# Patient Record
Sex: Female | Born: 1945 | Race: White | Hispanic: No | Marital: Married | Smoking: Never smoker
Health system: Southern US, Community
[De-identification: ages and names within clinical notes are randomized; demographics above are authoritative.]

## PROBLEM LIST (undated history)

## (undated) DIAGNOSIS — E039 Hypothyroidism, unspecified: Secondary | ICD-10-CM

## (undated) DIAGNOSIS — M3500 Sicca syndrome, unspecified: Secondary | ICD-10-CM

## (undated) DIAGNOSIS — H269 Unspecified cataract: Secondary | ICD-10-CM

## (undated) DIAGNOSIS — Z8619 Personal history of other infectious and parasitic diseases: Secondary | ICD-10-CM

## (undated) DIAGNOSIS — E785 Hyperlipidemia, unspecified: Secondary | ICD-10-CM

## (undated) DIAGNOSIS — M810 Age-related osteoporosis without current pathological fracture: Secondary | ICD-10-CM

## (undated) DIAGNOSIS — E559 Vitamin D deficiency, unspecified: Secondary | ICD-10-CM

## (undated) DIAGNOSIS — M1990 Unspecified osteoarthritis, unspecified site: Secondary | ICD-10-CM

## (undated) DIAGNOSIS — K5790 Diverticulosis of intestine, part unspecified, without perforation or abscess without bleeding: Secondary | ICD-10-CM

## (undated) DIAGNOSIS — K635 Polyp of colon: Secondary | ICD-10-CM

## (undated) DIAGNOSIS — M797 Fibromyalgia: Secondary | ICD-10-CM

## (undated) HISTORY — DX: Hyperlipidemia, unspecified: E78.5

## (undated) HISTORY — DX: Fibromyalgia: M79.7

## (undated) HISTORY — PX: TONSILLECTOMY AND ADENOIDECTOMY: SHX28

## (undated) HISTORY — DX: Vitamin D deficiency, unspecified: E55.9

## (undated) HISTORY — DX: Age-related osteoporosis without current pathological fracture: M81.0

## (undated) HISTORY — PX: CARPAL TUNNEL RELEASE: SHX101

## (undated) HISTORY — DX: Unspecified cataract: H26.9

## (undated) HISTORY — DX: Unspecified osteoarthritis, unspecified site: M19.90

## (undated) HISTORY — PX: TRIGGER FINGER RELEASE: SHX641

## (undated) HISTORY — DX: Personal history of other infectious and parasitic diseases: Z86.19

## (undated) HISTORY — DX: Sjogren syndrome, unspecified: M35.00

## (undated) HISTORY — DX: Polyp of colon: K63.5

## (undated) HISTORY — PX: BREAST BIOPSY: SHX20

## (undated) HISTORY — DX: Diverticulosis of intestine, part unspecified, without perforation or abscess without bleeding: K57.90

## (undated) HISTORY — DX: Hypothyroidism, unspecified: E03.9

---

## 2015-08-06 DIAGNOSIS — E559 Vitamin D deficiency, unspecified: Secondary | ICD-10-CM | POA: Insufficient documentation

## 2015-08-06 DIAGNOSIS — M810 Age-related osteoporosis without current pathological fracture: Secondary | ICD-10-CM | POA: Insufficient documentation

## 2015-11-06 LAB — HM COLONOSCOPY

## 2016-11-03 LAB — HM DEXA SCAN

## 2017-07-16 DIAGNOSIS — S2249XA Multiple fractures of ribs, unspecified side, initial encounter for closed fracture: Secondary | ICD-10-CM | POA: Insufficient documentation

## 2017-07-17 DIAGNOSIS — S0633AA Contusion and laceration of cerebrum, unspecified, with loss of consciousness status unknown, initial encounter: Secondary | ICD-10-CM | POA: Insufficient documentation

## 2017-07-17 DIAGNOSIS — S270XXA Traumatic pneumothorax, initial encounter: Secondary | ICD-10-CM | POA: Insufficient documentation

## 2017-07-20 DIAGNOSIS — S27321A Contusion of lung, unilateral, initial encounter: Secondary | ICD-10-CM | POA: Insufficient documentation

## 2017-12-22 LAB — HM MAMMOGRAPHY

## 2018-02-03 DIAGNOSIS — G894 Chronic pain syndrome: Secondary | ICD-10-CM | POA: Insufficient documentation

## 2019-02-14 LAB — HM MAMMOGRAPHY

## 2019-02-18 LAB — HM MAMMOGRAPHY

## 2019-06-13 LAB — HEPATIC FUNCTION PANEL
ALT: 8 (ref 7–35)
AST: 12 — AB (ref 13–35)
Alkaline Phosphatase: 68 (ref 25–125)
Bilirubin, Total: 0.5

## 2019-06-13 LAB — TSH: TSH: 2.86 (ref 0.41–5.90)

## 2019-06-13 LAB — BASIC METABOLIC PANEL
BUN: 11 (ref 4–21)
CO2: 30 — AB (ref 13–22)
Chloride: 104 (ref 99–108)
Creatinine: 0.8 (ref 0.5–1.1)
Glucose: 85
Potassium: 4.4 (ref 3.4–5.3)
Sodium: 141 (ref 137–147)

## 2019-06-13 LAB — COMPREHENSIVE METABOLIC PANEL
Calcium: 9.2 (ref 8.7–10.7)
GFR calc non Af Amer: 70

## 2019-06-13 LAB — LIPID PANEL
Cholesterol: 275 — AB (ref 0–200)
HDL: 56 (ref 35–70)
LDL Cholesterol: 171
Triglycerides: 286 — AB (ref 40–160)

## 2019-06-13 LAB — VITAMIN D 25 HYDROXY (VIT D DEFICIENCY, FRACTURES): Vit D, 25-Hydroxy: 56

## 2019-07-12 ENCOUNTER — Encounter: Payer: Self-pay | Admitting: Physician Assistant

## 2019-07-13 ENCOUNTER — Ambulatory Visit (INDEPENDENT_AMBULATORY_CARE_PROVIDER_SITE_OTHER): Payer: Medicare Other | Admitting: Physician Assistant

## 2019-07-13 ENCOUNTER — Other Ambulatory Visit: Payer: Self-pay

## 2019-07-13 ENCOUNTER — Encounter: Payer: Self-pay | Admitting: Physician Assistant

## 2019-07-13 ENCOUNTER — Telehealth: Payer: Self-pay | Admitting: Physician Assistant

## 2019-07-13 VITALS — BP 122/70 | HR 88 | Temp 98.1°F | Ht 61.5 in | Wt 127.2 lb

## 2019-07-13 DIAGNOSIS — N644 Mastodynia: Secondary | ICD-10-CM | POA: Diagnosis not present

## 2019-07-13 DIAGNOSIS — M1990 Unspecified osteoarthritis, unspecified site: Secondary | ICD-10-CM | POA: Insufficient documentation

## 2019-07-13 DIAGNOSIS — E785 Hyperlipidemia, unspecified: Secondary | ICD-10-CM | POA: Insufficient documentation

## 2019-07-13 DIAGNOSIS — E039 Hypothyroidism, unspecified: Secondary | ICD-10-CM | POA: Insufficient documentation

## 2019-07-13 DIAGNOSIS — K5790 Diverticulosis of intestine, part unspecified, without perforation or abscess without bleeding: Secondary | ICD-10-CM | POA: Insufficient documentation

## 2019-07-13 DIAGNOSIS — M810 Age-related osteoporosis without current pathological fracture: Secondary | ICD-10-CM | POA: Insufficient documentation

## 2019-07-13 NOTE — Patient Instructions (Signed)
It was great to see you!  You will be contacted about your referral to the breast center.   I hope to get your labs soon from your primary care doctor and will review them.  Let's follow-up after your breast imaging and figure out our next steps.  Take care,  Inda Coke PA-C

## 2019-07-13 NOTE — Telephone Encounter (Signed)
[place] HIM Dept faxed request for medical records to [facility] at [facility] 07/13/19  [facility]

## 2019-07-13 NOTE — Progress Notes (Signed)
Heather Hodges is a 74 y.o. female here to establish care.  I acted as a Education administrator for [employer], PA-C [employer], [place]  History of Present Illness:   Chief Complaint  Patient presents with  . New Patient (Initial Visit)   L breast pain In 2019, fell off a ladder. Broke ribs, had tension pneumothorax and "slight brain bleed." Has been getting regular mammograms, most recently in Nov 2020. States that since that time she had intermittent issues with breast pain but has increased over the past month. Worsening tenderness to L breast upper outer area with area of increased density (per patient report.) She states that her mother had dx of breast cancer. She also feels like her L nipple has increased in redness. Denies: nipple discharge, palpable lump.  Weight -- Weight: 127 lb 3.2 oz (57.7 kg)    Depression screen [facility] 2/9 07/13/2019  Decreased Interest 0  Down, Depressed, Hopeless 0  PHQ - 2 Score 0    No flowsheet data found.   Other providers/specialists: Patient Care Team: Inda Coke, [place] as PCP - General (Physician Assistant) Celene Squibb., MD as Referring Physician (Neurology)   Past Medical History:  Diagnosis Date  . Arthritis   . Colon polyps   . Diverticulosis   . History of chicken pox   . Hyperlipidemia   . Hypothyroidism   . Osteoporosis   . Vitamin D deficiency      Social History   Socioeconomic History  . Marital status: Married    Spouse name: Not on file  . Number of children: Not on file  . Years of education: Not on file  . Highest education level: Not on file  Occupational History  . Not on file  Tobacco Use  . Smoking status: Never Smoker  . Smokeless tobacco: Never Used  Substance and Sexual Activity  . Alcohol use: Never  . Drug use: Never  . Sexual activity: Not on file  Other Topics Concern  . Not on file  Social History Narrative   Married   Retired -- Aeronautical engineer and nanny   1 son -- lives in [place]   Social  Determinants of Health   Financial Resource Strain:   . Difficulty of Paying Living Expenses:   Food Insecurity:   . Worried About Charity fundraiser in the Last Year:   . Arboriculturist in the Last Year:   Transportation Needs:   . Film/video editor (Medical):   Marland Kitchen Lack of Transportation (Non-Medical):   Physical Activity:   . Days of Exercise per Week:   . Minutes of Exercise per Session:   Stress:   . Feeling of Stress :   Social Connections:   . Frequency of Communication with Friends and Family:   . Frequency of Social Gatherings with Friends and Family:   . Attends Religious Services:   . Active Member of Clubs or Organizations:   . Attends Archivist Meetings:   Marland Kitchen Marital Status:   Intimate Partner Violence:   . Fear of Current or Ex-Partner:   . Emotionally Abused:   Marland Kitchen Physically Abused:   . Sexually Abused:     Past Surgical History:  Procedure Laterality Date  . TONSILLECTOMY AND ADENOIDECTOMY      Family History  Problem Relation Age of Onset  . Arthritis Mother   . Breast cancer Mother   . High Cholesterol Mother   . Heart attack Father   .  High Cholesterol Father   . Arthritis Brother   . Hearing loss Brother   . Hypercholesterolemia Brother     Allergies  Allergen Reactions  . Risedronate Sodium Other (See Comments)    Patient does not remember reaction     Current Medications:   Current Outpatient Medications:  .  Cholecalciferol 50 MCG (2000 UT) TABS, Take by mouth., Disp: , Rfl:  .  gabapentin (NEURONTIN) 100 MG capsule, Take by mouth., Disp: , Rfl:  .  Garlic 123XX123 MG CAPS, Take by mouth., Disp: , Rfl:  .  levothyroxine (SYNTHROID) 75 MCG tablet, Take by mouth., Disp: , Rfl:  .  levothyroxine (SYNTHROID) 88 MCG tablet, Take by mouth., Disp: , Rfl:  .  lidocaine (XYLOCAINE) 4 % external solution, Apply topically., Disp: , Rfl:  .  Lidocaine 4 % PTCH, Place onto the skin., Disp: , Rfl:  .  pravastatin (PRAVACHOL) 40 MG  tablet, Take by mouth., Disp: , Rfl:  .  traMADol (ULTRAM) 50 MG tablet, Take by mouth., Disp: , Rfl:  .  acetaminophen (TYLENOL) 650 MG CR tablet, Take by mouth., Disp: , Rfl:  .  HYDROcodone-acetaminophen (NORCO/VICODIN) 5-325 MG tablet, TAKE 1 TABLET BY MOUTH EVERY 4 HOURS AS NEEDED FOR UP TO 5 DAYS, Disp: , Rfl:    Review of Systems:   ROS  Negative unless otherwise specified per HPI.  Vitals:   Vitals:   07/13/19 1027  BP: 122/70  Pulse: 88  Temp: 98.1 F (36.7 C)  TempSrc: Temporal  SpO2: 94%  Weight: 127 lb 3.2 oz (57.7 kg)  Height: 5' 1.5" (1.562 m)      Body mass index is 23.65 kg/m.  Physical Exam:   Physical Exam Vitals and nursing note reviewed.  Constitutional:      General: She is not in acute distress.    Appearance: She is well-developed. She is not ill-appearing or toxic-appearing.  Cardiovascular:     Rate and Rhythm: Normal rate and regular rhythm.     Pulses: Normal pulses.     Heart sounds: Normal heart sounds, S1 normal and S2 normal.  Pulmonary:     Effort: Pulmonary effort is normal.     Breath sounds: Normal breath sounds.  Chest:     Comments: TTP with area of density to 2 o'clock on L breast, no obvious lump or visible erythema noted to L nipple compared to R nipple Skin:    General: Skin is warm and dry.  Neurological:     Mental Status: She is alert.     GCS: GCS eye subscore is 4. GCS verbal subscore is 5. GCS motor subscore is 6.  Psychiatric:        Speech: Speech normal.        Behavior: Behavior normal. Behavior is cooperative.      Assessment and Plan:   [place] was seen today for new patient (initial visit).  Diagnoses and all orders for this visit:  Breast pain, left -     MM Digital Diagnostic Unilat L; Future   Due to worsening symptoms since mammogram and concern for family history of breast cancer will update her mammogram with a diagnostic mammogram today.  I have put this order in and we will proceed with  imaging.  Patient verbalized understanding and is in agreement to plan.  . Reviewed expectations re: course of current medical issues. . Discussed self-management of symptoms. . Outlined signs and symptoms indicating need for more acute intervention. . Patient  verbalized understanding and all questions were answered. . See orders for this visit as documented in the electronic medical record. . Patient received an After-Visit Summary.  CMA or LPN served as scribe during this visit. History, Physical, and Plan performed by medical provider. The above documentation has been reviewed and is accurate and complete.   Inda Coke, PA-C

## 2019-07-14 ENCOUNTER — Other Ambulatory Visit: Payer: Self-pay | Admitting: Physician Assistant

## 2019-07-14 DIAGNOSIS — N644 Mastodynia: Secondary | ICD-10-CM

## 2019-07-28 NOTE — Telephone Encounter (Signed)
[facility] HIM Dept received medical records from [facility]. Forwarding 257 pages to [place] location 07/28/19  [facility]

## 2019-08-03 ENCOUNTER — Ambulatory Visit
Admission: RE | Admit: 2019-08-03 | Discharge: 2019-08-03 | Disposition: A | Discharge status: Home / Self Care | Payer: Medicare Other | Source: Ambulatory Visit | Attending: Physician Assistant | Admitting: Physician Assistant

## 2019-08-03 ENCOUNTER — Other Ambulatory Visit: Payer: Self-pay

## 2019-08-03 ENCOUNTER — Encounter: Payer: Self-pay | Admitting: Physician Assistant

## 2019-08-03 DIAGNOSIS — N644 Mastodynia: Secondary | ICD-10-CM

## 2019-08-03 LAB — T4, FREE: T4,Free (Direct): 1

## 2019-08-03 IMAGING — US US BREAST*L* LIMITED INC AXILLA
1 series · 7 of 7 positions shown · non-contrast
Comparison: Previous exam(s).

CLINICAL DATA: Patient presents for palpable abnormality within the
upper-outer left breast.

EXAM:
DIGITAL DIAGNOSTIC LEFT MAMMOGRAM WITH CAD AND TOMO
ULTRASOUND LEFT BREAST

[Series 1: us breast*left* limited inc axilla · 0.06mm/px · 7 of 7 slices shown]
[im 1/7]
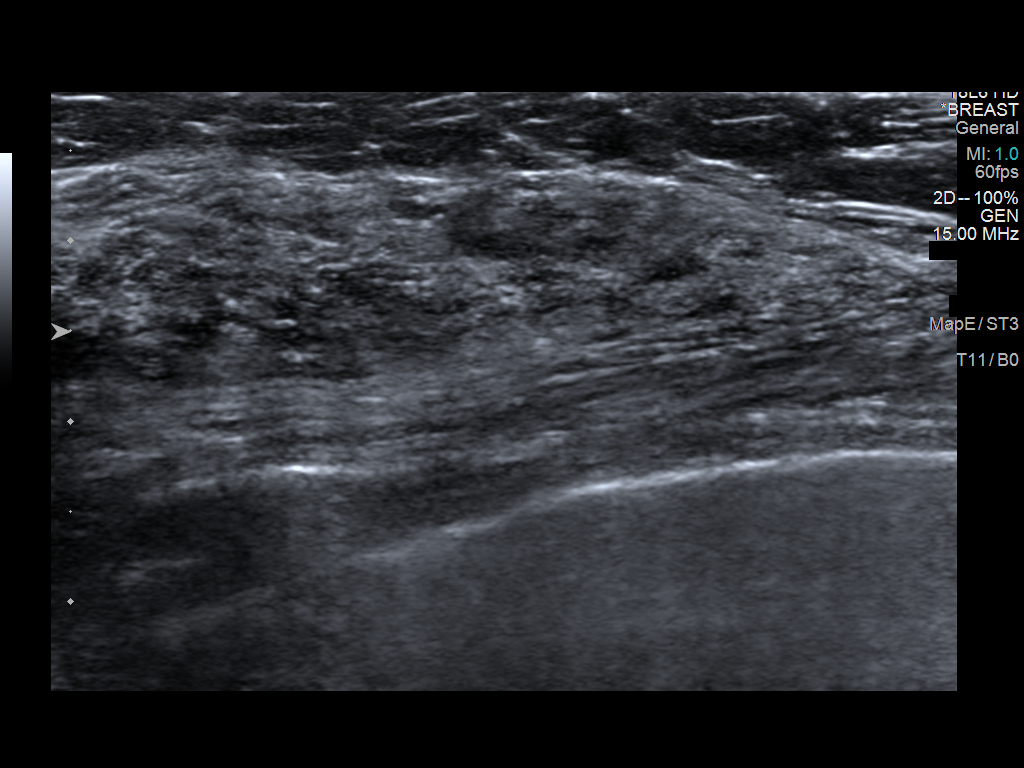
[im 2/7]
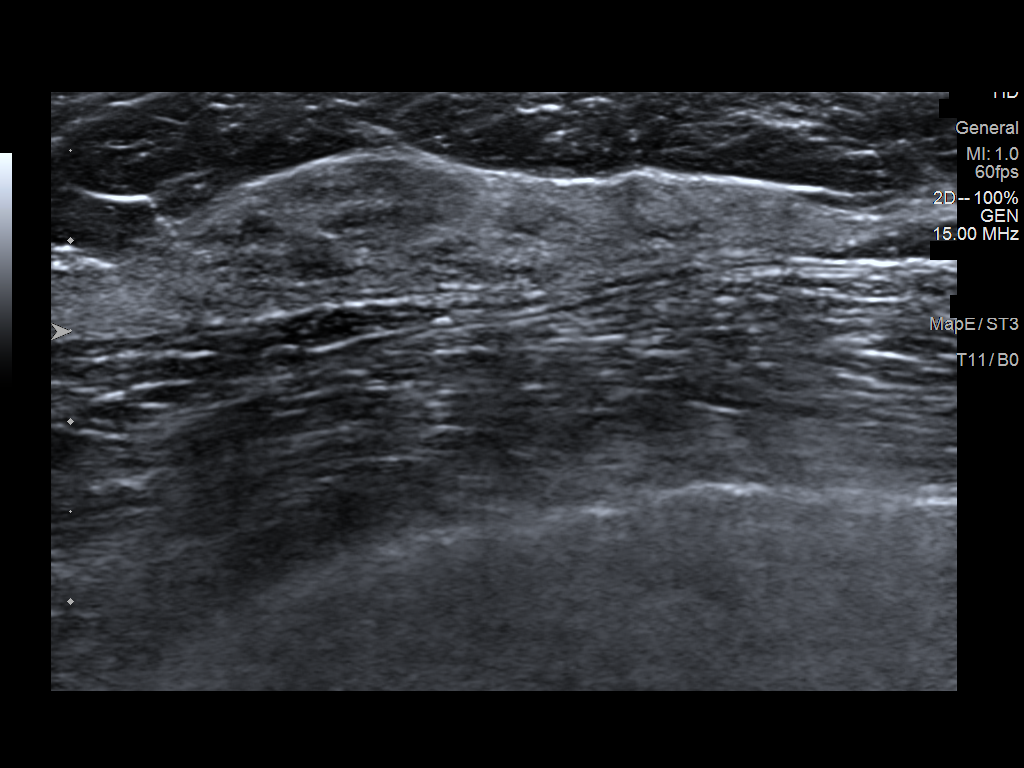
[im 3/7]
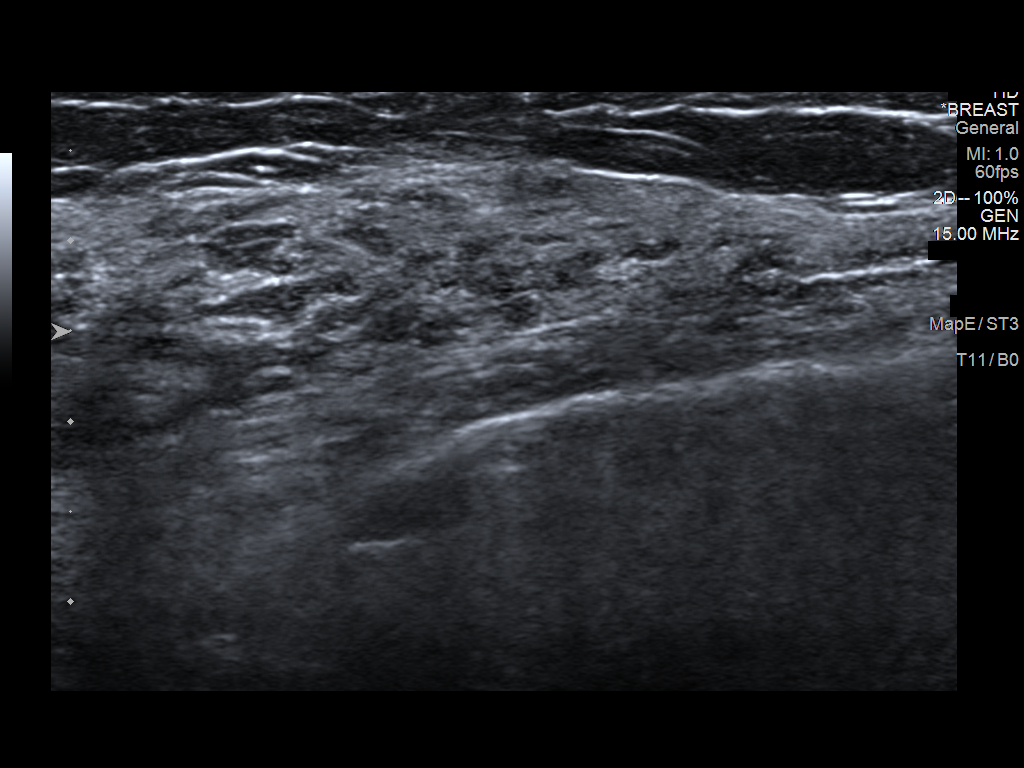
[im 4/7]
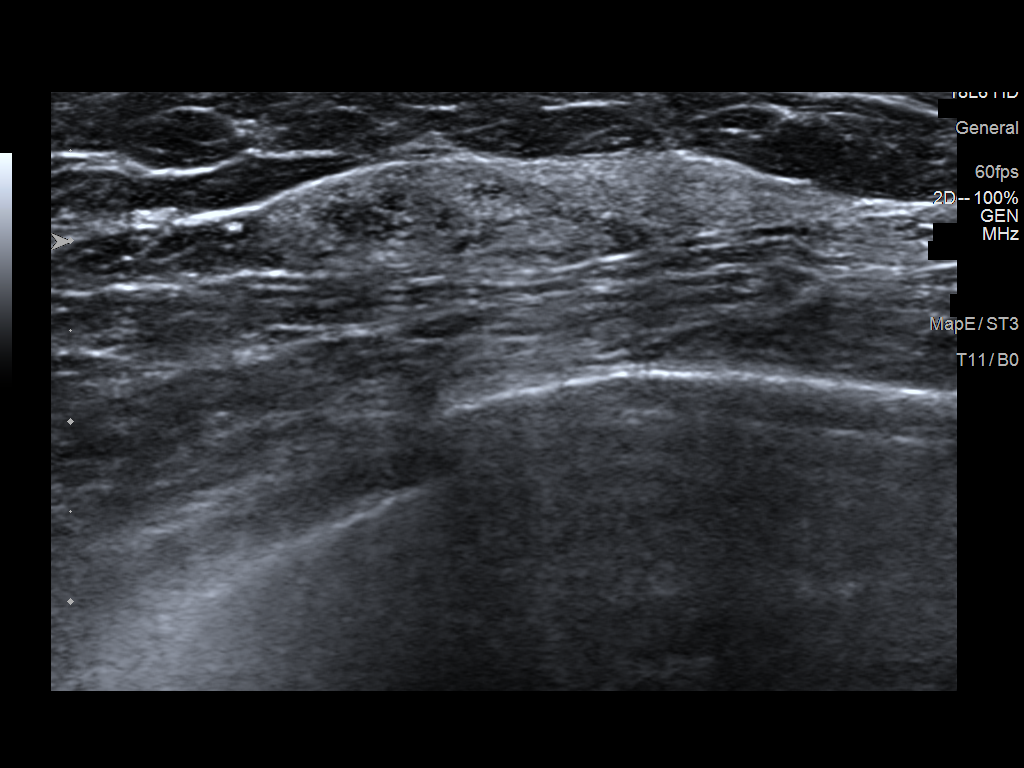
[im 5/7]
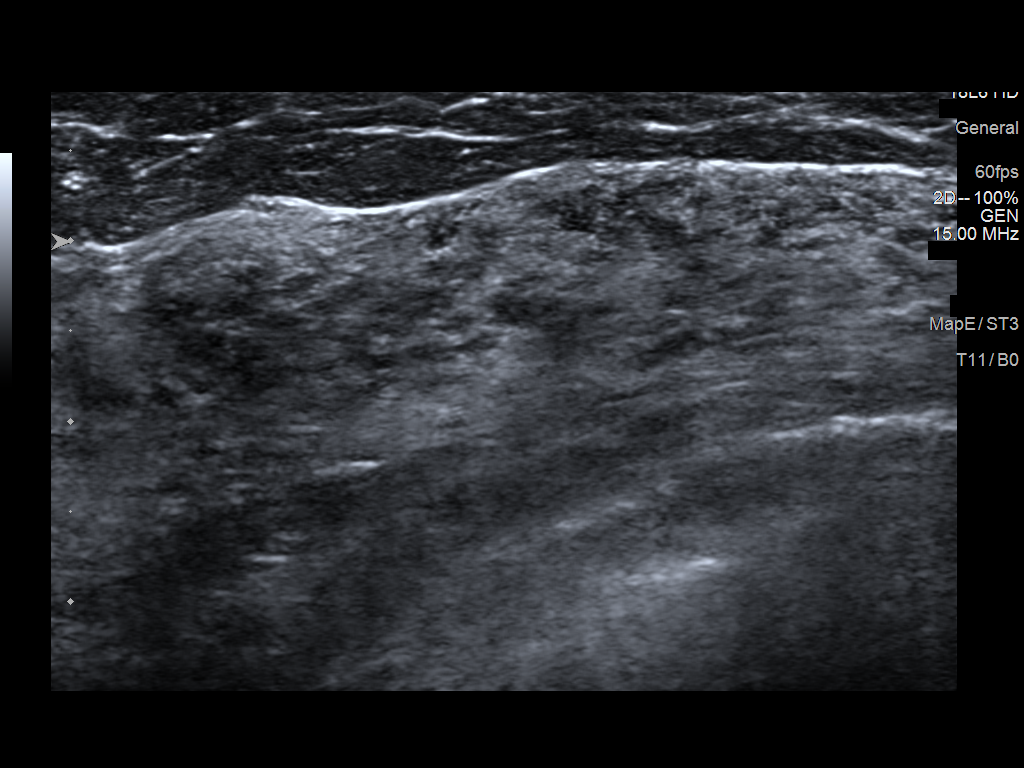
[im 6/7]
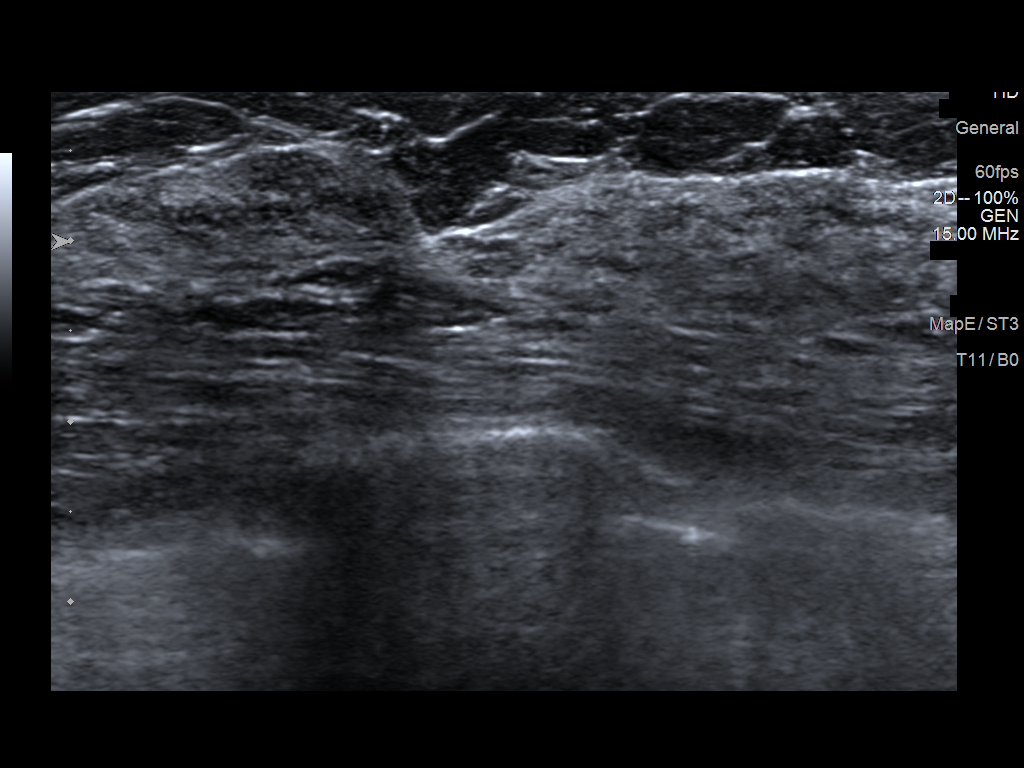
[im 7/7]
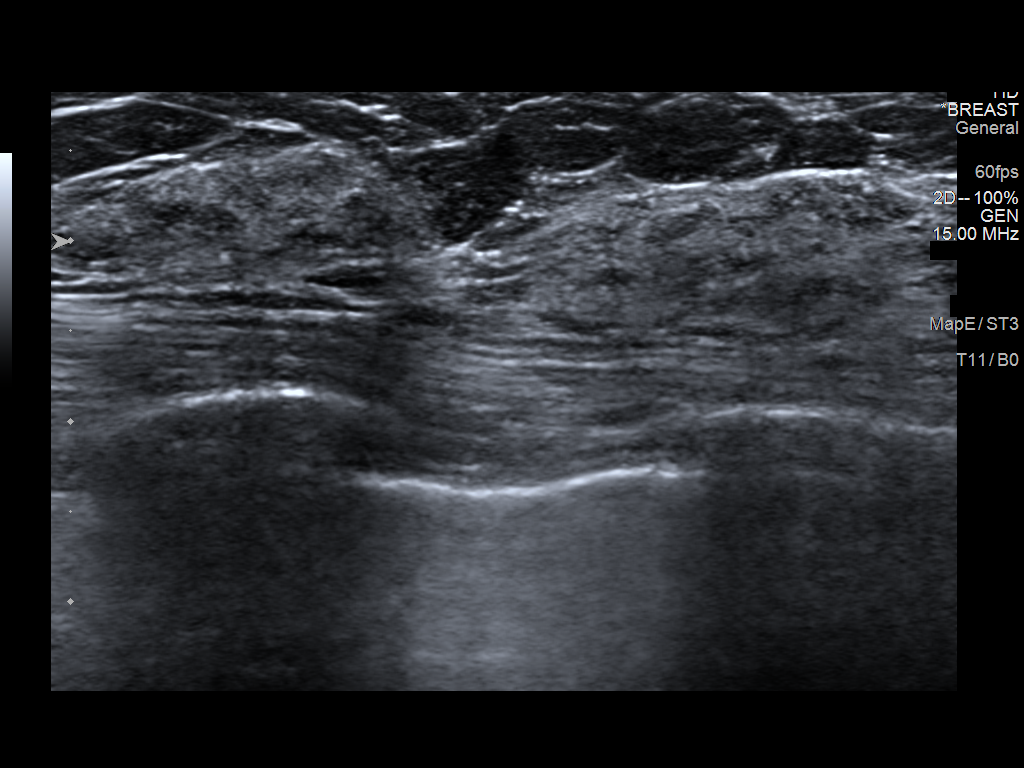

[7 of 7 positions shown; findings below may reference images not displayed]

ACR Breast Density Category c: The breast tissue is heterogeneously
dense, which may obscure small masses.
FINDINGS: No concerning masses, calcifications or nonsurgical distortion
identified within the left breast. No suspicious abnormality
underlying the palpable marker within the upper-outer left breast.

Mammographic images were processed with CAD.

On physical exam, dense tissue is palpated within upper-outer left
breast.

Targeted ultrasound is performed, showing normal dense tissue
without suspicious mass within the left breast 2 o'clock position 7
cm from nipple at the patient reported site of palpable concern.
IMPRESSION: No mammographic evidence for malignancy.

No suspicious abnormality at the site of palpable concern left
breast.

RECOMMENDATION:
Continued clinical evaluation for palpable abnormality left breast.

Return to annual screening mammography [DATE]

I have discussed the findings and recommendations with the patient.
If applicable, a reminder letter will be sent to the patient
regarding the next appointment.

BI-RADS CATEGORY  2: Benign.

## 2019-08-03 IMAGING — MG MM DIGITAL DIAGNOSTIC UNILAT*L* W/ TOMO W/ CAD
6 series · 6 of 18 positions shown · non-contrast
Comparison: Previous exam(s).

CLINICAL DATA: Patient presents for palpable abnormality within the
upper-outer left breast.

EXAM:
DIGITAL DIAGNOSTIC LEFT MAMMOGRAM WITH CAD AND TOMO
ULTRASOUND LEFT BREAST

[L CC synth-2D]
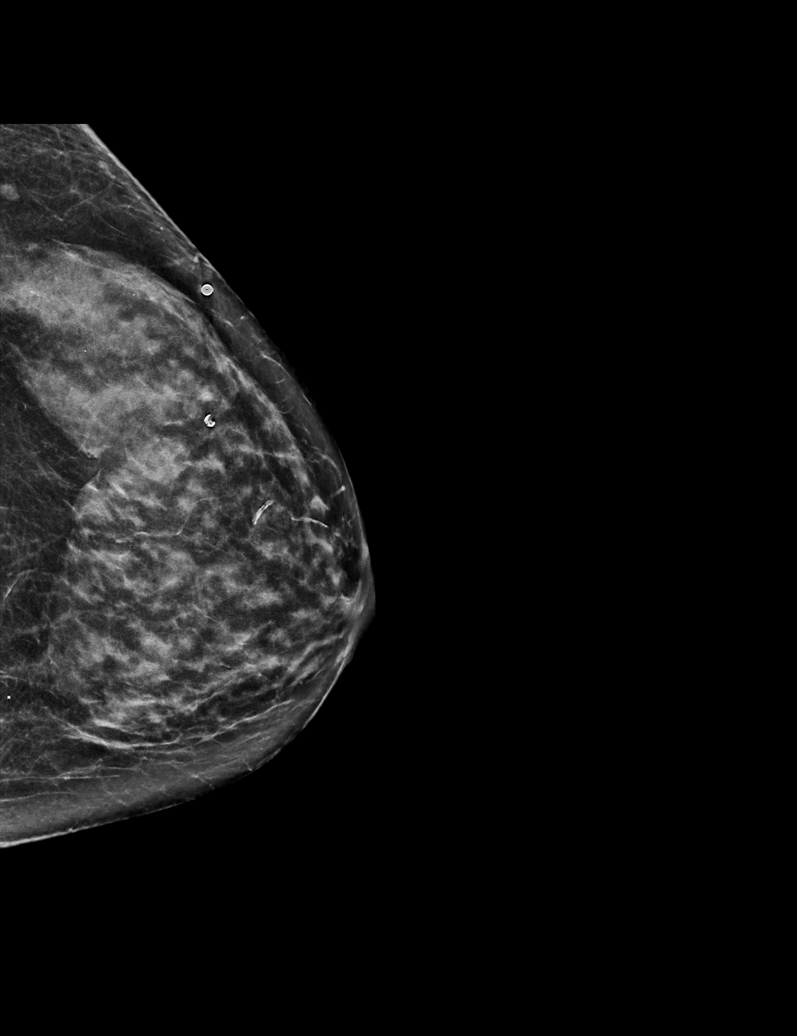

[L MLO synth-2D (1 of 2)]
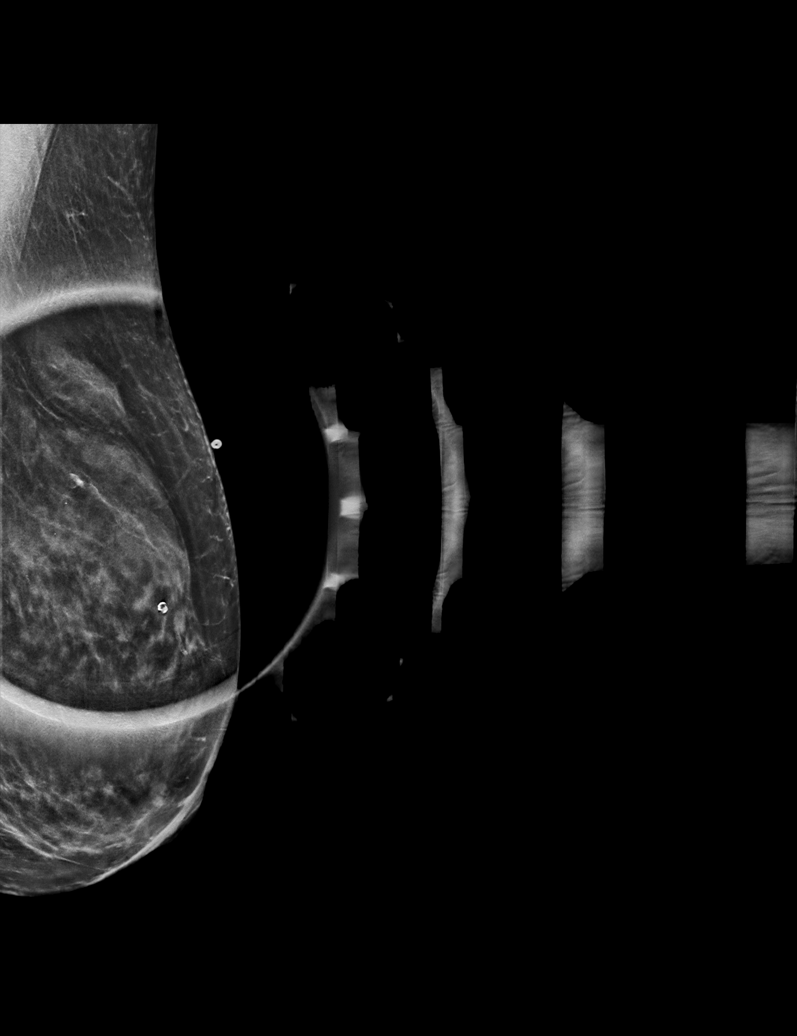

[L MLO synth-2D (2 of 2)]
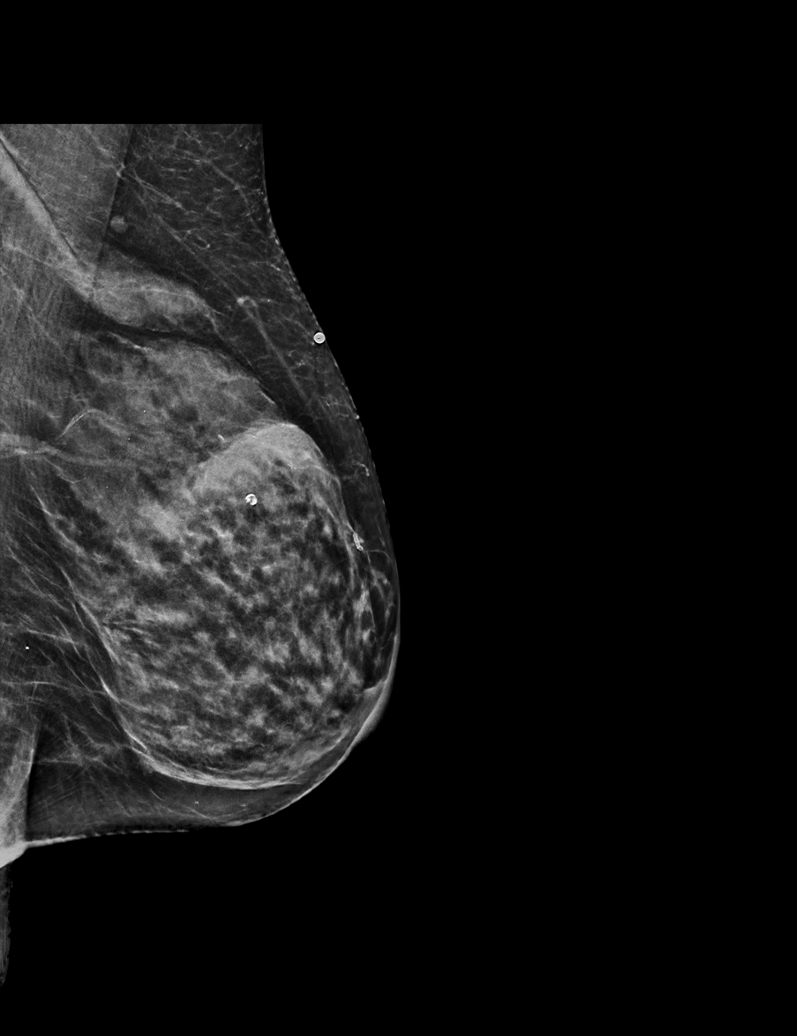

[L CC tomo · tomo slice 27/53.0]
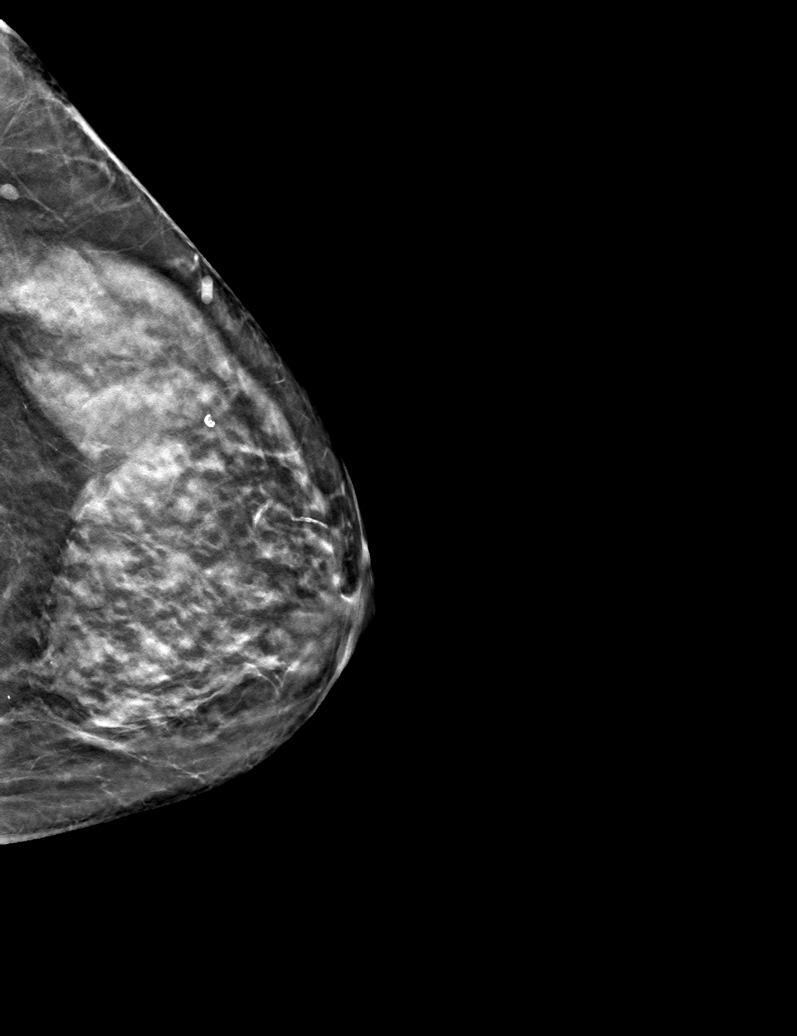

[L MLO tomo (1 of 2) · tomo slice 27/54.0]
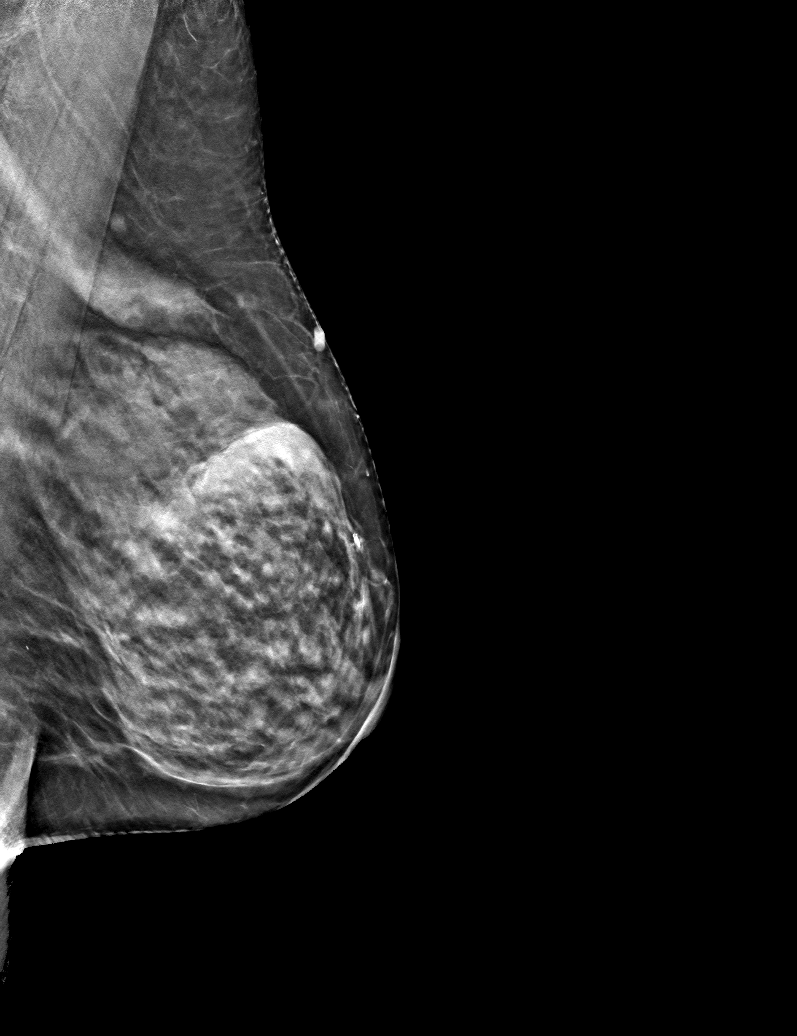

[L MLO tomo (2 of 2) · tomo slice 31/60.0]
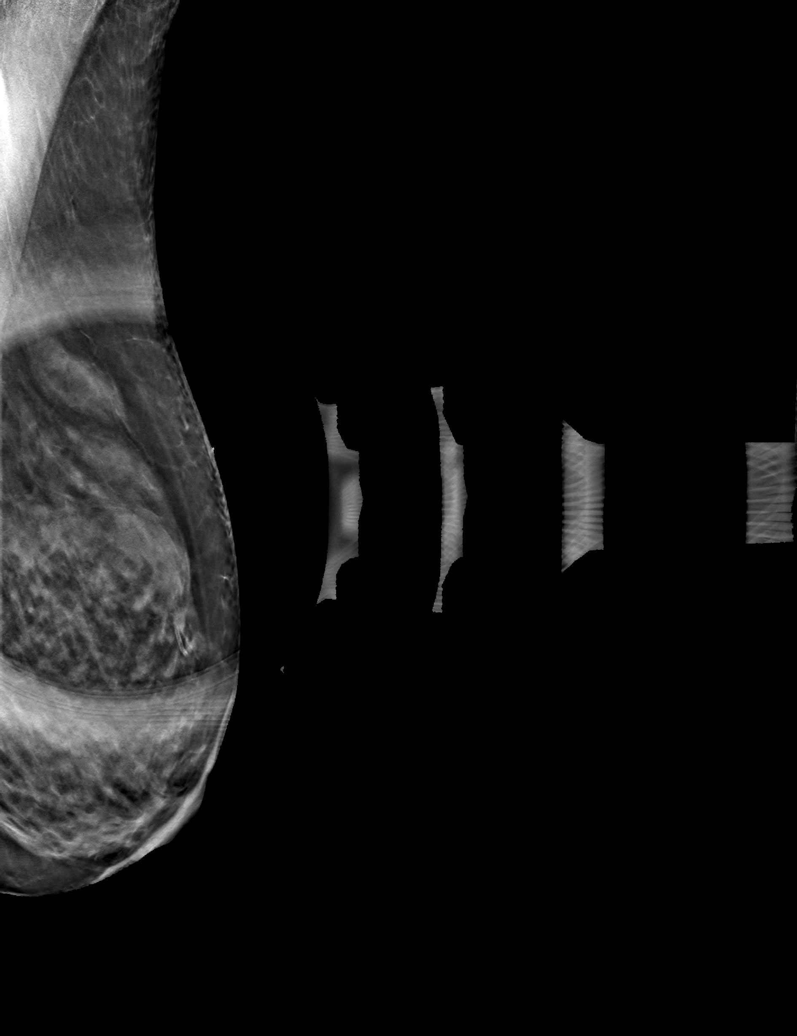

[6 of 18 positions shown; findings below may reference images not displayed]

ACR Breast Density Category c: The breast tissue is heterogeneously
dense, which may obscure small masses.
FINDINGS: No concerning masses, calcifications or nonsurgical distortion
identified within the left breast. No suspicious abnormality
underlying the palpable marker within the upper-outer left breast.

Mammographic images were processed with CAD.

On physical exam, dense tissue is palpated within upper-outer left
breast.

Targeted ultrasound is performed, showing normal dense tissue
without suspicious mass within the left breast 2 o'clock position 7
cm from nipple at the patient reported site of palpable concern.
IMPRESSION: No mammographic evidence for malignancy.

No suspicious abnormality at the site of palpable concern left
breast.

RECOMMENDATION:
Continued clinical evaluation for palpable abnormality left breast.

Return to annual screening mammography [DATE]

I have discussed the findings and recommendations with the patient.
If applicable, a reminder letter will be sent to the patient
regarding the next appointment.

BI-RADS CATEGORY  2: Benign.

## 2019-09-13 ENCOUNTER — Other Ambulatory Visit: Payer: Self-pay

## 2019-09-14 ENCOUNTER — Encounter: Payer: Self-pay | Admitting: Physician Assistant

## 2019-09-14 ENCOUNTER — Ambulatory Visit (INDEPENDENT_AMBULATORY_CARE_PROVIDER_SITE_OTHER): Payer: Medicare Other | Admitting: Physician Assistant

## 2019-09-14 VITALS — BP 110/72 | HR 72 | Temp 97.8°F | Ht 61.5 in | Wt 128.5 lb

## 2019-09-14 DIAGNOSIS — E039 Hypothyroidism, unspecified: Secondary | ICD-10-CM

## 2019-09-14 DIAGNOSIS — R0981 Nasal congestion: Secondary | ICD-10-CM | POA: Diagnosis not present

## 2019-09-14 DIAGNOSIS — E785 Hyperlipidemia, unspecified: Secondary | ICD-10-CM

## 2019-09-14 DIAGNOSIS — M2550 Pain in unspecified joint: Secondary | ICD-10-CM

## 2019-09-14 LAB — T4, FREE: Free T4: 1.04 ng/dL (ref 0.60–1.60)

## 2019-09-14 LAB — TSH: TSH: 2.24 u[IU]/mL (ref 0.35–4.50)

## 2019-09-14 LAB — COMPREHENSIVE METABOLIC PANEL
ALT: 12 U/L (ref 0–35)
AST: 16 U/L (ref 0–37)
Albumin: 4.3 g/dL (ref 3.5–5.2)
Alkaline Phosphatase: 68 U/L (ref 39–117)
BUN: 10 mg/dL (ref 6–23)
CO2: 31 mEq/L (ref 19–32)
Calcium: 9.1 mg/dL (ref 8.4–10.5)
Chloride: 103 mEq/L (ref 96–112)
Creatinine, Ser: 0.7 mg/dL (ref 0.40–1.20)
GFR: 81.52 mL/min (ref >60.00)
Glucose, Bld: 86 mg/dL (ref 70–99)
Potassium: 4.3 mEq/L (ref 3.5–5.1)
Sodium: 139 mEq/L (ref 135–145)
Total Bilirubin: 0.6 mg/dL (ref 0.2–1.2)
Total Protein: 7 g/dL (ref 6.0–8.3)

## 2019-09-14 LAB — LIPID PANEL
Cholesterol: 310 mg/dL — ABNORMAL HIGH (ref 0–200)
HDL: 59.2 mg/dL (ref >39.00)
NonHDL: 250.62
Total CHOL/HDL Ratio: 5
Triglycerides: 243 mg/dL — ABNORMAL HIGH (ref 0.0–149.0)
VLDL: 48.6 mg/dL — ABNORMAL HIGH (ref 0.0–40.0)

## 2019-09-14 LAB — LDL CHOLESTEROL, DIRECT: Direct LDL: 197 mg/dL

## 2019-09-14 NOTE — Patient Instructions (Signed)
It was great to see you!  1. We will call you with your results. I will also mail you the results so you have a record of them. 2. I will refill your thyroid and cholesterol medications after your lab results. 3. Start flonase nasal spray twice a day for your sinuses.  4. Call me when you are ready to see the sports medicine doctor about your back.  Let's follow-up in 3-6 months, sooner if you have concerns.  Take care,  Inda Coke PA-C

## 2019-09-14 NOTE — Progress Notes (Signed)
Heather Hodges is a 74 y.o. female is here to discuss:  I acted as a Education administrator for [employer], PA-C Anselmo Pickler, LPN   History of Present Illness:   Chief Complaint  Patient presents with  . Joint Pain    Pt is fasting for labs  . Medication Refill    HPI   Joint pain Pt c/o numerous areas of joint pain since had Mammogram 01/2019. L breast and chest wall surrounding L breast has been tender. Also in L upper arm and shoulder have also been sore. Most of of the pain however is in her upper back -- constant tenderness, worsens with certain movements. Denies: chest pain, SOB, palpitations, swelling in LE.  Has tried lidocaine patch, roll-on pain relief cream, capsaicin.  Sinus drainage Has had constant sinus drainage over the past few months. Does not take any medication for this. Gets PND. Denies: sore throat, ear fullness, fever, chills.  Hypothyroidism She is following up on this today. Currently taking synthroid 75 mcg and rotates 88 mcg. Current symptoms: none . Patient denies change in energy level, heat / cold intolerance, palpitations and weight changes. Symptoms have stabilized.  Wt Readings from Last 5 Encounters:  09/14/19 128 lb 8 oz (58.3 kg)  07/13/19 127 lb 3.2 oz (57.7 kg)   HLD Currently takes pravachol 40 mg daily. Needs updated labs. Tolerates this well without issues, denies myalgias.   There are no preventive care reminders to display for this patient.  Past Medical History:  Diagnosis Date  . Arthritis   . Colon polyps   . Diverticulosis   . History of chicken pox   . Hyperlipidemia   . Hypothyroidism   . Osteoporosis   . Vitamin D deficiency      Social History   Tobacco Use  . Smoking status: Never Smoker  . Smokeless tobacco: Never Used  Substance Use Topics  . Alcohol use: Never  . Drug use: Never    Past Surgical History:  Procedure Laterality Date  . TONSILLECTOMY AND ADENOIDECTOMY      Family History  Problem Relation  Age of Onset  . Arthritis Mother   . Breast cancer Mother   . High Cholesterol Mother   . Heart attack Father   . High Cholesterol Father   . Arthritis Brother   . Hearing loss Brother   . Hypercholesterolemia Brother     PMHx, SurgHx, SocialHx, FamHx, Medications, and Allergies were reviewed in the Visit Navigator and updated as appropriate.   Patient Active Problem List   Diagnosis Date Noted  . Osteoporosis   . Hypothyroidism   . Hyperlipidemia   . Diverticulosis   . Arthritis   . Chronic pain syndrome 02/03/2018  . Contusion of left lung 07/20/2017  . Cerebral contusion ([place]) 07/17/2017  . Traumatic pneumothorax 07/17/2017  . Rib fractures 07/16/2017  . Vitamin D deficiency 08/06/2015  . Osteoporosis without current pathological fracture 08/06/2015    Social History   Tobacco Use  . Smoking status: Never Smoker  . Smokeless tobacco: Never Used  Substance Use Topics  . Alcohol use: Never  . Drug use: Never    Current Medications and Allergies:    Current Outpatient Medications:  .  acetaminophen (TYLENOL) 650 MG CR tablet, Take by mouth., Disp: , Rfl:  .  Cholecalciferol 50 MCG (2000 UT) TABS, Take by mouth., Disp: , Rfl:  .  gabapentin (NEURONTIN) 100 MG capsule, Take 100 mg by mouth 3 (three) times daily. ,  Disp: , Rfl:  .  Garlic 8338 MG CAPS, Take by mouth., Disp: , Rfl:  .  levothyroxine (SYNTHROID) 75 MCG tablet, Take 75 mcg by mouth every other day. , Disp: , Rfl:  .  levothyroxine (SYNTHROID) 88 MCG tablet, Take 88 mcg by mouth every other day. , Disp: , Rfl:  .  lidocaine (XYLOCAINE) 4 % external solution, Apply topically., Disp: , Rfl:  .  Lidocaine 4 % PTCH, Place onto the skin., Disp: , Rfl:  .  pravastatin (PRAVACHOL) 40 MG tablet, Take by mouth., Disp: , Rfl:  .  traMADol (ULTRAM) 50 MG tablet, Take by mouth., Disp: , Rfl:    Allergies  Allergen Reactions  . Risedronate Sodium Other (See Comments)    Patient does not remember reaction     Review of Systems   ROS Negative unless otherwise specified per HPI.  Vitals:   Vitals:   09/14/19 0857  BP: 110/72  Pulse: 72  Temp: 97.8 F (36.6 C)  TempSrc: Temporal  SpO2: 97%  Weight: 128 lb 8 oz (58.3 kg)  Height: 5' 1.5" (1.562 m)     Body mass index is 23.89 kg/m.   Physical Exam:    Physical Exam Vitals and nursing note reviewed.  Constitutional:      General: She is not in acute distress.    Appearance: She is well-developed. She is not ill-appearing or toxic-appearing.  HENT:     Head: Normocephalic and atraumatic.     Right Ear: Tympanic membrane, ear canal and external ear normal. Tympanic membrane is not erythematous, retracted or bulging.     Left Ear: Tympanic membrane, ear canal and external ear normal. Tympanic membrane is not erythematous, retracted or bulging.     Nose: Nose normal.     Right Sinus: No maxillary sinus tenderness or frontal sinus tenderness.     Left Sinus: No maxillary sinus tenderness or frontal sinus tenderness.     Mouth/Throat:     Pharynx: Uvula midline. No posterior oropharyngeal erythema.  Eyes:     General: Lids are normal.     Conjunctiva/sclera: Conjunctivae normal.  Neck:     Trachea: Trachea normal.  Cardiovascular:     Rate and Rhythm: Normal rate and regular rhythm.     Pulses: Normal pulses.     Heart sounds: Normal heart sounds, S1 normal and S2 normal.     Comments: No LE edema Pulmonary:     Effort: Pulmonary effort is normal.     Breath sounds: Normal breath sounds. No decreased breath sounds, wheezing, rhonchi or rales.  Musculoskeletal:     Comments: Slight TTP to L thoracic paraspinal muscles Normal ROM  Lymphadenopathy:     Cervical: No cervical adenopathy.  Skin:    General: Skin is warm and dry.  Neurological:     Mental Status: She is alert.     GCS: GCS eye subscore is 4. GCS verbal subscore is 5. GCS motor subscore is 6.  Psychiatric:        Speech: Speech normal.        Behavior:  Behavior normal. Behavior is cooperative.      Assessment and Plan:    Heather Hodges was seen today for joint pain and medication refill.  Diagnoses and all orders for this visit:  Arthralgia, unspecified joint Patient already sees pain management, but she is interested in possible sports medicine referral for further evaluation of what is causing her symptoms -- but not for pain medication. She  is traveling around this month and would like to wait on appointment until she returns. No red flags.  Acquired hypothyroidism Suspect euthyroid but will update labs and refill as appropriate. -     TSH -     T4, free  Hyperlipidemia, unspecified hyperlipidemia type Update lipid panel and adjust statin prn. -     Lipid panel -     Comprehensive metabolic panel  Sinus congestion No red flags on discussion. Start flonase to help with congestion and suspected ETD. Follow-up if no improvement of symptoms.   . Reviewed expectations re: course of current medical issues. . Discussed self-management of symptoms. . Outlined signs and symptoms indicating need for more acute intervention. . Patient verbalized understanding and all questions were answered. . See orders for this visit as documented in the electronic medical record. . Patient received an After Visit Summary.  CMA or LPN served as scribe during this visit. History, Physical, and Plan performed by medical provider. The above documentation has been reviewed and is accurate and complete.  Inda Coke, PA-C [place], [place] 09/14/2019  Follow-up: No follow-ups on file.

## 2019-09-15 ENCOUNTER — Other Ambulatory Visit: Payer: Self-pay | Admitting: Physician Assistant

## 2019-09-15 MED ORDER — ATORVASTATIN CALCIUM 40 MG PO TABS
40.0000 mg | ORAL_TABLET | Freq: Every day | ORAL | 1 refills | Status: DC
Start: 2019-09-15 — End: 2020-04-30

## 2019-09-15 MED ORDER — LEVOTHYROXINE SODIUM 75 MCG PO TABS: 75.0000 ug | ORAL_TABLET | ORAL | 1 refills | Status: DC

## 2019-09-15 MED ORDER — LEVOTHYROXINE SODIUM 88 MCG PO TABS: 88.0000 ug | ORAL_TABLET | ORAL | 1 refills | Status: DC

## 2019-09-16 ENCOUNTER — Telehealth: Payer: Self-pay | Admitting: Physician Assistant

## 2019-09-16 MED ORDER — FLUTICASONE PROPIONATE 50 MCG/ACT NA SUSP
2.0000 | Freq: Every day | NASAL | 1 refills | Status: DC
Start: 2019-09-16 — End: 2020-06-05

## 2019-09-16 NOTE — Telephone Encounter (Signed)
Spoke to pt told her Heather Hodges forgot to send Rx for Flonase so I sent it in now. Pt verbalized understanding.

## 2019-09-16 NOTE — Telephone Encounter (Signed)
Patient called in and said she could not find Flonase at the drug store she was at but did find [place] and is wondering if Heather Hodges is okay with this.

## 2019-10-12 ENCOUNTER — Telehealth: Payer: Self-pay | Admitting: Physician Assistant

## 2019-10-12 NOTE — Telephone Encounter (Signed)
Left message for patient to call back and schedule Medicare Annual Wellness Visit (AWV) either virtually/audio only OR in office. Whatever the patients preference is.  [id number] PER [place]; please schedule at anytime with [facility]-Nurse Health Advisor at [facility].  This should be a 45 minute visit.

## 2019-12-23 ENCOUNTER — Encounter: Payer: Self-pay | Admitting: Physician Assistant

## 2019-12-23 ENCOUNTER — Ambulatory Visit (INDEPENDENT_AMBULATORY_CARE_PROVIDER_SITE_OTHER): Payer: Medicare Other | Admitting: Physician Assistant

## 2019-12-23 ENCOUNTER — Other Ambulatory Visit: Payer: Self-pay

## 2019-12-23 VITALS — BP 122/76 | HR 78 | Temp 98.0°F | Ht 61.5 in | Wt 129.5 lb

## 2019-12-23 DIAGNOSIS — Z23 Encounter for immunization: Secondary | ICD-10-CM

## 2019-12-23 DIAGNOSIS — N649 Disorder of breast, unspecified: Secondary | ICD-10-CM

## 2019-12-23 DIAGNOSIS — M810 Age-related osteoporosis without current pathological fracture: Secondary | ICD-10-CM | POA: Diagnosis not present

## 2019-12-23 DIAGNOSIS — E559 Vitamin D deficiency, unspecified: Secondary | ICD-10-CM | POA: Diagnosis not present

## 2019-12-23 DIAGNOSIS — M1990 Unspecified osteoarthritis, unspecified site: Secondary | ICD-10-CM

## 2019-12-23 NOTE — Progress Notes (Signed)
Heather Hodges is a 74 y.o. female is here to discuss: Bone scan  I acted as a Education administrator for [employer], PA-C Anselmo Pickler, LPN   History of Present Illness:   Chief Complaint  Patient presents with  . Discuss test result    HPI   Osteoporosis Pt is here to discuss Bone scan done 9/13.  Per radiologist Moser "Bone scan shows some arthritis of the spine but as a result there is some activity in the left ribs where you have your discomfort the radiologist thought it was more of a chronic rib fracture. You do have a history of osteoporosis this may need to be more aggressively treated to see if that will promote is healing primary care doctor is her endocrinologist usually deal with this problem." She does have allergy history to risedronate sodium, states that it causes jaw pain.  She currently takes Calcium 500 mg and Vitamin D 2000 IS supplements.  L nipple pain She noticed a nodule on her L nipple yesterday. Area is very painful to touch. The pain causes a shock to go through to her back when she touches the area. Denies discharge from nipple, crusting, or redness. Normal mammogram of L breast in May 2021.  Arthritis Was told that she has arthritis throughout her body, found on her bone scan. She is wondering if this is why she hurts and is achy all the time. Does take tylenol with little relief. She states that she cannot take ibuprofen due to one of the medications she is on but cannot tell me which one.    There are no preventive care reminders to display for this patient.  Past Medical History:  Diagnosis Date  . Arthritis   . Colon polyps   . Diverticulosis   . History of chicken pox   . Hyperlipidemia   . Hypothyroidism   . Osteoporosis   . Vitamin D deficiency      Social History   Tobacco Use  . Smoking status: Never Smoker  . Smokeless tobacco: Never Used  Substance Use Topics  . Alcohol use: Never  . Drug use: Never    Past Surgical History:    Procedure Laterality Date  . TONSILLECTOMY AND ADENOIDECTOMY      Family History  Problem Relation Age of Onset  . Arthritis Mother   . Breast cancer Mother   . High Cholesterol Mother   . Heart attack Father   . High Cholesterol Father   . Arthritis Brother   . Hearing loss Brother   . Hypercholesterolemia Brother     PMHx, SurgHx, SocialHx, FamHx, Medications, and Allergies were reviewed in the Visit Navigator and updated as appropriate.   Patient Active Problem List   Diagnosis Date Noted  . Osteoporosis   . Hypothyroidism   . Hyperlipidemia   . Diverticulosis   . Arthritis   . Chronic pain syndrome 02/03/2018  . Contusion of left lung 07/20/2017  . Cerebral contusion ([place]) 07/17/2017  . Traumatic pneumothorax 07/17/2017  . Rib fractures 07/16/2017  . Vitamin D deficiency 08/06/2015  . Osteoporosis without current pathological fracture 08/06/2015    Social History   Tobacco Use  . Smoking status: Never Smoker  . Smokeless tobacco: Never Used  Substance Use Topics  . Alcohol use: Never  . Drug use: Never    Current Medications and Allergies:    Current Outpatient Medications:  .  acetaminophen (TYLENOL) 650 MG CR tablet, Take by mouth., Disp: , Rfl:  .  atorvastatin (LIPITOR) 40 MG tablet, Take 1 tablet (40 mg total) by mouth daily., Disp: 90 tablet, Rfl: 1 .  b complex vitamins capsule, Take 1 capsule by mouth every other day., Disp: , Rfl:  .  Calcium Carbonate-Vitamin D (CALCIUM 500+D PO), Take 2 each by mouth every other day., Disp: , Rfl:  .  Cholecalciferol 50 MCG (2000 UT) TABS, Take by mouth., Disp: , Rfl:  .  gabapentin (NEURONTIN) 100 MG capsule, Take 100 mg by mouth 3 (three) times daily. , Disp: , Rfl:  .  Garlic 6945 MG CAPS, Take by mouth., Disp: , Rfl:  .  levothyroxine (SYNTHROID) 75 MCG tablet, Take 1 tablet (75 mcg total) by mouth every other day., Disp: 90 tablet, Rfl: 1 .  levothyroxine (SYNTHROID) 88 MCG tablet, Take 1 tablet (88 mcg  total) by mouth every other day., Disp: 90 tablet, Rfl: 1 .  lidocaine (XYLOCAINE) 4 % external solution, Apply topically., Disp: , Rfl:  .  Lidocaine 4 % PTCH, Place onto the skin., Disp: , Rfl:  .  traMADol (ULTRAM) 50 MG tablet, Take by mouth., Disp: , Rfl:  .  fluticasone (FLONASE) 50 MCG/ACT nasal spray, Place 2 sprays into both nostrils daily. (Patient not taking: Reported on 12/23/2019), Disp: 16 g, Rfl: 1   Allergies  Allergen Reactions  . Risedronate Sodium Other (See Comments)    TMJ dysfunction    Review of Systems   ROS  Negative unless otherwise specified per HPI.   Vitals:   Vitals:   12/23/19 1443  BP: 122/76  Pulse: 78  Temp: 98 F (36.7 C)  TempSrc: Temporal  SpO2: 95%  Weight: 129 lb 8 oz (58.7 kg)  Height: 5' 1.5" (1.562 m)     Body mass index is 24.07 kg/m.   Physical Exam:    Physical Exam Vitals and nursing note reviewed.  Constitutional:      General: She is not in acute distress.    Appearance: She is well-developed. She is not ill-appearing or toxic-appearing.  Cardiovascular:     Rate and Rhythm: Normal rate and regular rhythm.     Pulses: Normal pulses.     Heart sounds: Normal heart sounds, S1 normal and S2 normal.     Comments: No LE edema Pulmonary:     Effort: Pulmonary effort is normal.     Breath sounds: Normal breath sounds.  Chest:     Comments: Left nipple with firm raised lesion with severe TTP, approximately 2 mm in diameter, no significant erythema or discharge Skin:    General: Skin is warm and dry.  Neurological:     Mental Status: She is alert.     GCS: GCS eye subscore is 4. GCS verbal subscore is 5. GCS motor subscore is 6.  Psychiatric:        Speech: Speech normal.        Behavior: Behavior normal. Behavior is cooperative.      Assessment and Plan:    Heather Hodges was seen today for discuss test result.  Diagnoses and all orders for this visit:  Osteoporosis, unspecified osteoporosis type, unspecified  pathological fracture presence Update DEXA scan and CMP. Continue vitamin D and calcium. Will trial prolia if agreeable. -     DG Bone Density; Future -     Comprehensive metabolic panel; Future -     Comprehensive metabolic panel  Vitamin D deficiency Update Vit D level and provide recommendations accordingly. -     VITAMIN D 25  Hydroxy (Vit-D Deficiency, Fractures); Future -     VITAMIN D 25 Hydroxy (Vit-D Deficiency, Fractures)  Need for immunization against influenza -     Flu Vaccine QUAD High Dose(Fluad)  Arthritis Symptoms overall uncontrolled. Recommended trialing low dose of ibuprofen to see if this will provide better relief than tylenol. Continue to try to be active. Follow-up if symptoms worsen/change.  Nipple lesion Does not appear to be infected. Will order u/s for further evaluation. -     US BREAST LTD UNI LEFT INC AXILLA; Future  CMA or LPN served as scribe during this visit. History, Physical, and Plan performed by medical provider. The above documentation has been reviewed and is accurate and complete.   Inda Coke, PA-C [place] 12/23/2019  Follow-up: No follow-ups on file.

## 2019-12-23 NOTE — Patient Instructions (Addendum)
It was great to see you!  Trial ibuprofen -- 400 to 800 mg every 8 hours -- for your pain.  Please schedule your DEXA bone density scan on your way out. We are also updating your labs today. We are going to consider Prolia for your osteoporosis. These are injections and we can   Lets get a non-urgent ultrasound of the lesion on your left nipple. You will be contacted about scheduling this.  Results from Bone Scan: Patient has history ofLEFT side pain for 2 years, prior fall from a ladder, recent x-rays, thoracic spine fractures of T3 and T6 by radiographs, multiple LEFT rib fractures by radiographs  Arthritis in shoulders, wrists, hips, RIGHT knee, and RIGHT foot, typically degenerative.  Arthritis in spine.  Area showing chronically healed left rib fracture    Take care,  Inda Coke PA-C

## 2019-12-24 LAB — COMPREHENSIVE METABOLIC PANEL
AG Ratio: 1.5 (calc) (ref 1.0–2.5)
ALT: 14 U/L (ref 6–29)
AST: 18 U/L (ref 10–35)
Albumin: 4.2 g/dL (ref 3.6–5.1)
Alkaline phosphatase (APISO): 75 U/L (ref 37–153)
BUN: 9 mg/dL (ref 7–25)
CO2: 29 mmol/L (ref 20–32)
Calcium: 9.3 mg/dL (ref 8.6–10.4)
Chloride: 102 mmol/L (ref 98–110)
Creat: 0.74 mg/dL (ref 0.60–0.93)
Globulin: 2.8 g/dL (calc) (ref 1.9–3.7)
Glucose, Bld: 88 mg/dL (ref 65–99)
Potassium: 4.2 mmol/L (ref 3.5–5.3)
Sodium: 138 mmol/L (ref 135–146)
Total Bilirubin: 0.5 mg/dL (ref 0.2–1.2)
Total Protein: 7 g/dL (ref 6.1–8.1)

## 2019-12-24 LAB — VITAMIN D 25 HYDROXY (VIT D DEFICIENCY, FRACTURES): Vit D, 25-Hydroxy: 49 ng/mL (ref 30–100)

## 2019-12-28 ENCOUNTER — Other Ambulatory Visit: Payer: Self-pay | Admitting: Physician Assistant

## 2019-12-28 DIAGNOSIS — N649 Disorder of breast, unspecified: Secondary | ICD-10-CM

## 2019-12-29 ENCOUNTER — Telehealth: Payer: Self-pay

## 2019-12-29 ENCOUNTER — Ambulatory Visit (INDEPENDENT_AMBULATORY_CARE_PROVIDER_SITE_OTHER)
Admission: RE | Admit: 2019-12-29 | Discharge: 2019-12-29 | Disposition: A | Discharge status: Home / Self Care | Payer: Medicare Other | Source: Ambulatory Visit | Attending: Physician Assistant | Admitting: Physician Assistant

## 2019-12-29 ENCOUNTER — Other Ambulatory Visit: Payer: Self-pay

## 2019-12-29 DIAGNOSIS — M810 Age-related osteoporosis without current pathological fracture: Secondary | ICD-10-CM | POA: Diagnosis not present

## 2019-12-29 NOTE — Telephone Encounter (Signed)
Pt is requesting that her most recent labs be mailed to her home, as well as her bone density scan when it comes

## 2019-12-29 NOTE — Telephone Encounter (Signed)
See result notes. 

## 2020-01-24 ENCOUNTER — Ambulatory Visit
Admission: RE | Admit: 2020-01-24 | Discharge: 2020-01-24 | Disposition: A | Discharge status: Home / Self Care | Payer: Medicare Other | Source: Ambulatory Visit | Attending: Physician Assistant | Admitting: Physician Assistant

## 2020-01-24 ENCOUNTER — Other Ambulatory Visit: Payer: Self-pay

## 2020-01-24 DIAGNOSIS — N649 Disorder of breast, unspecified: Secondary | ICD-10-CM

## 2020-01-24 IMAGING — MG MM DIGITAL DIAGNOSTIC UNILAT*L* W/ TOMO W/ CAD
4 series · 4 of 12 positions shown · non-contrast
Comparison: Previous exam(s).
COMPARISON: Previous exam(s).

Addendum:
CLINICAL DATA: Patient presents with a mildly painful bump on the
left nipple.

EXAM:
DIGITAL DIAGNOSTIC UNILATERAL LEFT MAMMOGRAM WITH TOMO AND CAD

[L MLO synth-2D]
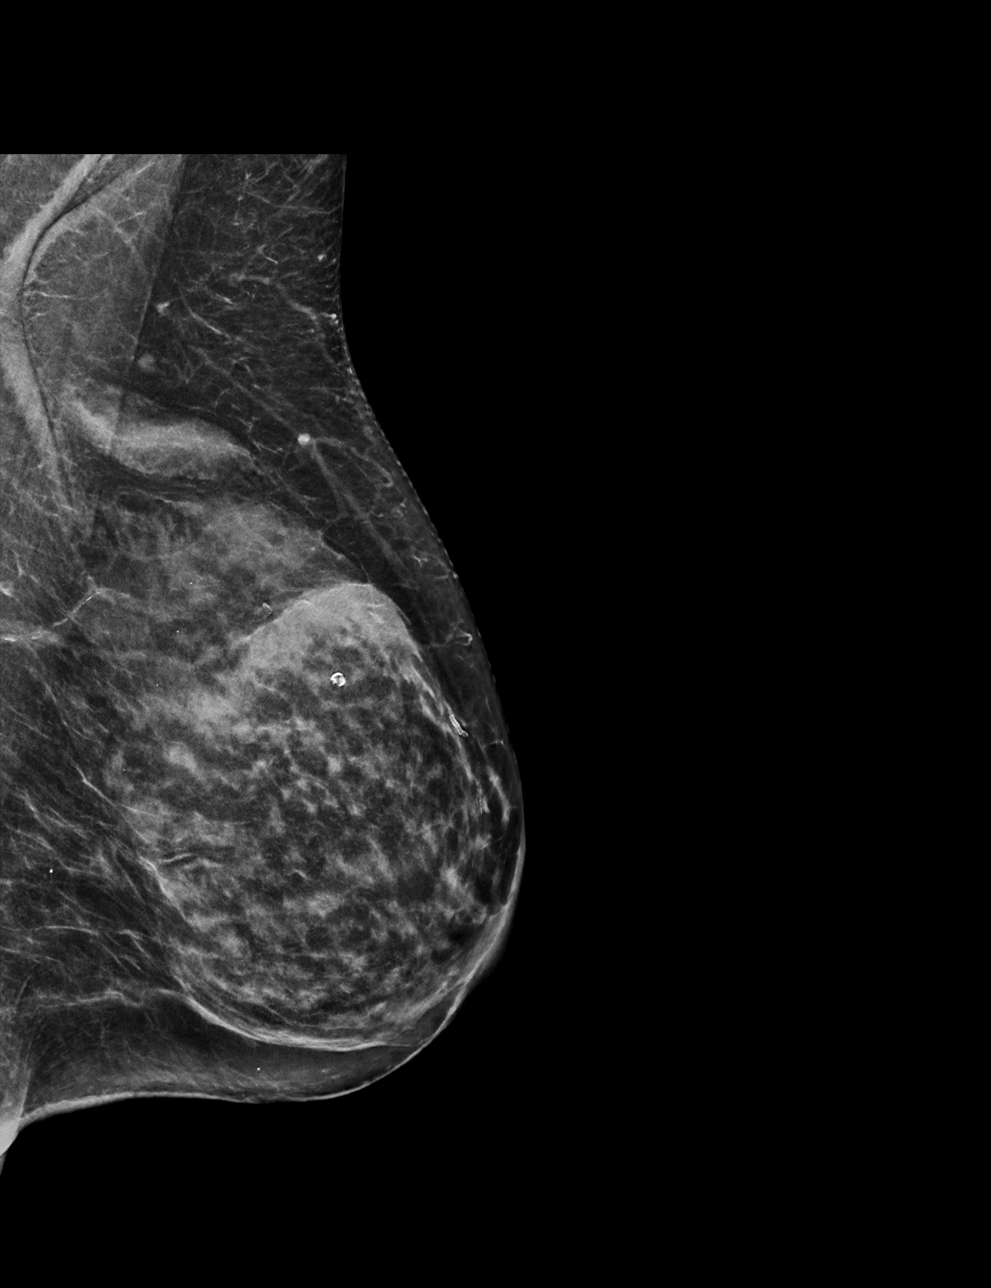

[L CC synth-2D]
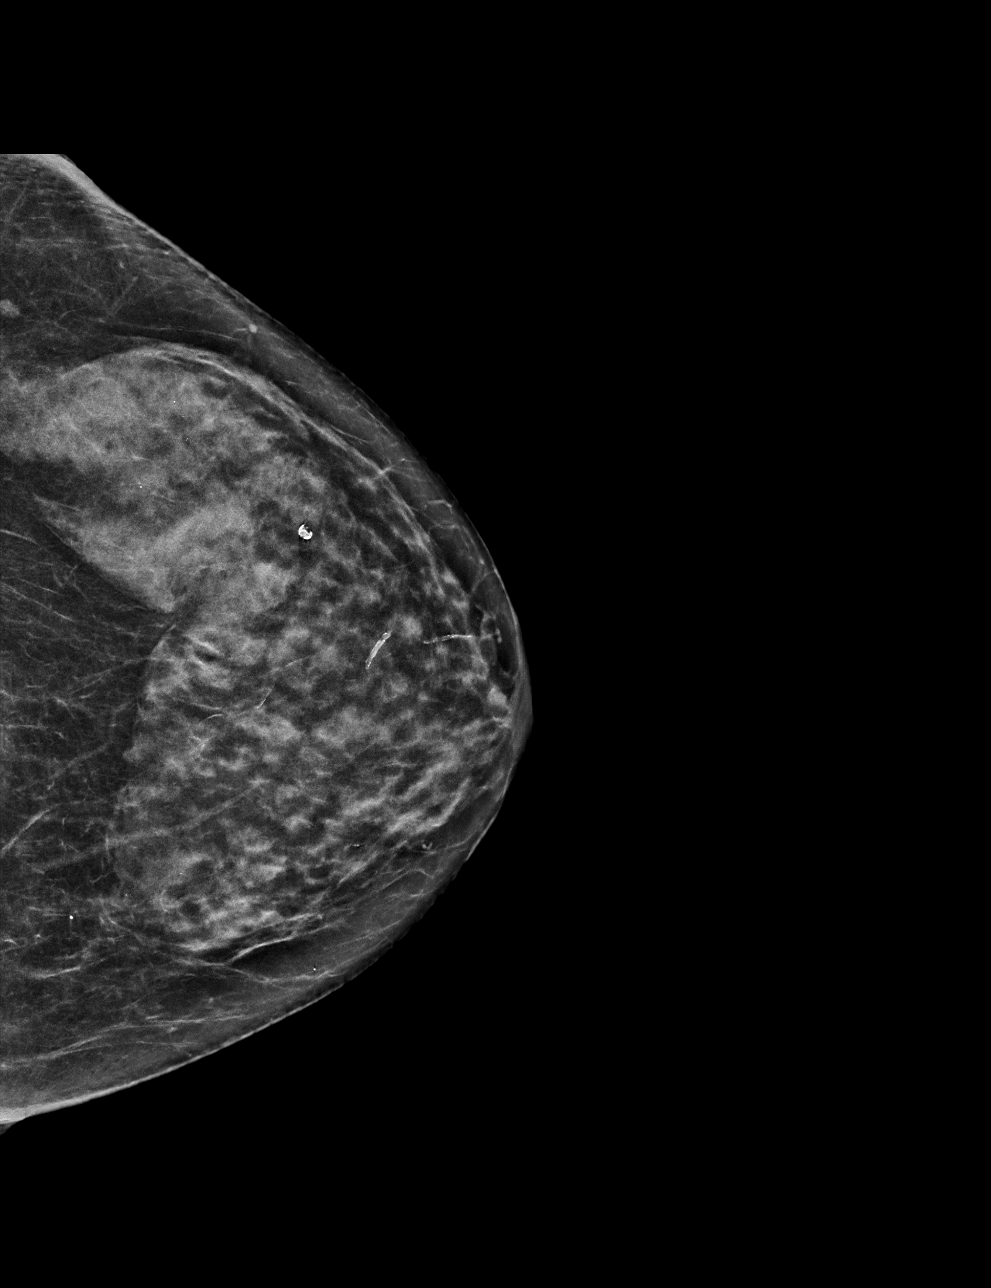

[L MLO tomo · tomo slice 28/55.0]
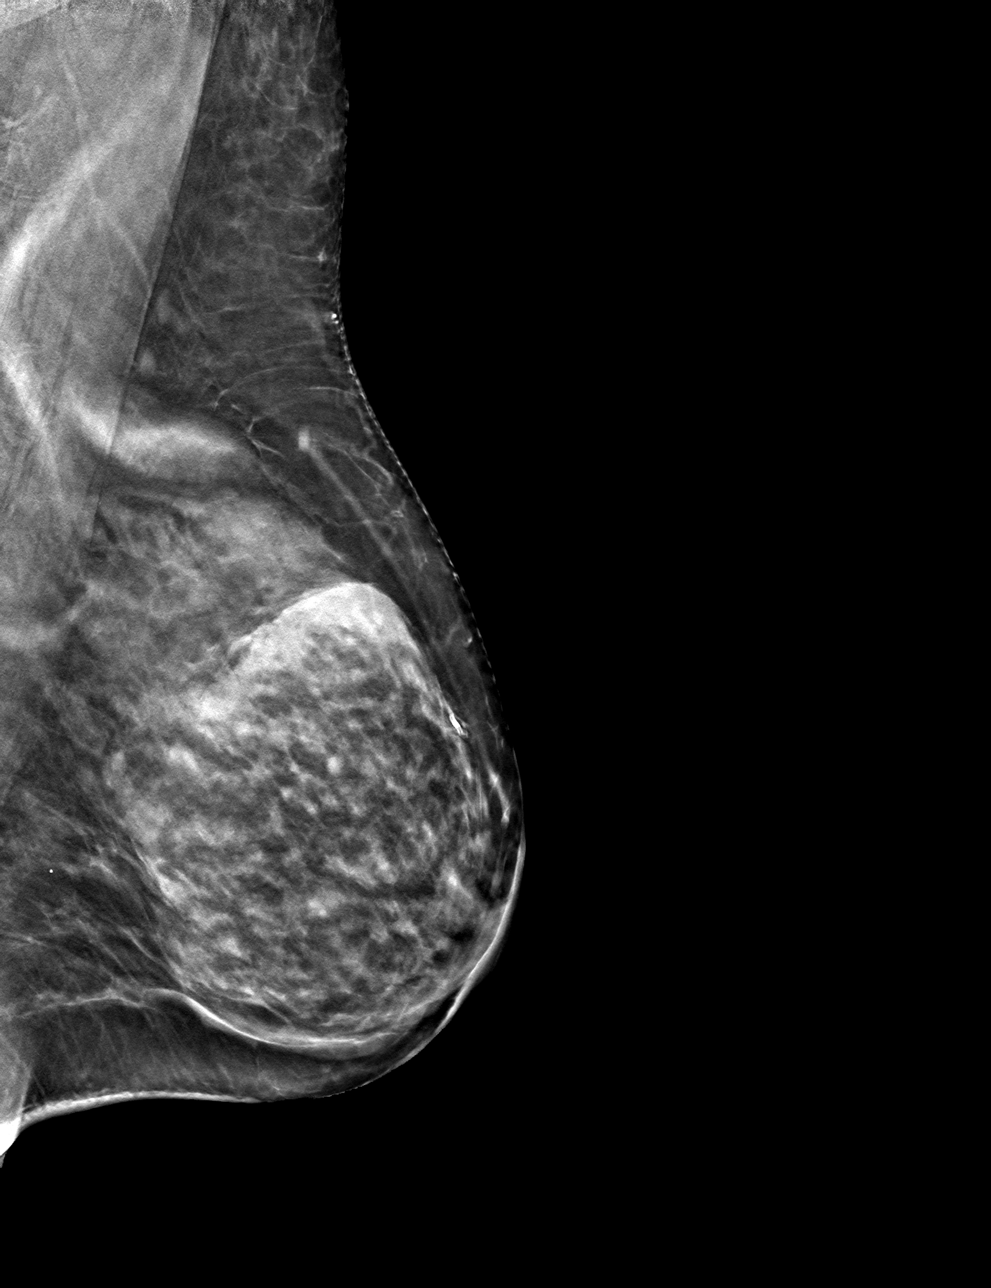

[L CC tomo · tomo slice 27/52.0]
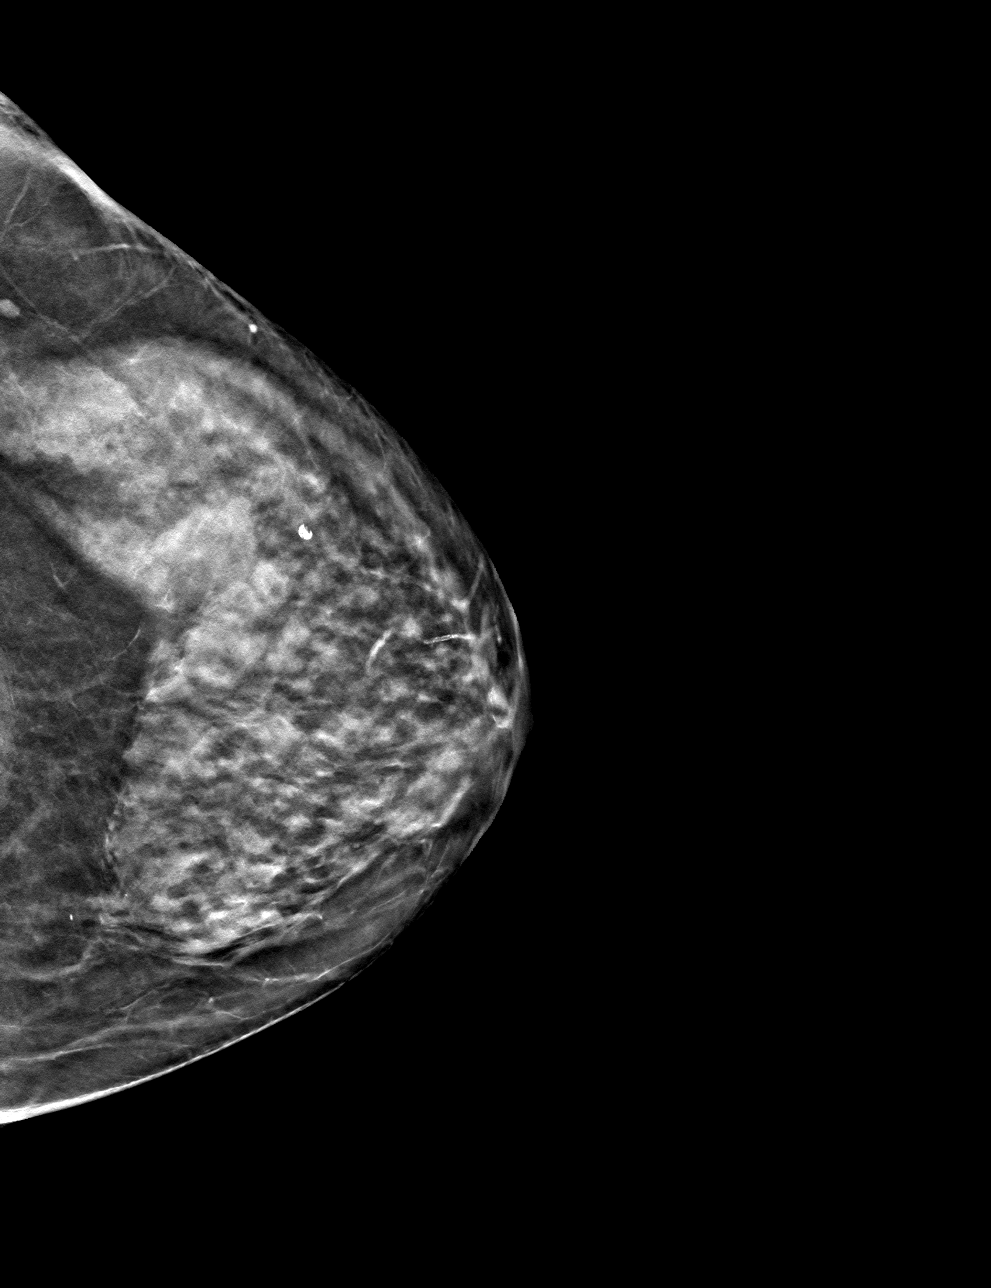

[4 of 12 positions shown; findings below may reference images not displayed]

ACR Breast Density Category c: The breast tissue is heterogeneously
dense, which may obscure small masses.
FINDINGS: No suspicious masses, calcifications, or distortion are identified.

On physical exam, there is a small white bump on the nipple without
associated erythema or warmth. This does not have the appearance of
Paget's disease.

Mammographic images were processed with CAD.
IMPRESSION: No mammographic evidence of malignancy. The bump on the left nipple
is thought to be dermatologic in origin.

RECOMMENDATION:
If the bump does not resolve on its own, recommend consultation with
a dermatologist.

I have discussed the findings and recommendations with the patient.
If applicable, a reminder letter will be sent to the patient
regarding the next appointment.

BI-RADS CATEGORY  2: Benign.

ADDENDUM:
Recommend annual screening mammography in [DATE].

*** End of Addendum ***
ACR Breast Density Category c: The breast tissue is heterogeneously
dense, which may obscure small masses.
FINDINGS: No suspicious masses, calcifications, or distortion are identified.

On physical exam, there is a small white bump on the nipple without
associated erythema or warmth. This does not have the appearance of
Paget's disease.

Mammographic images were processed with CAD.
IMPRESSION: No mammographic evidence of malignancy. The bump on the left nipple
is thought to be dermatologic in origin.

RECOMMENDATION:
If the bump does not resolve on its own, recommend consultation with
a dermatologist.

I have discussed the findings and recommendations with the patient.
If applicable, a reminder letter will be sent to the patient
regarding the next appointment.

BI-RADS CATEGORY  2: Benign.

## 2020-04-30 ENCOUNTER — Telehealth: Payer: Self-pay

## 2020-04-30 MED ORDER — ATORVASTATIN CALCIUM 40 MG PO TABS: 40.0000 mg | ORAL_TABLET | Freq: Every day | ORAL | 0 refills | Status: DC

## 2020-04-30 NOTE — Telephone Encounter (Signed)
Spoke to pt told her Rx was sent to [facility] for 90 day supply. Told pt you are overdue for a physical and labs. Pt verbalized understanding and will call back and schedule appt.

## 2020-04-30 NOTE — Telephone Encounter (Signed)
MEDICATION: atorvastatin 40 MG  PHARMACY: [facility]  Comments:   **Let patient know to contact pharmacy at the end of the day to make sure medication is ready. **  ** Please notify patient to allow 48-72 hours to process**  **Encourage patient to contact the pharmacy for refills or they can request refills through [facility]**

## 2020-06-05 ENCOUNTER — Other Ambulatory Visit: Payer: Self-pay

## 2020-06-05 ENCOUNTER — Other Ambulatory Visit: Payer: Self-pay | Admitting: Physician Assistant

## 2020-06-05 ENCOUNTER — Encounter: Payer: Self-pay | Admitting: Physician Assistant

## 2020-06-05 ENCOUNTER — Ambulatory Visit (INDEPENDENT_AMBULATORY_CARE_PROVIDER_SITE_OTHER): Payer: Medicare Other | Admitting: Physician Assistant

## 2020-06-05 VITALS — BP 128/76 | HR 78 | Temp 97.6°F | Ht 60.0 in | Wt 129.0 lb

## 2020-06-05 DIAGNOSIS — H6981 Other specified disorders of Eustachian tube, right ear: Secondary | ICD-10-CM

## 2020-06-05 DIAGNOSIS — G8929 Other chronic pain: Secondary | ICD-10-CM

## 2020-06-05 DIAGNOSIS — E039 Hypothyroidism, unspecified: Secondary | ICD-10-CM

## 2020-06-05 DIAGNOSIS — Z8719 Personal history of other diseases of the digestive system: Secondary | ICD-10-CM

## 2020-06-05 DIAGNOSIS — M79674 Pain in right toe(s): Secondary | ICD-10-CM

## 2020-06-05 DIAGNOSIS — M1990 Unspecified osteoarthritis, unspecified site: Secondary | ICD-10-CM

## 2020-06-05 DIAGNOSIS — E785 Hyperlipidemia, unspecified: Secondary | ICD-10-CM | POA: Diagnosis not present

## 2020-06-05 DIAGNOSIS — E559 Vitamin D deficiency, unspecified: Secondary | ICD-10-CM | POA: Diagnosis not present

## 2020-06-05 DIAGNOSIS — K5900 Constipation, unspecified: Secondary | ICD-10-CM

## 2020-06-05 DIAGNOSIS — R591 Generalized enlarged lymph nodes: Secondary | ICD-10-CM

## 2020-06-05 DIAGNOSIS — Z0001 Encounter for general adult medical examination with abnormal findings: Secondary | ICD-10-CM

## 2020-06-05 LAB — TSH: TSH: 1.88 u[IU]/mL (ref 0.35–4.50)

## 2020-06-05 LAB — LIPID PANEL
Cholesterol: 217 mg/dL — ABNORMAL HIGH (ref 0–200)
HDL: 61.3 mg/dL (ref >39.00)
LDL Cholesterol: 122 mg/dL — ABNORMAL HIGH (ref 0–99)
NonHDL: 155.64
Total CHOL/HDL Ratio: 4
Triglycerides: 166 mg/dL — ABNORMAL HIGH (ref 0.0–149.0)
VLDL: 33.2 mg/dL (ref 0.0–40.0)

## 2020-06-05 LAB — COMPREHENSIVE METABOLIC PANEL
ALT: 10 U/L (ref 0–35)
AST: 13 U/L (ref 0–37)
Albumin: 4.1 g/dL (ref 3.5–5.2)
Alkaline Phosphatase: 72 U/L (ref 39–117)
BUN: 10 mg/dL (ref 6–23)
CO2: 30 mEq/L (ref 19–32)
Calcium: 9.2 mg/dL (ref 8.4–10.5)
Chloride: 103 mEq/L (ref 96–112)
Creatinine, Ser: 0.78 mg/dL (ref 0.40–1.20)
GFR: 74.01 mL/min (ref >60.00)
Glucose, Bld: 86 mg/dL (ref 70–99)
Potassium: 4.2 mEq/L (ref 3.5–5.1)
Sodium: 140 mEq/L (ref 135–145)
Total Bilirubin: 0.8 mg/dL (ref 0.2–1.2)
Total Protein: 6.7 g/dL (ref 6.0–8.3)

## 2020-06-05 LAB — CBC WITH DIFFERENTIAL/PLATELET
Basophils Absolute: 0.1 10*3/uL (ref 0.0–0.1)
Basophils Relative: 0.8 % (ref 0.0–3.0)
Eosinophils Absolute: 0.1 10*3/uL (ref 0.0–0.7)
Eosinophils Relative: 1 % (ref 0.0–5.0)
HCT: 38.8 % (ref 36.0–46.0)
Hemoglobin: 13.3 g/dL (ref 12.0–15.0)
Lymphocytes Relative: 34.7 % (ref 12.0–46.0)
Lymphs Abs: 2.3 10*3/uL (ref 0.7–4.0)
MCHC: 34.2 g/dL (ref 30.0–36.0)
MCV: 91.8 fl (ref 78.0–100.0)
Monocytes Absolute: 0.4 10*3/uL (ref 0.1–1.0)
Monocytes Relative: 6.2 % (ref 3.0–12.0)
Neutro Abs: 3.8 10*3/uL (ref 1.4–7.7)
Neutrophils Relative %: 57.3 % (ref 43.0–77.0)
Platelets: 254 10*3/uL (ref 150.0–400.0)
RBC: 4.23 Mil/uL (ref 3.87–5.11)
RDW: 13.4 % (ref 11.5–15.5)
WBC: 6.6 10*3/uL (ref 4.0–10.5)

## 2020-06-05 LAB — VITAMIN D 25 HYDROXY (VIT D DEFICIENCY, FRACTURES): VITD: 79.24 ng/mL (ref 30.00–100.00)

## 2020-06-05 MED ORDER — LEVOTHYROXINE SODIUM 75 MCG PO TABS: 75.0000 ug | ORAL_TABLET | ORAL | 1 refills | Status: DC

## 2020-06-05 MED ORDER — ATORVASTATIN CALCIUM 40 MG PO TABS: 40.0000 mg | ORAL_TABLET | Freq: Every day | ORAL | 3 refills | Status: DC

## 2020-06-05 MED ORDER — LEVOTHYROXINE SODIUM 88 MCG PO TABS: 88.0000 ug | ORAL_TABLET | ORAL | 1 refills | Status: DC

## 2020-06-05 MED ORDER — MELOXICAM 7.5 MG PO TABS: 7.5000 mg | ORAL_TABLET | Freq: Every day | ORAL | 3 refills | Status: DC

## 2020-06-05 NOTE — Patient Instructions (Addendum)
It was great to see you!  Start 7.5 mg daily meloxicam (THIS WILL REPLACE IBUPROFEN)  Start miralax as needed for constipation. Use 1 capful daily in beverage of choice to help with your constipation. You do not have to take this daily but please take until you are having regular, formed bowel movements.  No need for garlic supplement.  Call us when you are back in town if you want to pursue ultrasound of your neck.   Please go to the lab for blood work.   Our office will call you with your results unless you have chosen to receive results via MyChart.  If your blood work is normal we will follow-up each year for physicals and as scheduled for chronic medical problems.  If anything is abnormal we will treat accordingly and get you in for a follow-up.  Take care,  [facility] Maintenance, Female Adopting a healthy lifestyle and getting preventive care are important in promoting health and wellness. Ask your health care provider about:  The right schedule for you to have regular tests and exams.  Things you can do on your own to prevent diseases and keep yourself healthy. What should I know about diet, weight, and exercise? Eat a healthy diet  Eat a diet that includes plenty of vegetables, fruits, low-fat dairy products, and lean protein.  Do not eat a lot of foods that are high in solid fats, added sugars, or sodium.   Maintain a healthy weight Body mass index (BMI) is used to identify weight problems. It estimates body fat based on height and weight. Your health care provider can help determine your BMI and help you achieve or maintain a healthy weight. Get regular exercise Get regular exercise. This is one of the most important things you can do for your health. Most adults should:  Exercise for at least 150 minutes each week. The exercise should increase your heart rate and make you sweat (moderate-intensity exercise).  Do strengthening exercises at least twice a  week. This is in addition to the moderate-intensity exercise.  Spend less time sitting. Even light physical activity can be beneficial. Watch cholesterol and blood lipids Have your blood tested for lipids and cholesterol at 76 years of age, then have this test every 5 years. Have your cholesterol levels checked more often if:  Your lipid or cholesterol levels are high.  You are older than 76 years of age.  You are at high risk for heart disease. What should I know about cancer screening? Depending on your health history and family history, you may need to have cancer screening at various ages. This may include screening for:  Breast cancer.  Cervical cancer.  Colorectal cancer.  Skin cancer.  Lung cancer. What should I know about heart disease, diabetes, and high blood pressure? Blood pressure and heart disease  High blood pressure causes heart disease and increases the risk of stroke. This is more likely to develop in people who have high blood pressure readings, are of African descent, or are overweight.  Have your blood pressure checked: ? Every 3-5 years if you are 34-18 years of age. ? Every year if you are 48 years old or older. Diabetes Have regular diabetes screenings. This checks your fasting blood sugar level. Have the screening done:  Once every three years after age 47 if you are at a normal weight and have a low risk for diabetes.  More often and at a younger age if you are  overweight or have a high risk for diabetes. What should I know about preventing infection? Hepatitis B If you have a higher risk for hepatitis B, you should be screened for this virus. Talk with your health care provider to find out if you are at risk for hepatitis B infection. Hepatitis C Testing is recommended for:  Everyone born from [birth year] through 1966.  Anyone with known risk factors for hepatitis C. Sexually transmitted infections (STIs)  Get screened for STIs, including gonorrhea  and chlamydia, if: ? You are sexually active and are younger than 76 years of age. ? You are older than 76 years of age and your health care provider tells you that you are at risk for this type of infection. ? Your sexual activity has changed since you were last screened, and you are at increased risk for chlamydia or gonorrhea. Ask your health care provider if you are at risk.  Ask your health care provider about whether you are at high risk for HIV. Your health care provider may recommend a prescription medicine to help prevent HIV infection. If you choose to take medicine to prevent HIV, you should first get tested for HIV. You should then be tested every 3 months for as long as you are taking the medicine. Pregnancy  If you are about to stop having your period (premenopausal) and you may become pregnant, seek counseling before you get pregnant.  Take 400 to 800 micrograms (mcg) of folic acid every day if you become pregnant.  Ask for birth control (contraception) if you want to prevent pregnancy. Osteoporosis and menopause Osteoporosis is a disease in which the bones lose minerals and strength with aging. This can result in bone fractures. If you are 19 years old or older, or if you are at risk for osteoporosis and fractures, ask your health care provider if you should:  Be screened for bone loss.  Take a calcium or vitamin D supplement to lower your risk of fractures.  Be given hormone replacement therapy (HRT) to treat symptoms of menopause. Follow these instructions at home: Lifestyle  Do not use any products that contain nicotine or tobacco, such as cigarettes, e-cigarettes, and chewing tobacco. If you need help quitting, ask your health care provider.  Do not use street drugs.  Do not share needles.  Ask your health care provider for help if you need support or information about quitting drugs. Alcohol use  Do not drink alcohol if: ? Your health care provider tells you not  to drink. ? You are pregnant, may be pregnant, or are planning to become pregnant.  If you drink alcohol: ? Limit how much you use to 0-1 drink a day. ? Limit intake if you are breastfeeding.  Be aware of how much alcohol is in your drink. In the [place] one drink equals one 12 oz bottle of beer (355 mL), one 5 oz glass of wine (148 mL), or one 1 oz glass of hard liquor (44 mL). General instructions  Schedule regular health, dental, and eye exams.  Stay current with your vaccines.  Tell your health care provider if: ? You often feel depressed. ? You have ever been abused or do not feel safe at home. Summary  Adopting a healthy lifestyle and getting preventive care are important in promoting health and wellness.  Follow your health care provider's instructions about healthy diet, exercising, and getting tested or screened for diseases.  Follow your health care provider's instructions on monitoring your cholesterol and  blood pressure. This information is not intended to replace advice given to you by your health care provider. Make sure you discuss any questions you have with your health care provider. Document Revised: 03/10/2018 Document Reviewed: 03/10/2018 Elsevier Patient Education  2021 [employer].

## 2020-06-05 NOTE — Progress Notes (Signed)
I acted as a Education administrator for [employer], PA-C Anselmo Pickler, LPN    Subjective:    Heather Hodges is a 75 y.o. female and is here for a comprehensive physical exam.   HPI  Health Maintenance Due  Topic Date Due  . MAMMOGRAM  02/18/2020    Acute Concerns: R gland on neck -- has noticed fullness and swelling to the R side of her neck. It has been swollen x 1 month. She has not taken anything for it. At times she has difficulty swallowing due to this.  R ear feels fullness -- feels like she has pressure. Denies hearing changes. Has not trialed nasal sprays consistently. Blisters on back the throat -- peppers, salt and other seasonings cause blisters in the roof of her mouth. Has blisters now. Feels like the roof of her mouth is on fire and has had sandpaper feel to it. Prevents her from eating at times. R toe pain -- while she was helping her children move and she had an umbrella fall on her foot on 02/18/20. She went to the ER because she couldn't bear weight. Was told that she did not have a fracture but has arthritis in her foot. Continues to have ongoing pain. Wears supportive shoes. Constipation -- intermittent. Has has some hard to pass stool balls. Does not regularly take anything for this. Has not had rectal bleeding.  Chronic Issues: Arthritis -- would like to take meloxicam for her pain. She feels like she should start regular exercise program. Has had PT in the past but has stopped doing her exercises. Denies new or significantly worsening pain. Hypothyroidism -- alternates 75 mcg and 88 mcg daily. Denies any concerns for lack of control, temperature intolerance. Compliant with medication. Vitamin D deficiency -- would like this updated today. Takes vit D regularly. HLD -- currently on lipitor 40 mg daily. Tolerates well without side effects.  Health Maintenance: Immunizations -- UTD Colonoscopy -- UTD, due 10/2025 Mammogram -- overdue PAP -- N/A Bone Density -- UTD.  Done 12/29/2019 Diet -- well balanced Exercise -- limited due to arthritis Weight -- Weight: 129 lb (58.5 kg)  Mood -- no concerns Weight history: Wt Readings from Last 10 Encounters:  06/05/20 129 lb (58.5 kg)  12/23/19 129 lb 8 oz (58.7 kg)  09/14/19 128 lb 8 oz (58.3 kg)  07/13/19 127 lb 3.2 oz (57.7 kg)   Body mass index is 25.19 kg/m. No LMP recorded. Patient is postmenopausal. Period characteristics: no vaginal bleeding Alcohol use: none Tobacco use: none  Depression screen PHQ 2/9 06/05/2020  Decreased Interest 0  Down, Depressed, Hopeless 0  PHQ - 2 Score 0     Other providers/specialists: Patient Care Team: Inda Coke, [place] as PCP - General (Physician Assistant) Celene Squibb., MD as Referring Physician (Neurology)    PMHx, SurgHx, SocialHx, Medications, and Allergies were reviewed in the Visit Navigator and updated as appropriate.   Past Medical History:  Diagnosis Date  . Arthritis   . Colon polyps   . Diverticulosis   . History of chicken pox   . Hyperlipidemia   . Hypothyroidism   . Osteoporosis   . Vitamin D deficiency      Past Surgical History:  Procedure Laterality Date  . TONSILLECTOMY AND ADENOIDECTOMY       Family History  Problem Relation Age of Onset  . Arthritis Mother   . Breast cancer Mother   . High Cholesterol Mother   . Heart attack Father   .  High Cholesterol Father   . Arthritis Brother   . Hearing loss Brother   . Hypercholesterolemia Brother     Social History   Tobacco Use  . Smoking status: Never Smoker  . Smokeless tobacco: Never Used  Substance Use Topics  . Alcohol use: Never  . Drug use: Never    Review of Systems:   Review of Systems  Constitutional: Negative for chills, fever, malaise/fatigue and weight loss.  HENT: Negative for hearing loss, sinus pain and sore throat.   Eyes: Negative for blurred vision.  Respiratory: Negative for cough and shortness of breath.   Cardiovascular: Negative for  chest pain, palpitations and leg swelling.  Gastrointestinal: Negative for abdominal pain, constipation, diarrhea, heartburn, nausea and vomiting.  Genitourinary: Negative for dysuria, frequency and urgency.  Musculoskeletal: Negative for back pain, myalgias and neck pain.  Skin: Negative for itching and rash.  Neurological: Negative for dizziness, tingling, seizures, loss of consciousness and headaches.  Endo/Heme/Allergies: Negative for polydipsia.  Psychiatric/Behavioral: Negative for depression. The patient is not nervous/anxious.     Objective:   BP 128/76 (BP Location: Left Arm, Patient Position: Sitting, Cuff Size: Normal)   Pulse 78   Temp 97.6 F (36.4 C) (Temporal)   Ht 5' (1.524 m)   Wt 129 lb (58.5 kg)   SpO2 94%   BMI 25.19 kg/m  Body mass index is 25.19 kg/m.   General Appearance:    Alert, cooperative, no distress, appears stated age  Head:    Normocephalic, without obvious abnormality, atraumatic  Eyes:    PERRL, conjunctiva/corneas clear, EOM's intact, fundi    benign, both eyes  Ears:    Normal TM's and external ear canals, both ears  Nose:   Nares normal, septum midline, mucosa normal, no drainage    or sinus tenderness  Throat:   Lips, mucosa, and tongue normal; teeth and gums normal  Neck:   Supple,  trachea midline,   thyroid:  no tenderness/nodules; no carotid   bruit or JVD Visible mild fullness/swelling to R anterior neck  Back:     Symmetric, no curvature, ROM normal, no CVA tenderness  Lungs:     Clear to auscultation bilaterally, respirations unlabored  Chest Wall:    No tenderness or deformity   Heart:    Regular rate and rhythm, S1 and S2 normal, no murmur, rub or gallop  Breast Exam:    Deferred  Abdomen:     Soft, non-tender, bowel sounds active all four quadrants,    no masses, no organomegaly  Genitalia:    Deferred  Extremities:   Extremities normal, atraumatic, no cyanosis or edema TTP to R MCP joint and with ROM Able to bear weight   Pulses:   2+ and symmetric all extremities  Skin:   Skin color, texture, turgor normal, no rashes or lesions  Lymph nodes:   R cervical lymphadenopathy supraclavicular, and axillary nodes normal  Neurologic:   CNII-XII intact, normal strength, sensation and reflexes    throughout      Assessment/Plan:   Oretha was seen today for annual exam.  Diagnoses and all orders for this visit:  Encounter for general adult medical examination with abnormal findings Today patient counseled on age appropriate routine health concerns for screening and prevention, each reviewed and up to date or declined. Immunizations reviewed and up to date or declined. Labs ordered and reviewed. Risk factors for depression reviewed and negative. Hearing function and visual acuity are intact. ADLs screened and addressed as  needed. Functional ability and level of safety reviewed and appropriate. Education, counseling and referrals performed based on assessed risks today. Patient provided with a copy of personalized plan for preventive services.  Arthritis Trial 7.5 mg mobic daily. Stop ibuprofen.  Encouraged water aerobics and PT exercises. Followup if any worsening.  Vitamin D deficiency Update blood work today and provide recommendations as indicated. -     VITAMIN D 25 Hydroxy (Vit-D Deficiency, Fractures)  Acquired hypothyroidism Update TSH today. Will adjust 75 mcg and 88 mcg alternate dosing as indicated based on lab results. -     TSH  Hyperlipidemia, unspecified hyperlipidemia type Update lipid panel and adjust 40 mg daily lipitor prn. -     Lipid panel  Lymphadenopathy Recommend u/s given fullness appearance however she is traveling and declines this. Recommended low threshold to follow-up with [place] if worsening swallowing issues or unintentional weight changes or other concerns. She states that she will call us when returning from her trip to order u/s if she would like this. -     CBC with  Differential/Platelet -     Comprehensive metabolic panel  Constipation, unspecified constipation type No red flags on exam. Recommend trialing miralax one capful prn. Continue regularly if needed. Follow-up if lack of improvement.  Dysfunction of right eustachian tube No red flags on exam. Recommend trialing flonase bilateral twice daily to see if this helps. If no improvement, refer to ENT.  History of oral lesions No visible blisters on my exam. Recommend follow-up with dentist and if needed, we can refer to ENT.  Chronic toe pain, right foot No red flags on exam. Trial mobic 7.5 mg daily. Continue supportive shoes. Referral to sports medicine if any further concerns.  Other orders -     meloxicam (MOBIC) 7.5 MG tablet; Take 1 tablet (7.5 mg total) by mouth daily.   Well Adult Exam: Labs ordered: Yes. Patient counseling was done. See below for items discussed. Discussed the patient's BMI.  The BMI is in the acceptable range Follow up in 3 months. Breast cancer screening: overdue, counseled. Cervical cancer screening: UTD   Patient Counseling: [x]    Nutrition: Stressed importance of moderation in sodium/caffeine intake, saturated fat and cholesterol, caloric balance, sufficient intake of fresh fruits, vegetables, fiber, calcium, iron, and 1 mg of folate supplement per day (for females capable of pregnancy).  [x]    Stressed the importance of regular exercise.   [x]    Substance Abuse: Discussed cessation/primary prevention of tobacco, alcohol, or other drug use; driving or other dangerous activities under the influence; availability of treatment for abuse.   [x]    Injury prevention: Discussed safety belts, safety helmets, smoke detector, smoking near bedding or upholstery.   [x]    Sexuality: Discussed sexually transmitted diseases, partner selection, use of condoms, avoidance of unintended pregnancy  and contraceptive alternatives.  [x]    Dental health: Discussed importance of  regular tooth brushing, flossing, and dental visits.  [x]    Health maintenance and immunizations reviewed. Please refer to Health maintenance section.   CMA or LPN served as scribe during this visit. History, Physical, and Plan performed by medical provider. The above documentation has been reviewed and is accurate and complete.  Time spent with patient today was 50 minutes which consisted of chart review, discussing diagnosis, work up, treatment answering questions and documentation.  Inda Coke, PA-C [place]

## 2020-11-02 NOTE — Progress Notes (Signed)
Di Bagot is a 75 y.o. female here for a new problem.  I acted as a Education administrator for [employer], PA-C [employer], LPN   History of Present Illness:   Chief Complaint  Patient presents with   Rash   Arthritis    HPI  Rash Pt c/o rash on forehead and face not sure how long it has been there. Pt noticed maybe 3 months ago. Rash is light red blotchy, Denies itching and no painful. Denies new skin products. Is planning to see her usual dermatologist in October. Doesn't spend a lot of time outside in the direct sun. Feels like her hair is overall thinning. Denies new hair products.   Hypothyroidism Currently taking levothyroxine 88 mcg every other day and 75 mcg every other day. Tolerating well. Denies temperature intolerance or weight changes.    Arthritis Pt c/o increase of joint pain x 6 months. Uses lidocatine patches prn on her back. Pain is in ankles, knees and throughout her back. Did a lot of overhead movement and bending over the summer and feels like she is starting to feel repercussions from that. She has done PT in the past after her fall. Taking meloxicam 7.5 mg and tart cherry supplement.   Past Medical History:  Diagnosis Date   Arthritis    Colon polyps    Diverticulosis    History of chicken pox    Hyperlipidemia    Hypothyroidism    Osteoporosis    Vitamin D deficiency      Social History   Tobacco Use   Smoking status: Never   Smokeless tobacco: Never  Substance Use Topics   Alcohol use: Never   Drug use: Never    Past Surgical History:  Procedure Laterality Date   TONSILLECTOMY AND ADENOIDECTOMY      Family History  Problem Relation Age of Onset   Arthritis Mother    Breast cancer Mother    High Cholesterol Mother    Heart attack Father    High Cholesterol Father    Arthritis Brother    Hearing loss Brother    Hypercholesterolemia Brother     Allergies  Allergen Reactions   Risedronate Sodium Other (See Comments)    TMJ  dysfunction    Current Medications:   Current Outpatient Medications:    atorvastatin (LIPITOR) 40 MG tablet, Take 1 tablet (40 mg total) by mouth daily., Disp: 90 tablet, Rfl: 3   b complex vitamins capsule, Take 1 capsule by mouth every other day., Disp: , Rfl:    Calcium Carbonate-Vitamin D (CALCIUM 500+D PO), Take 2 each by mouth every other day., Disp: , Rfl:    Garlic 123XX123 MG CAPS, Take by mouth., Disp: , Rfl:    ketoconazole (NIZORAL) 2 % cream, Apply to affected area 1-2 times daily, Disp: 15 g, Rfl: 0   levothyroxine (SYNTHROID) 75 MCG tablet, Take 1 tablet (75 mcg total) by mouth every other day., Disp: 90 tablet, Rfl: 1   levothyroxine (SYNTHROID) 88 MCG tablet, Take 1 tablet (88 mcg total) by mouth every other day., Disp: 90 tablet, Rfl: 1   lidocaine (XYLOCAINE) 4 % external solution, Apply topically., Disp: , Rfl:    Lidocaine 4 % PTCH, Place onto the skin., Disp: , Rfl:    meloxicam (MOBIC) 7.5 MG tablet, Take 1 tablet (7.5 mg total) by mouth daily., Disp: 90 tablet, Rfl: 3   Tart Cherry 1200 MG CAPS, Take 1 capsule by mouth daily in the afternoon., Disp: , Rfl:  traMADol (ULTRAM) 50 MG tablet, Take by mouth., Disp: , Rfl:    gabapentin (NEURONTIN) 100 MG capsule, Take 100 mg by mouth 2 (two) times daily., Disp: , Rfl:    Review of Systems:   ROS Negative unless otherwise specified per HPI.   Vitals:   Vitals:   11/05/20 0852  BP: 110/70  Pulse: 68  Temp: 97.7 F (36.5 C)  TempSrc: Temporal  SpO2: 95%  Weight: 127 lb 8 oz (57.8 kg)  Height: 5' (1.524 m)     Body mass index is 24.9 kg/m.  Physical Exam:   Physical Exam Vitals and nursing note reviewed.  Constitutional:      General: She is not in acute distress.    Appearance: She is well-developed. She is not ill-appearing or toxic-appearing.  Cardiovascular:     Rate and Rhythm: Normal rate and regular rhythm.     Pulses: Normal pulses.     Heart sounds: Normal heart sounds, S1 normal and S2  normal.     Comments: No LE edema Pulmonary:     Effort: Pulmonary effort is normal.     Breath sounds: Normal breath sounds.  Skin:    General: Skin is warm and dry.     Comments: No rashes noted  Neurological:     Mental Status: She is alert.     GCS: GCS eye subscore is 4. GCS verbal subscore is 5. GCS motor subscore is 6.  Psychiatric:        Speech: Speech normal.        Behavior: Behavior normal. Behavior is cooperative.    Assessment and Plan:   Shatara was seen today for rash and arthritis.  Diagnoses and all orders for this visit:  Hair thinning Update TSH and iron panel today. She is already planning to see dermatology in two months -- will defer further work-up and treatment to them. No red flags. -     Iron, TIBC and Ferritin Panel  Rash No rash present on my exam. Trial topical ketoconazole due to recent increase in outdoors and hat use. May use prn. Follow-up with derm prn.  Acquired hypothyroidism Update TSH and will adjust current levothyroxine script as indicated. -     TSH  Arthritis Resume home PT exercises that she has written down at home. Continue current meloxicam script and prn lidocaine patches. Referral to sports medicine. Deferred any gabapentin and tramadol refill to current prescriber (Dr Ermalene Postin) -     Ambulatory referral to Sports Medicine  Other orders -     ketoconazole (NIZORAL) 2 % cream; Apply to affected area 1-2 times daily  CMA or LPN served as scribe during this visit. History, Physical, and Plan performed by medical provider. The above documentation has been reviewed and is accurate and complete.  Time spent with patient today was 30 minutes which consisted of chart review, discussing diagnosis, work up, treatment answering questions and documentation.   Inda Coke, PA-C

## 2020-11-05 ENCOUNTER — Encounter: Payer: Self-pay | Admitting: Physician Assistant

## 2020-11-05 ENCOUNTER — Ambulatory Visit (INDEPENDENT_AMBULATORY_CARE_PROVIDER_SITE_OTHER): Payer: Medicare Other | Admitting: Physician Assistant

## 2020-11-05 ENCOUNTER — Other Ambulatory Visit: Payer: Self-pay

## 2020-11-05 VITALS — BP 110/70 | HR 68 | Temp 97.7°F | Ht 60.0 in | Wt 127.5 lb

## 2020-11-05 DIAGNOSIS — M1990 Unspecified osteoarthritis, unspecified site: Secondary | ICD-10-CM | POA: Diagnosis not present

## 2020-11-05 DIAGNOSIS — E039 Hypothyroidism, unspecified: Secondary | ICD-10-CM | POA: Diagnosis not present

## 2020-11-05 DIAGNOSIS — R21 Rash and other nonspecific skin eruption: Secondary | ICD-10-CM | POA: Diagnosis not present

## 2020-11-05 DIAGNOSIS — L659 Nonscarring hair loss, unspecified: Secondary | ICD-10-CM

## 2020-11-05 LAB — IRON,TIBC AND FERRITIN PANEL
%SAT: 30 % (calc) (ref 16–45)
Ferritin: 49 ng/mL (ref 16–288)
Iron: 93 ug/dL (ref 45–160)
TIBC: 314 mcg/dL (calc) (ref 250–450)

## 2020-11-05 LAB — TSH: TSH: 1.16 u[IU]/mL (ref 0.35–5.50)

## 2020-11-05 MED ORDER — KETOCONAZOLE 2 % EX CREA: TOPICAL_CREAM | CUTANEOUS | 0 refills | Status: AC

## 2020-11-05 NOTE — Patient Instructions (Addendum)
It was great to see you!  Start back on your exercises from your physical therapist  Trial cream that I have sent for your forehead  Call Dr. Ermalene Postin to get refills on your pain medications.  A referral has been placed for you to see Dr. [name] with [facility]. Someone from there office will be in touch soon regarding your appointment with him. His location: [place] at [facility] [address] on the 1st floor.   Phone number [phone], Fax [phone].  This location is across the street from the entrance to [employer] and in the same complex as the [employer] [facility]  Let's follow-up in 3 months, sooner if you have concerns.  If a referral was placed today, you will be contacted for an appointment. Please note that routine referrals can sometimes take up to 3-4 weeks to process. Please call our office if you haven't heard anything after this time frame.  Take care,  Inda Coke PA-C

## 2020-11-13 NOTE — Progress Notes (Signed)
Subjective:    CC: Joint pain  I, Wendy Poet, LAT, ATC, am serving as scribe for Dr. [name].  HPI: Pt is a 75 y/o female presenting w/ increasing joint pain x 6 months particularly in her ankles, knees and low back. Pt notes a hx of a fx in her "back" (T3 & T6) and ribs after a fall from a ladder in 2019. Pt also reports a hx of arthritis in multiple joints throughout her body. Pt has been seen by neurology and has had steroid injections in the past. Pt reports that now she is having pain in her neck w/ increased pain w/ cervical rotation. Pt has been to PT in the past and has HEP that she has been working on. Pt states that she is starting to have pain in her hands/fingers as well.   Joint swelling: yes- R knee & ankle Aggravating factors: weather change, over exerting, overhead, traveling Treatments tried: PT; meloxicam; tart cherry supplement, calcium, gabapentin, tramadol, heating pad  Patient has had trials of what sounds like thoracic facet medial branch block which did not help very much at the T7 level.  Pertinent review of Systems: No fevers or chills  Relevant historical information: History of thoracic compression fracture after a fall from a ladder.  Additionally history of osteoporosis.   Objective:    Vitals:   11/14/20 0851  BP: 130/86  Pulse: 80  SpO2: 95%   General: Well Developed, well nourished, and in no acute distress.   MSK: T-spine nontender midline.  Mildly tender palpation paraspinal musculature.  Relatively preserved thoracic motion. L-spine nontender midline.  Decreased lumbar motion. Right hip knee and ankle exam somewhat deferred today.  Mild antalgic gait.  Lab and Radiology Results  X-ray images T-spine, L-spine, right hip, right knee and right ankle obtained today personally and independently interpreted.  T-spine: Old compression fracture visible at T6.  No new acute compression fractures visible  L-spine: Lumbar spondylosis  present.  No acute fractures are visible.  Facet DJD L5-S1.  Right hip: Mild hip DJD.  Enthesopathic changes greater trochanter and lesser trochanter.  No acute fractures are visible.  Right knee: Mild patellofemoral DJD.  No acute fractures are visible.  Right ankle: Mild ankle DJD.  No acute fractures.  Await formal radiology review   EXAM:  THORACIC SPINE - 3 VIEWS   COMPARISON:  Thoracic MRI dated 03/23 2020   FINDINGS:  There are old mild compression deformities of T3 and T6, unchanged  since the prior MRI. Thoracic spine otherwise appears.   IMPRESSION:  No acute abnormality. Old compression deformities of T3 and T6.    Electronically Signed    By: Lorriane Shire M.D.    On: 06/28/2019 10:13  Impression and Recommendations:    Assessment and Plan: 75 y.o. female with multifactorial pain  Thoracic back pain: Chronic longstanding after a fall from the ladder a few years ago resulting in what appears to be now compression fractures in her thoracic spine.  Unfortunately these are chronic and are too old to be treated with kyphoplasty or vertebroplasty.  She has had some trials of what sounds like medial branch block and ablation with her neurologist and I think at the T7 level which did not help.  I think it is reasonable to have a trial again of physical therapy to work on basic muscle stabilization and potentially some modalities such as dry needling to overall improve her general pain.  Obtain x-rays today  and recheck in 3 to 4 weeks.  Chronic low back pain: Similar issue.  Plan for physical therapy and repeat x-ray.  Recheck 3 to 4 weeks.  Certainly could proceed with advanced imaging in the future.  Right hip knee and ankle pain: These issues were somewhat deferred today.  X-rays obtained we will focus more on these pain generators at the next visit.  PDMP not reviewed this encounter. Orders Placed This Encounter  Procedures   DG Thoracic Spine 2 View    Standing  Status:   Future    Number of Occurrences:   1    Standing Expiration Date:   11/14/2021    Order Specific Question:   Reason for Exam (SYMPTOM  OR DIAGNOSIS REQUIRED)    Answer:   eval pain tpsin    Order Specific Question:   Preferred imaging location?    Answer:   [facility]   DG Lumbar Spine 2-3 Views    Standing Status:   Future    Number of Occurrences:   1    Standing Expiration Date:   11/14/2021    Order Specific Question:   Reason for Exam (SYMPTOM  OR DIAGNOSIS REQUIRED)    Answer:   eval lumbar spine    Order Specific Question:   Preferred imaging location?    Answer:   [facility]   DG HIP UNILAT WITH PELVIS 2-3 VIEWS RIGHT    Standing Status:   Future    Number of Occurrences:   1    Standing Expiration Date:   11/14/2021    Order Specific Question:   Reason for Exam (SYMPTOM  OR DIAGNOSIS REQUIRED)    Answer:   eval hip pain    Order Specific Question:   Preferred imaging location?    Answer:   [facility]   DG Knee AP/LAT W/Sunrise Right    Standing Status:   Future    Number of Occurrences:   1    Standing Expiration Date:   11/14/2021    Order Specific Question:   Reason for Exam (SYMPTOM  OR DIAGNOSIS REQUIRED)    Answer:   eval pain    Order Specific Question:   Preferred imaging location?    Answer:   [facility]   DG Ankle Complete Right    Standing Status:   Future    Number of Occurrences:   1    Standing Expiration Date:   11/14/2021    Order Specific Question:   Reason for Exam (SYMPTOM  OR DIAGNOSIS REQUIRED)    Answer:   eval ankle pain    Order Specific Question:   Preferred imaging location?    Answer:   [facility]   Ambulatory referral to Physical Therapy    Referral Priority:   Routine    Referral Type:   Physical Medicine    Referral Reason:   Specialty Services Required    Requested Specialty:   Physical Therapy    Number of Visits Requested:   1   No orders of the defined types were placed in  this encounter.   Discussed warning signs or symptoms. Please see discharge instructions. Patient expresses understanding.   The above documentation has been reviewed and is accurate and complete [name], M.D.

## 2020-11-14 ENCOUNTER — Other Ambulatory Visit: Payer: Self-pay

## 2020-11-14 ENCOUNTER — Ambulatory Visit (INDEPENDENT_AMBULATORY_CARE_PROVIDER_SITE_OTHER): Payer: Medicare Other

## 2020-11-14 ENCOUNTER — Ambulatory Visit (INDEPENDENT_AMBULATORY_CARE_PROVIDER_SITE_OTHER): Payer: Medicare Other | Admitting: Family Medicine

## 2020-11-14 VITALS — BP 130/86 | HR 80 | Ht 60.0 in | Wt 130.2 lb

## 2020-11-14 DIAGNOSIS — S22000A Wedge compression fracture of unspecified thoracic vertebra, initial encounter for closed fracture: Secondary | ICD-10-CM

## 2020-11-14 DIAGNOSIS — M546 Pain in thoracic spine: Secondary | ICD-10-CM | POA: Diagnosis not present

## 2020-11-14 DIAGNOSIS — G8929 Other chronic pain: Secondary | ICD-10-CM

## 2020-11-14 DIAGNOSIS — M545 Low back pain, unspecified: Secondary | ICD-10-CM

## 2020-11-14 DIAGNOSIS — M25561 Pain in right knee: Secondary | ICD-10-CM

## 2020-11-14 DIAGNOSIS — M25551 Pain in right hip: Secondary | ICD-10-CM

## 2020-11-14 DIAGNOSIS — M25571 Pain in right ankle and joints of right foot: Secondary | ICD-10-CM

## 2020-11-14 IMAGING — DX DG ANKLE COMPLETE 3+V*R*
3 series · 3 of 3 positions shown · non-contrast
Comparison: None.

CLINICAL DATA: Ankle pain

EXAM:
RIGHT ANKLE - COMPLETE 3+ VIEW

[ankle ap]
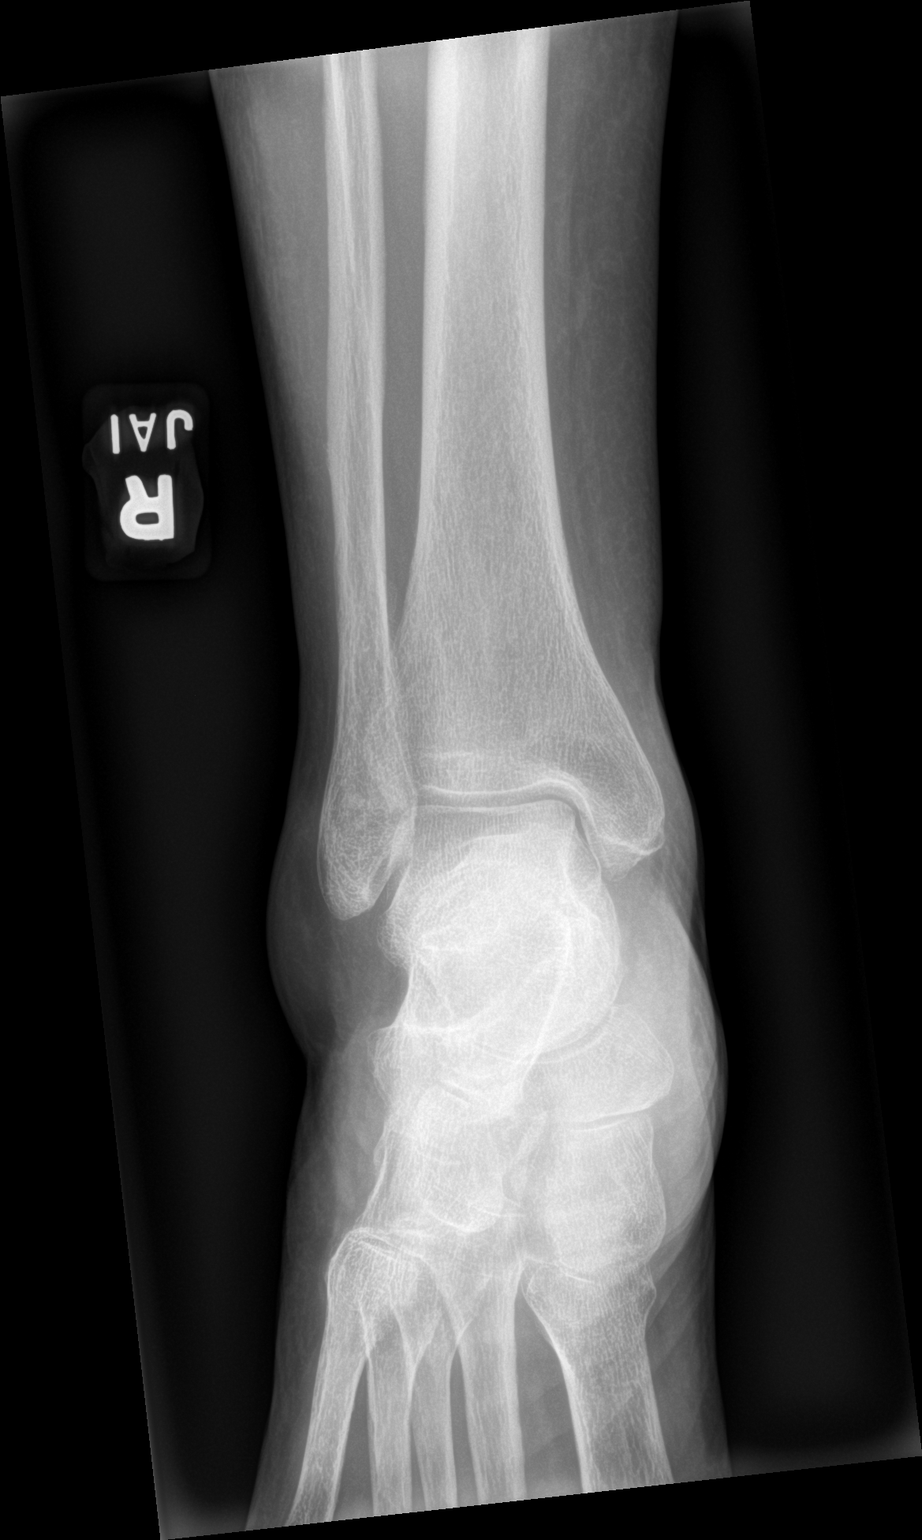

[ankle obl]
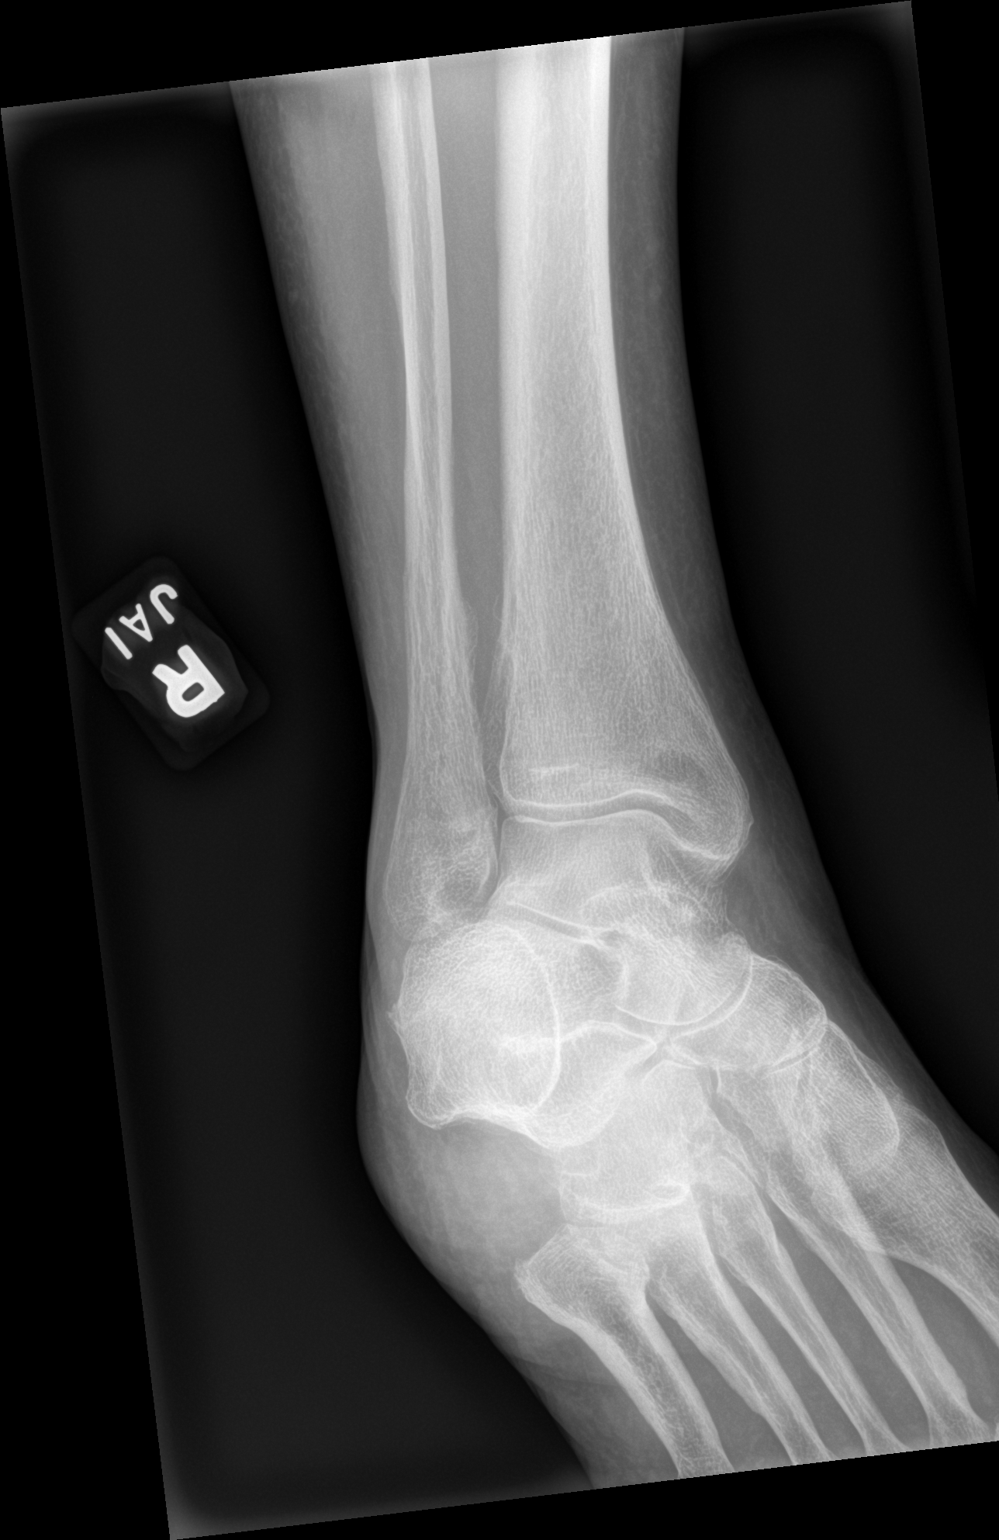

[ankle lat]
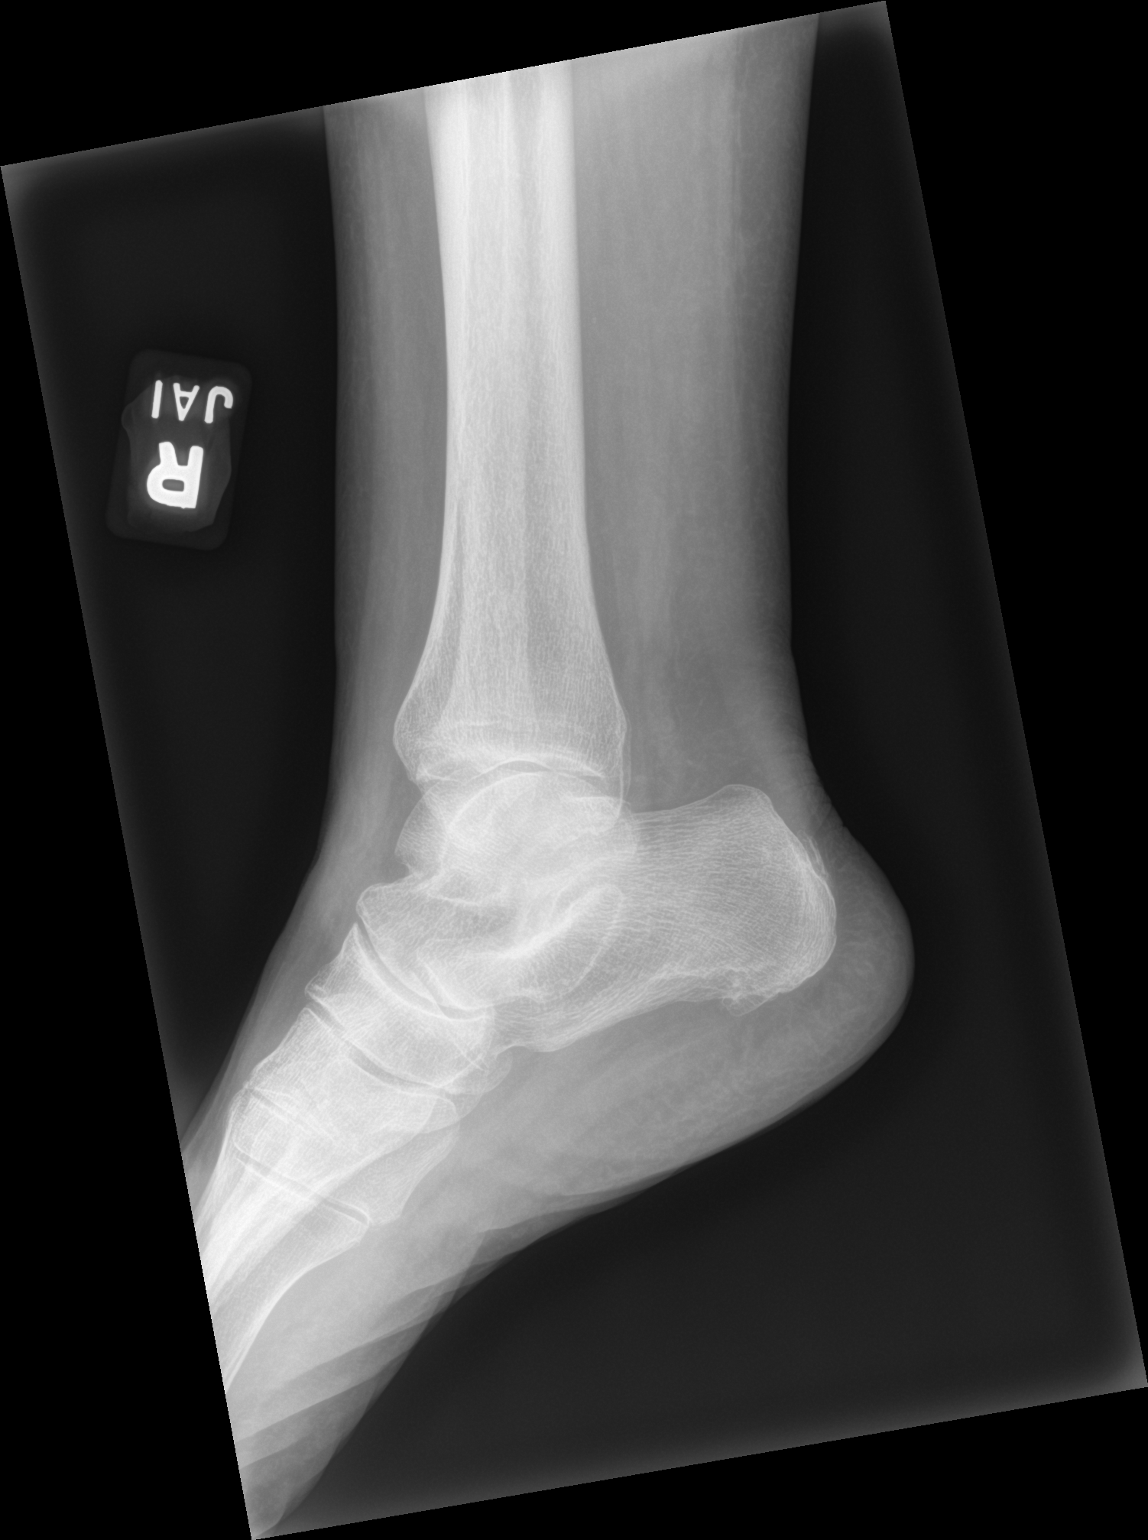

[3 of 3 positions shown; findings below may reference images not displayed]

FINDINGS: No fracture or malalignment. Soft tissue swelling. Small plantar
calcaneal spur. Ankle mortise is symmetric.
IMPRESSION: No acute osseous abnormality

## 2020-11-14 IMAGING — DX DG KNEE AP/LAT W/ SUNRISE*R*
3 series · 3 of 3 positions shown · non-contrast
Comparison: None.

CLINICAL DATA: Right knee pain, no known injury, initial encounter

EXAM:
RIGHT KNEE 3 VIEWS

[knee ap]
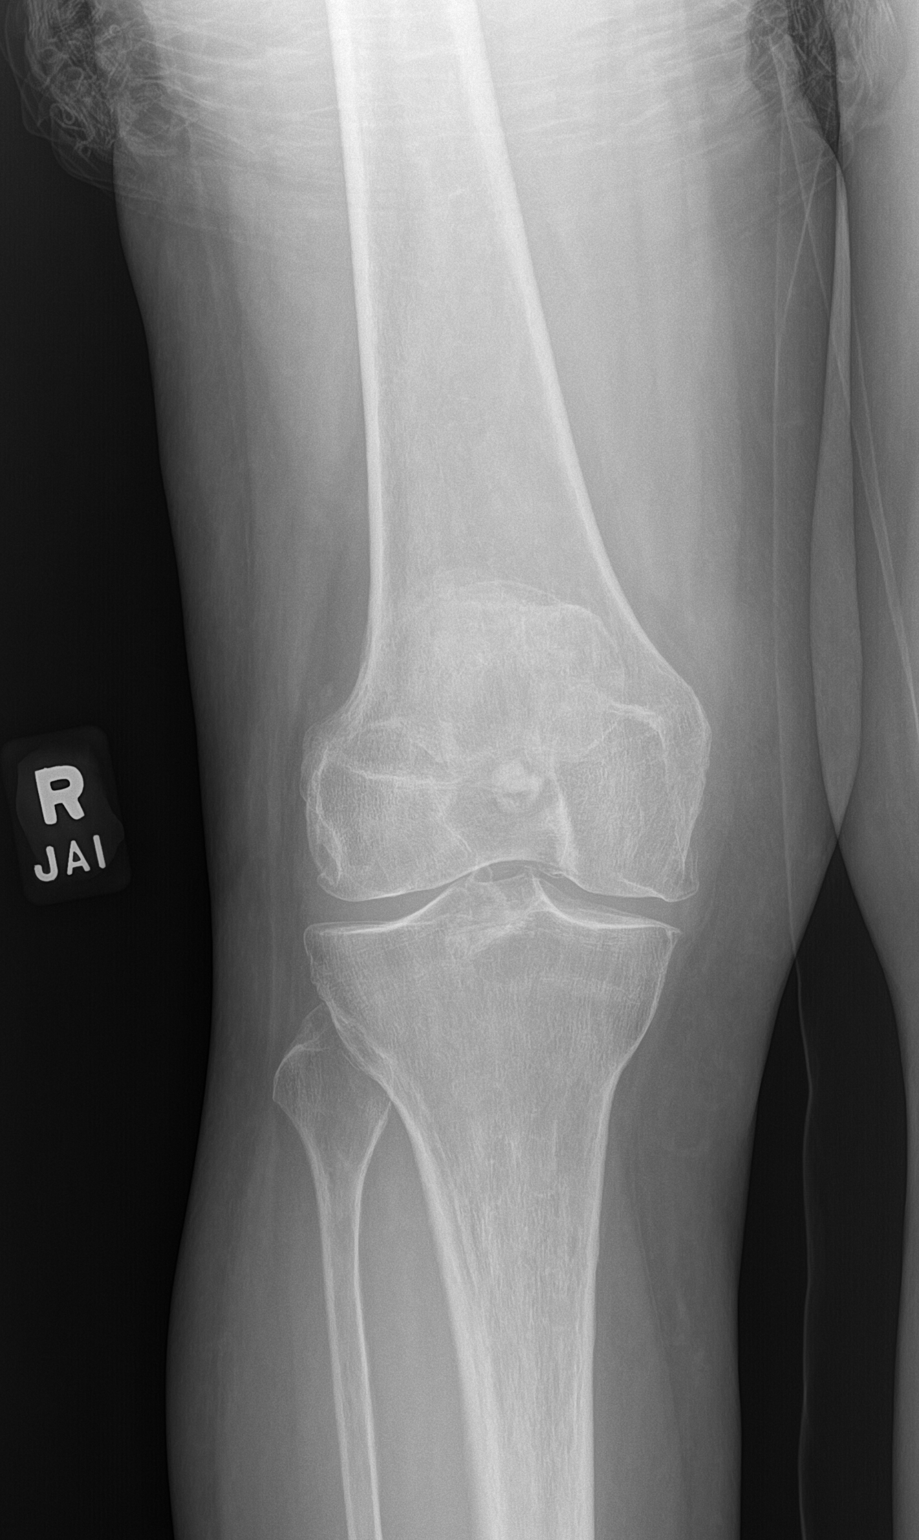

[knee lat]
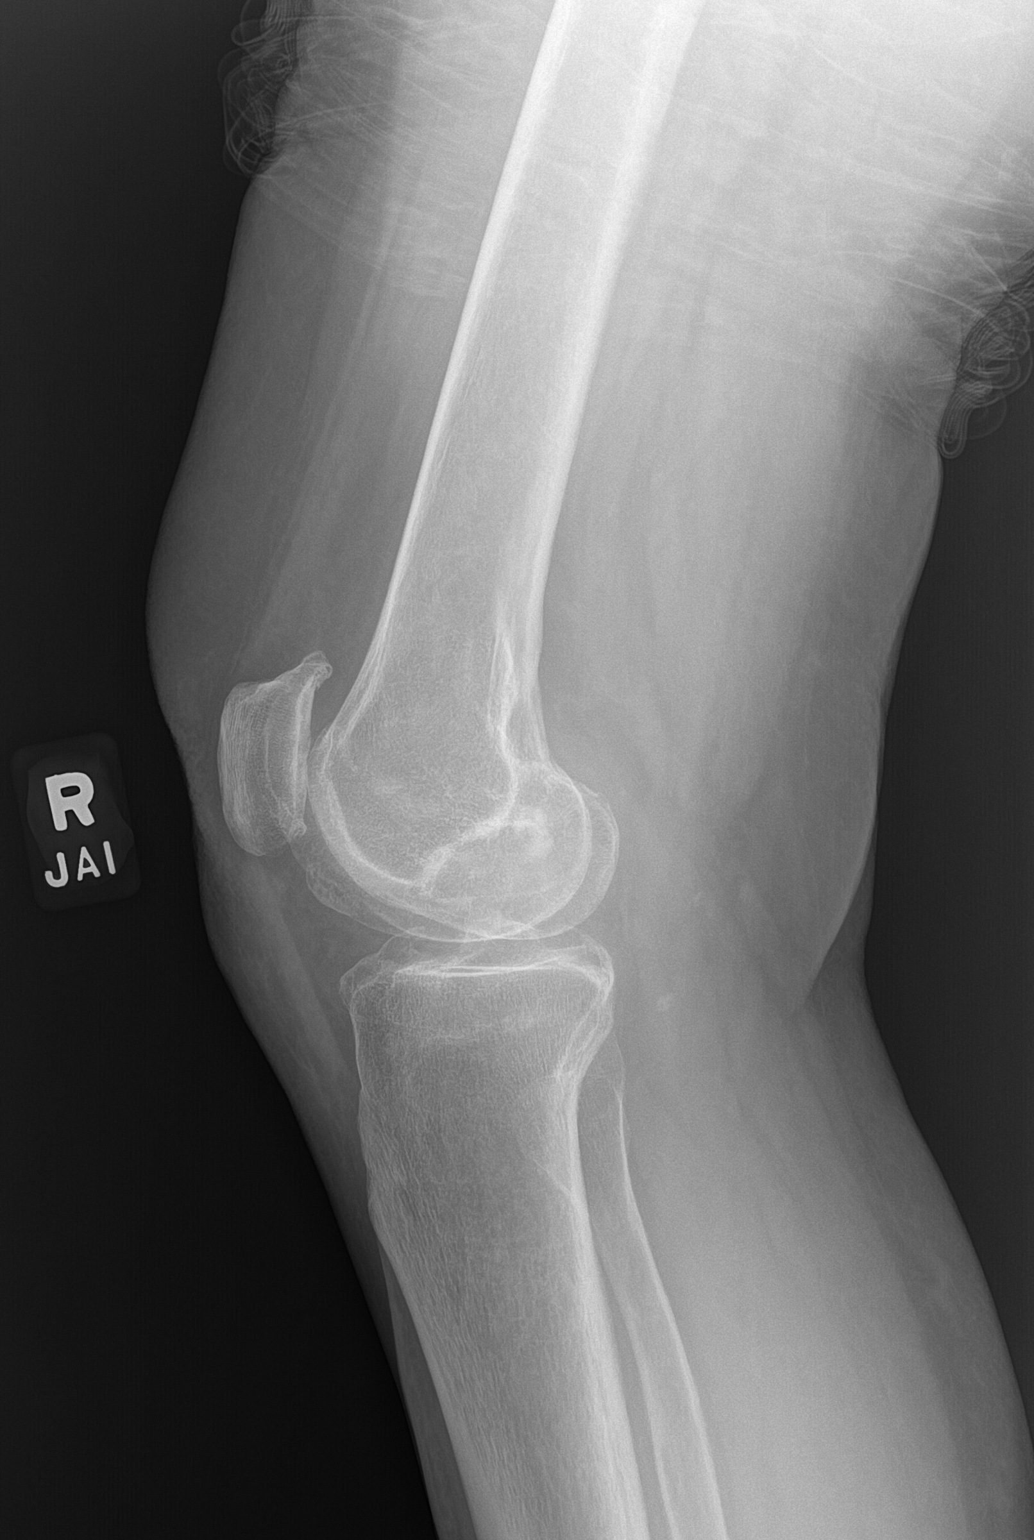

[patella]
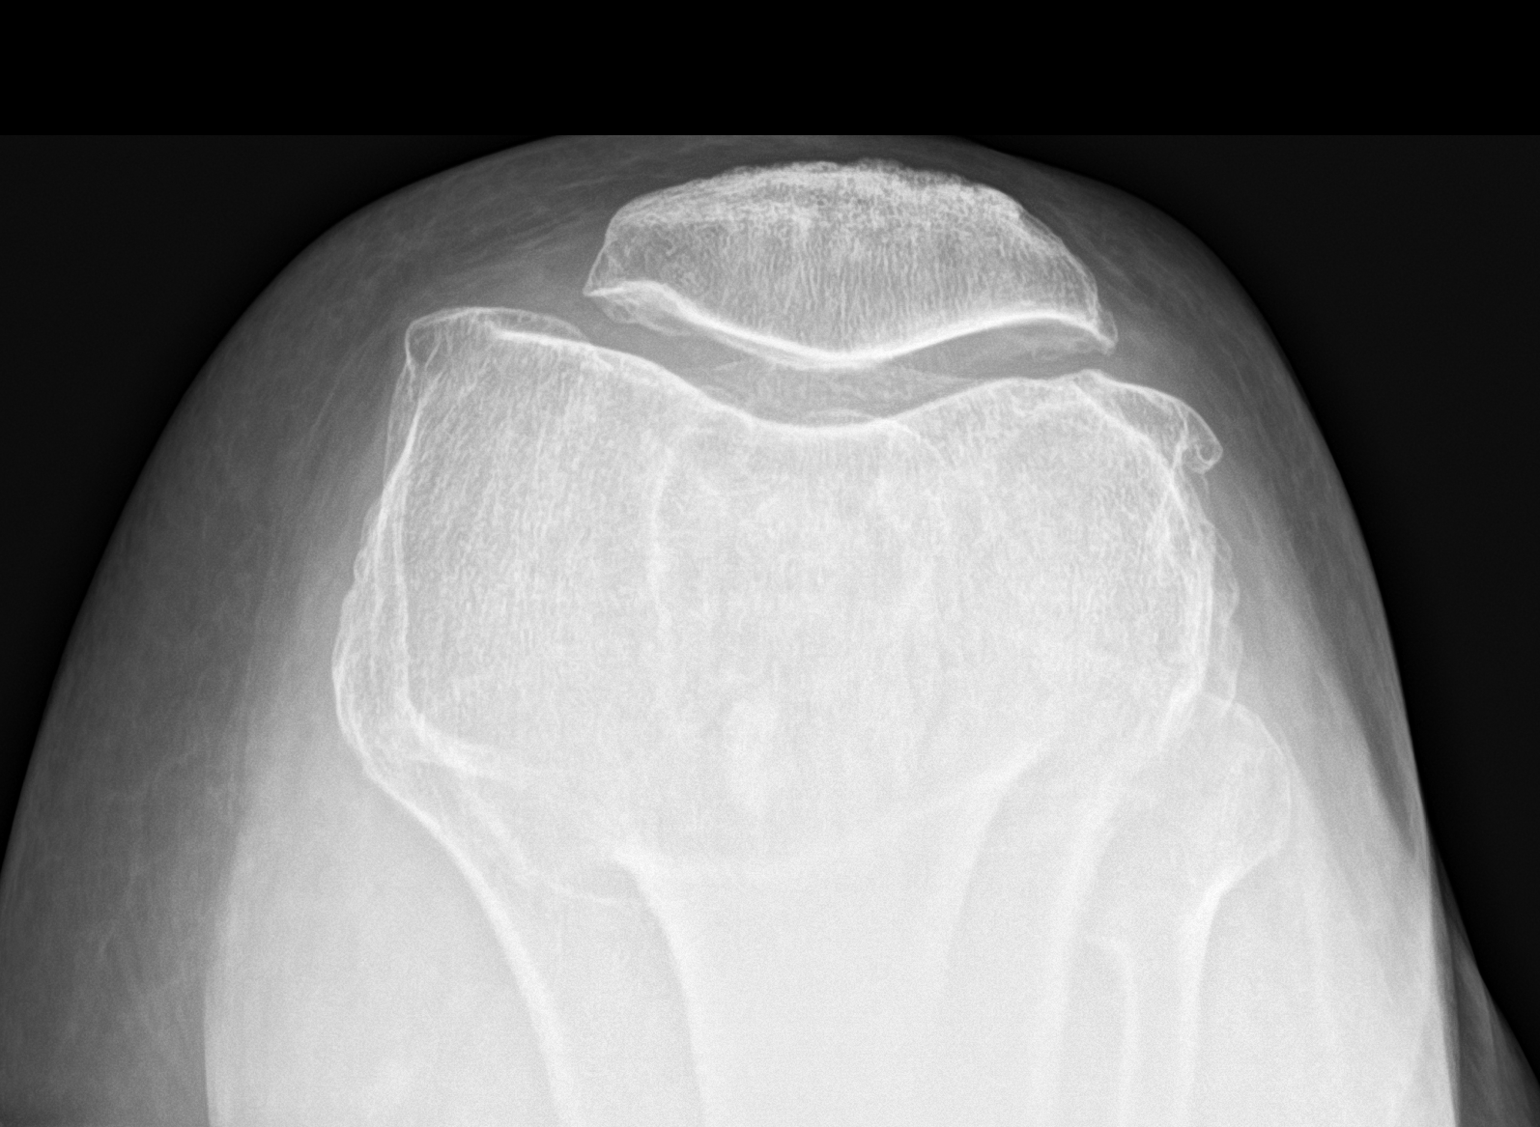

[3 of 3 positions shown; findings below may reference images not displayed]

FINDINGS: Degenerative changes are noted in the medial and patellofemoral
joint spaces. No joint effusion is seen. No acute fracture or
dislocation is noted.
IMPRESSION: Degenerative change without acute abnormality.

## 2020-11-14 IMAGING — DX DG THORACIC SPINE 2V
2 series · 2 of 2 positions shown · non-contrast
Comparison: [DATE]

CLINICAL DATA: Upper back pain over the past several months,
initial encounter

EXAM:
THORACIC SPINE 2 VIEWS

[t-spine ap]
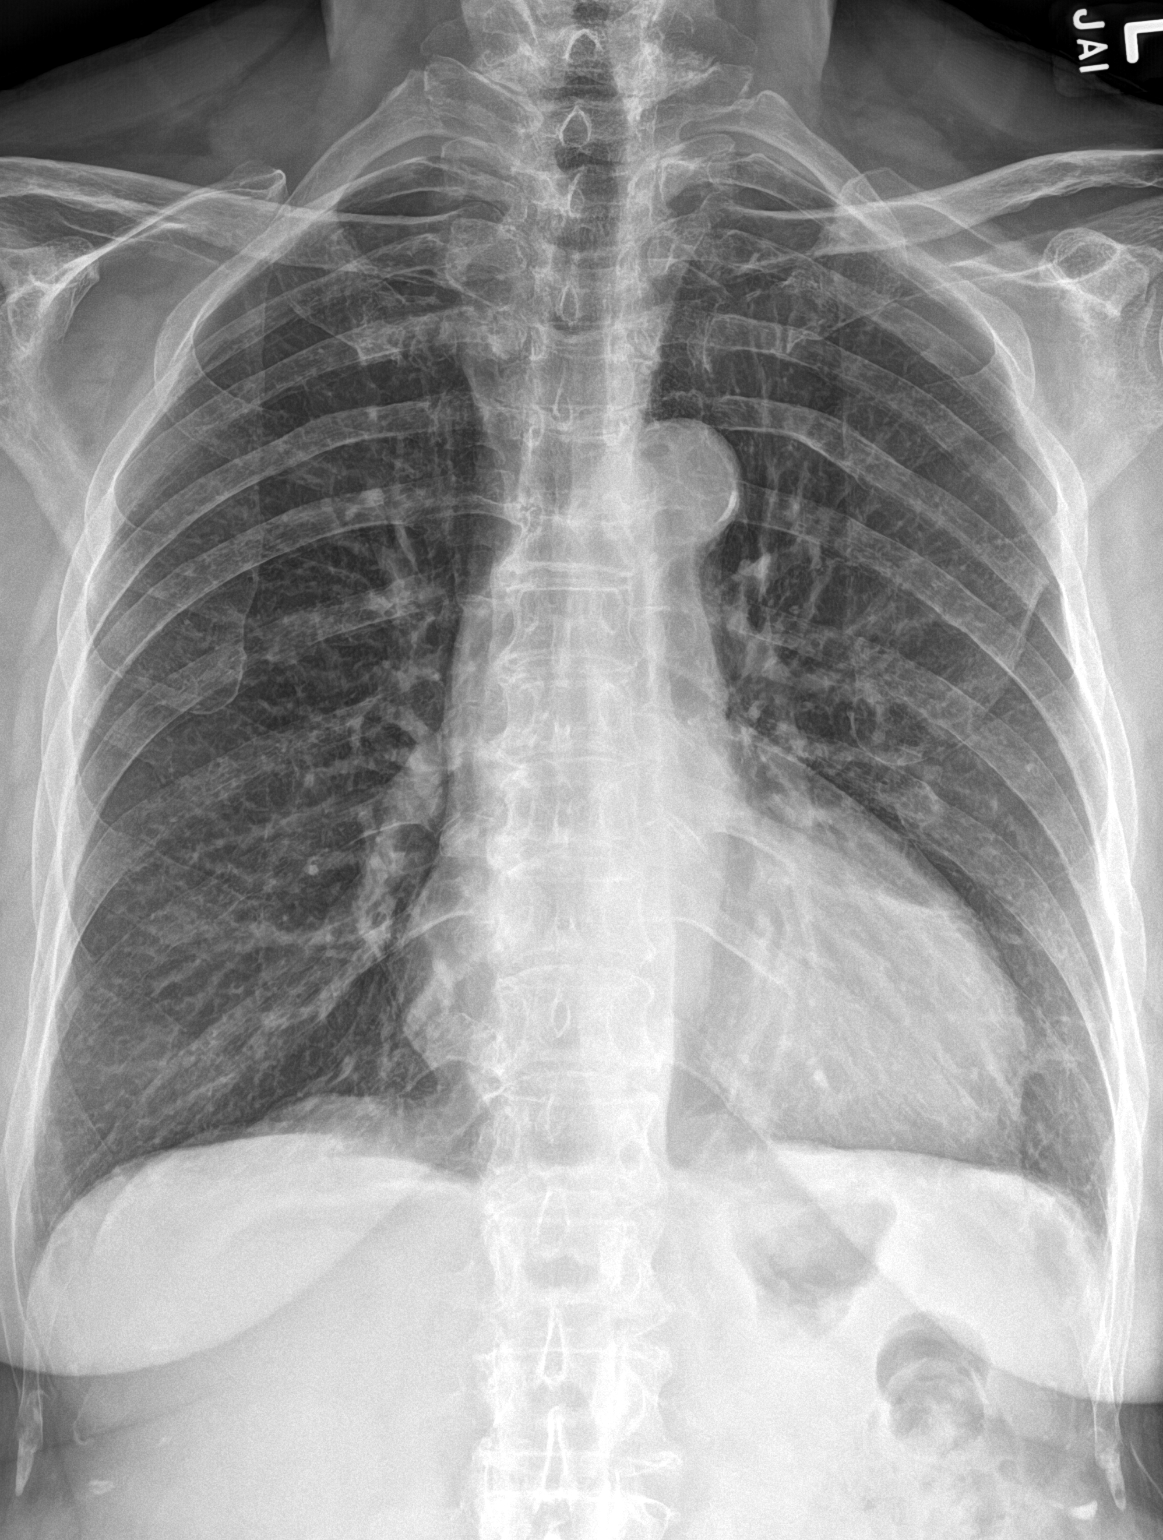

[t-spine lat]
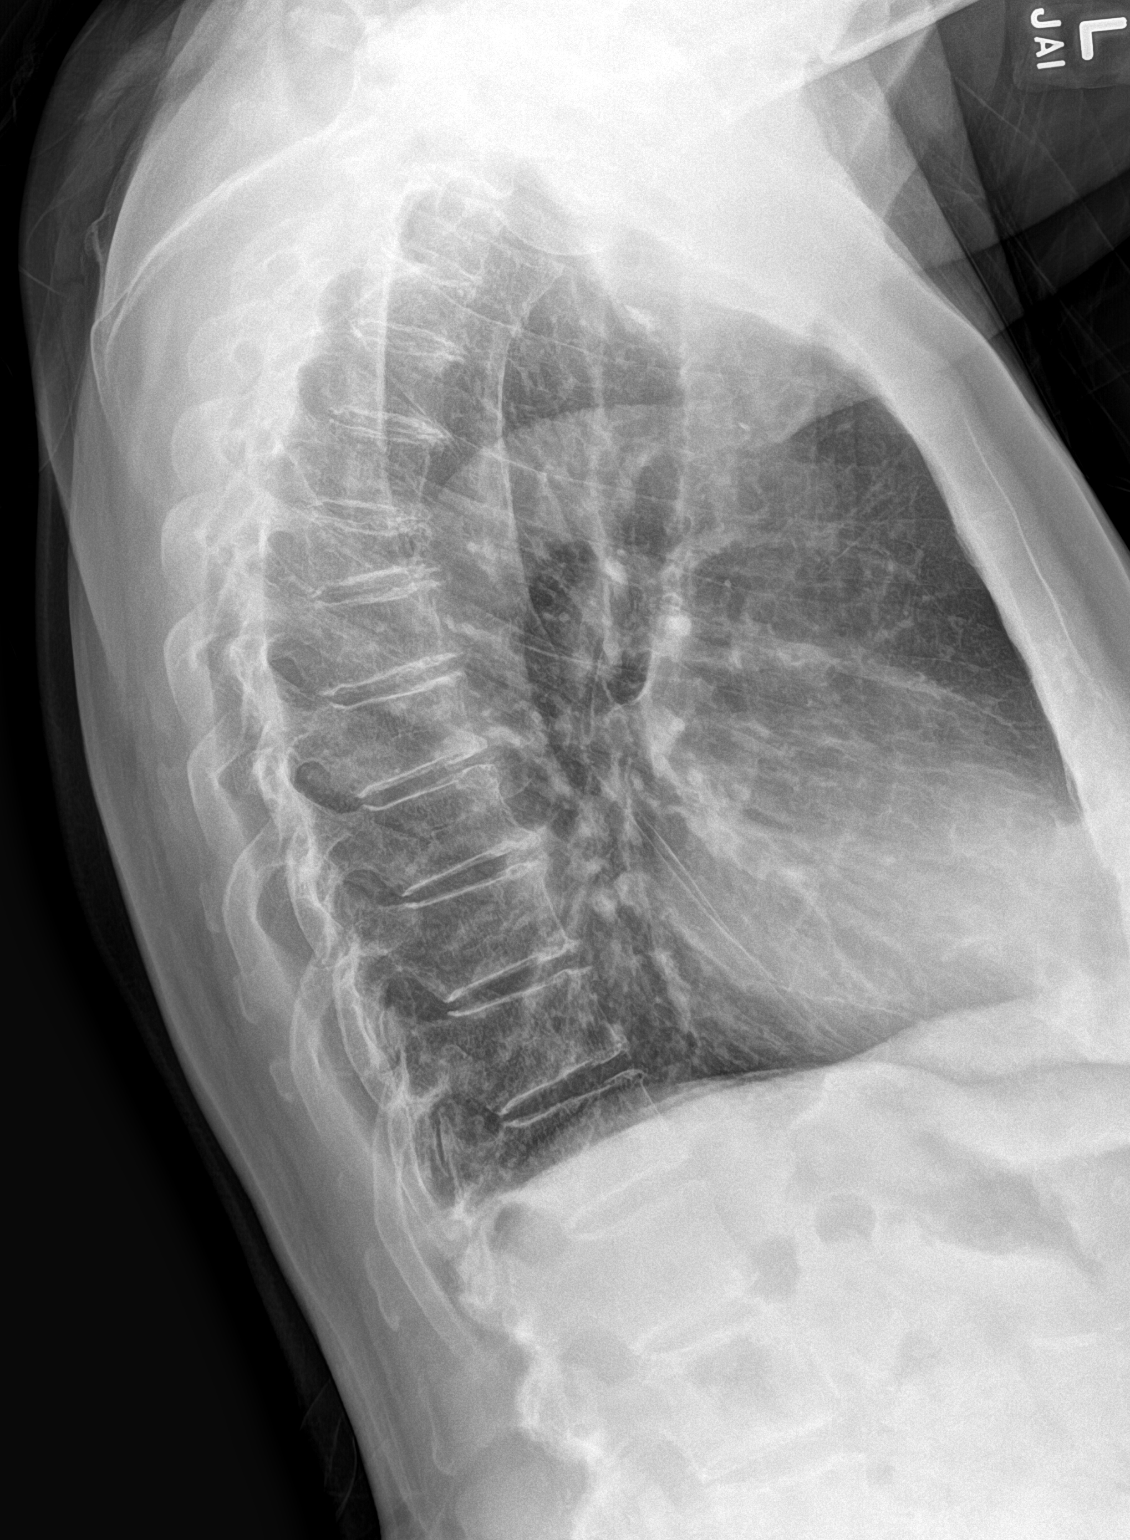

[2 of 2 positions shown; findings below may reference images not displayed]

FINDINGS: Compression deformity is again noted at T3 and T6 similar in
appearance to that noted on the prior exam. No new compression
deformity is seen. No paraspinal mass is noted. Aortic
calcifications are seen. The lungs are clear.
IMPRESSION: Chronic T3 and T6 compression deformities. No acute abnormality
noted.

## 2020-11-14 IMAGING — DX DG LUMBAR SPINE 2-3V
3 series · 3 of 3 positions shown · non-contrast
Comparison: None.

CLINICAL DATA: Chronic low back pain radiating into both hips,
initial encounter

EXAM:
LUMBAR SPINE - 3 VIEW

[l-spine ap]
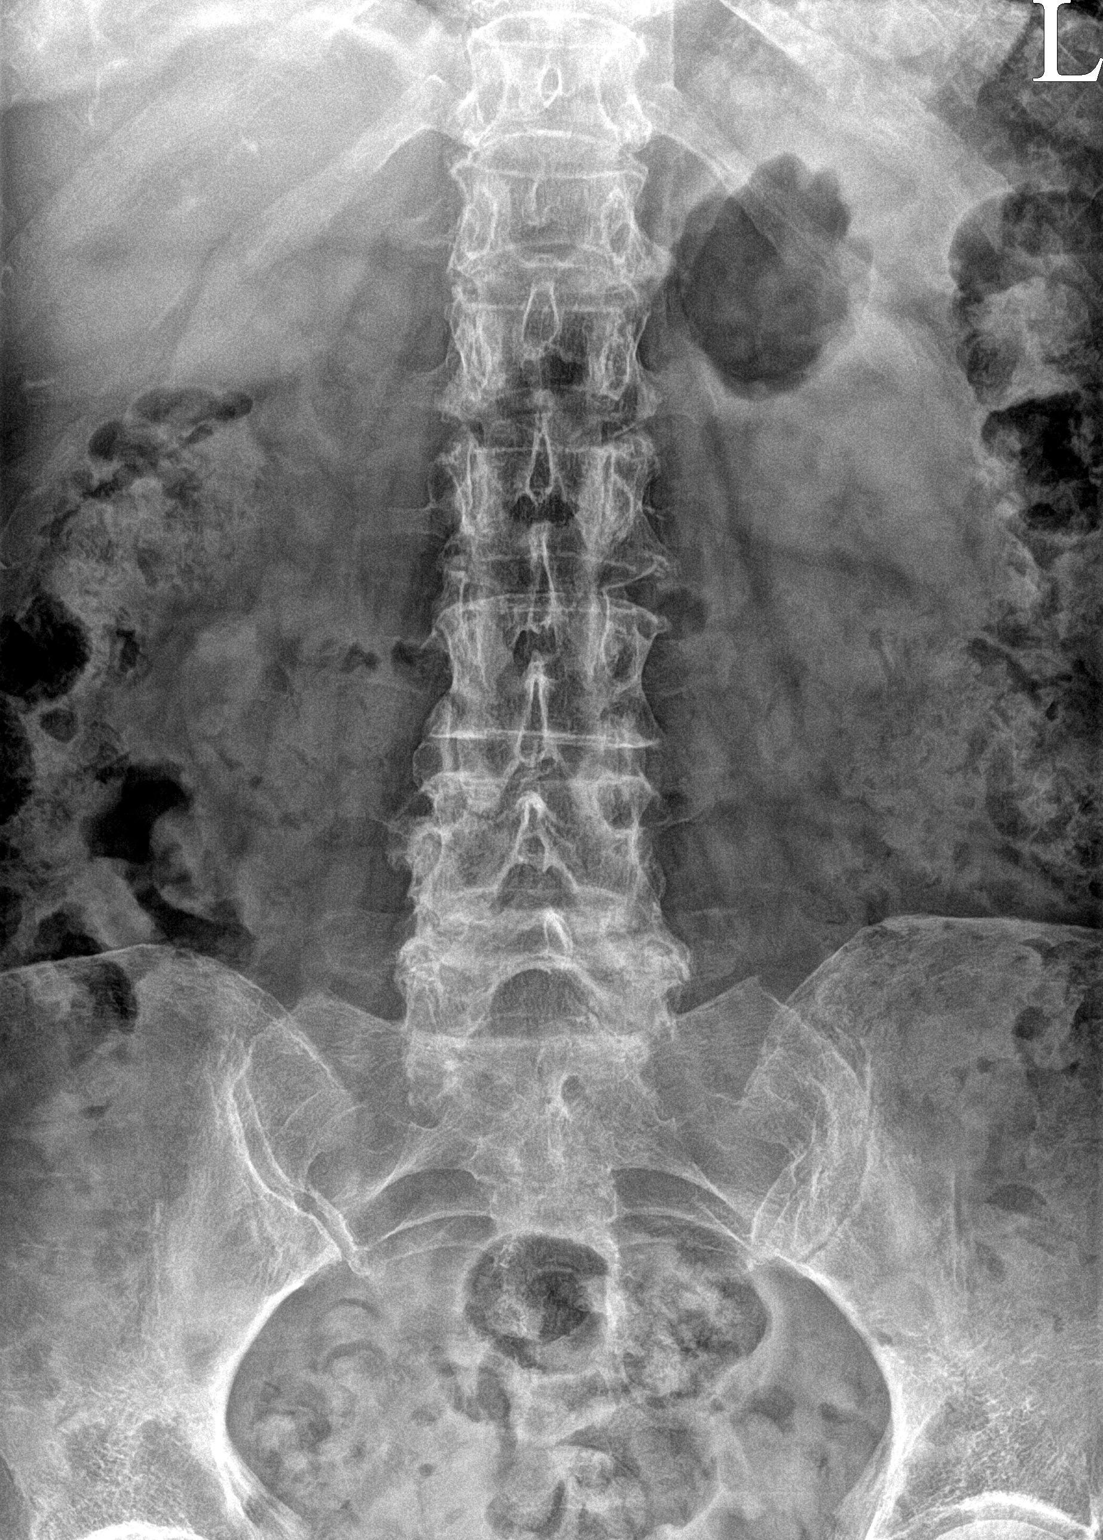

[l-spine lateral (1 of 2)]
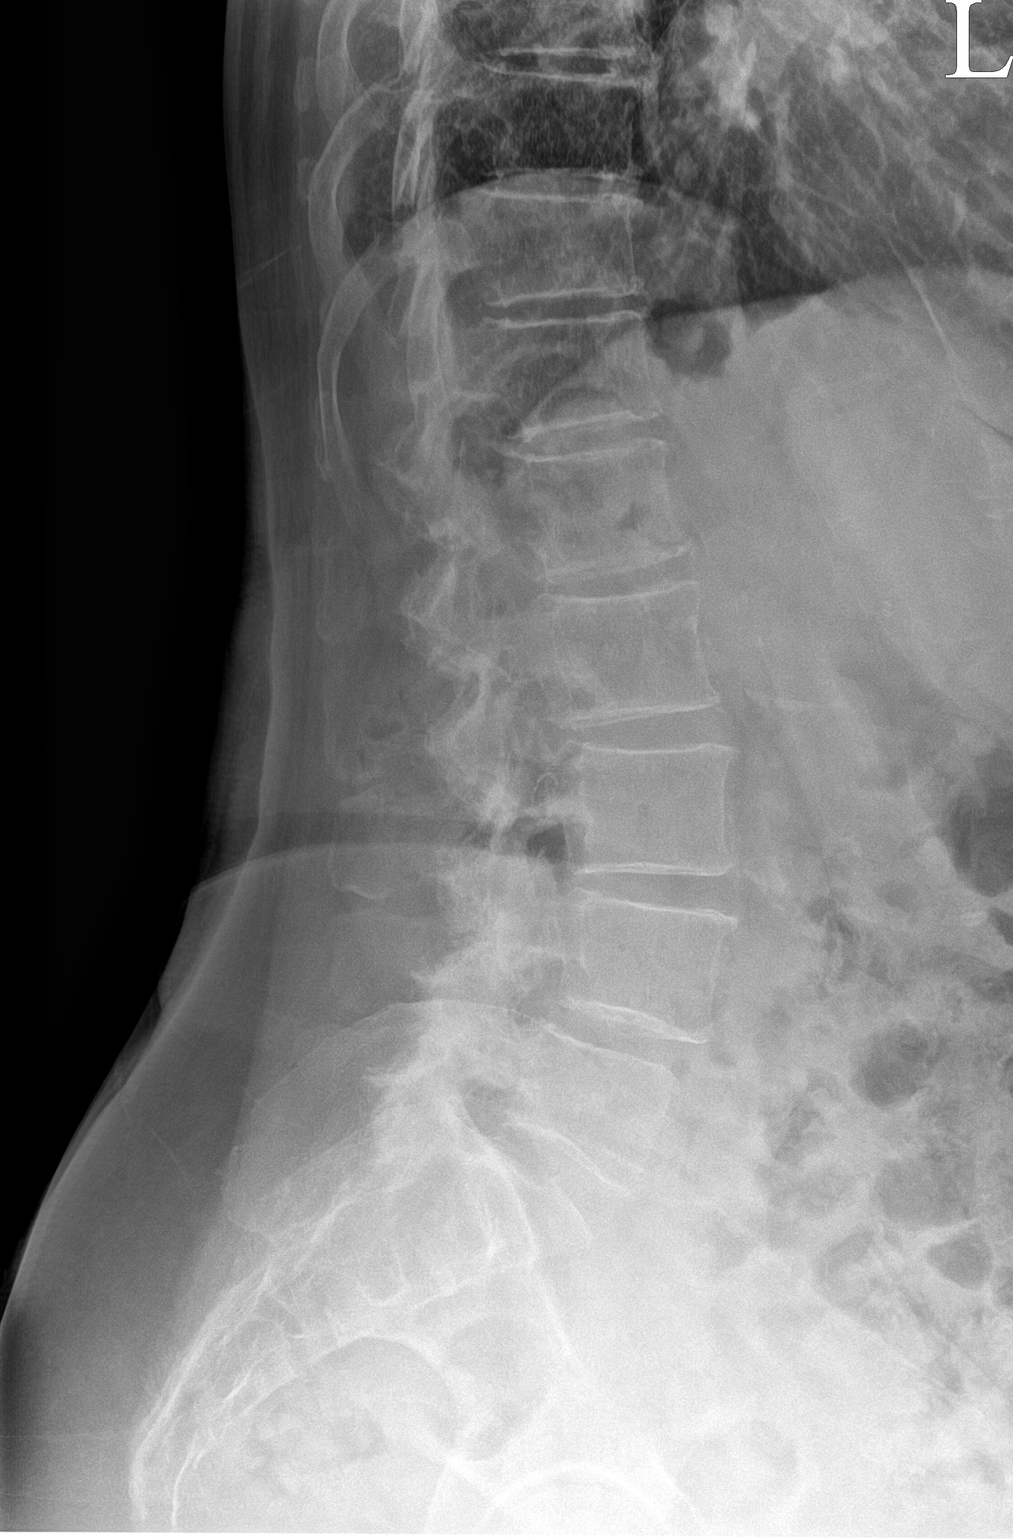

[l-spine lateral (2 of 2)]
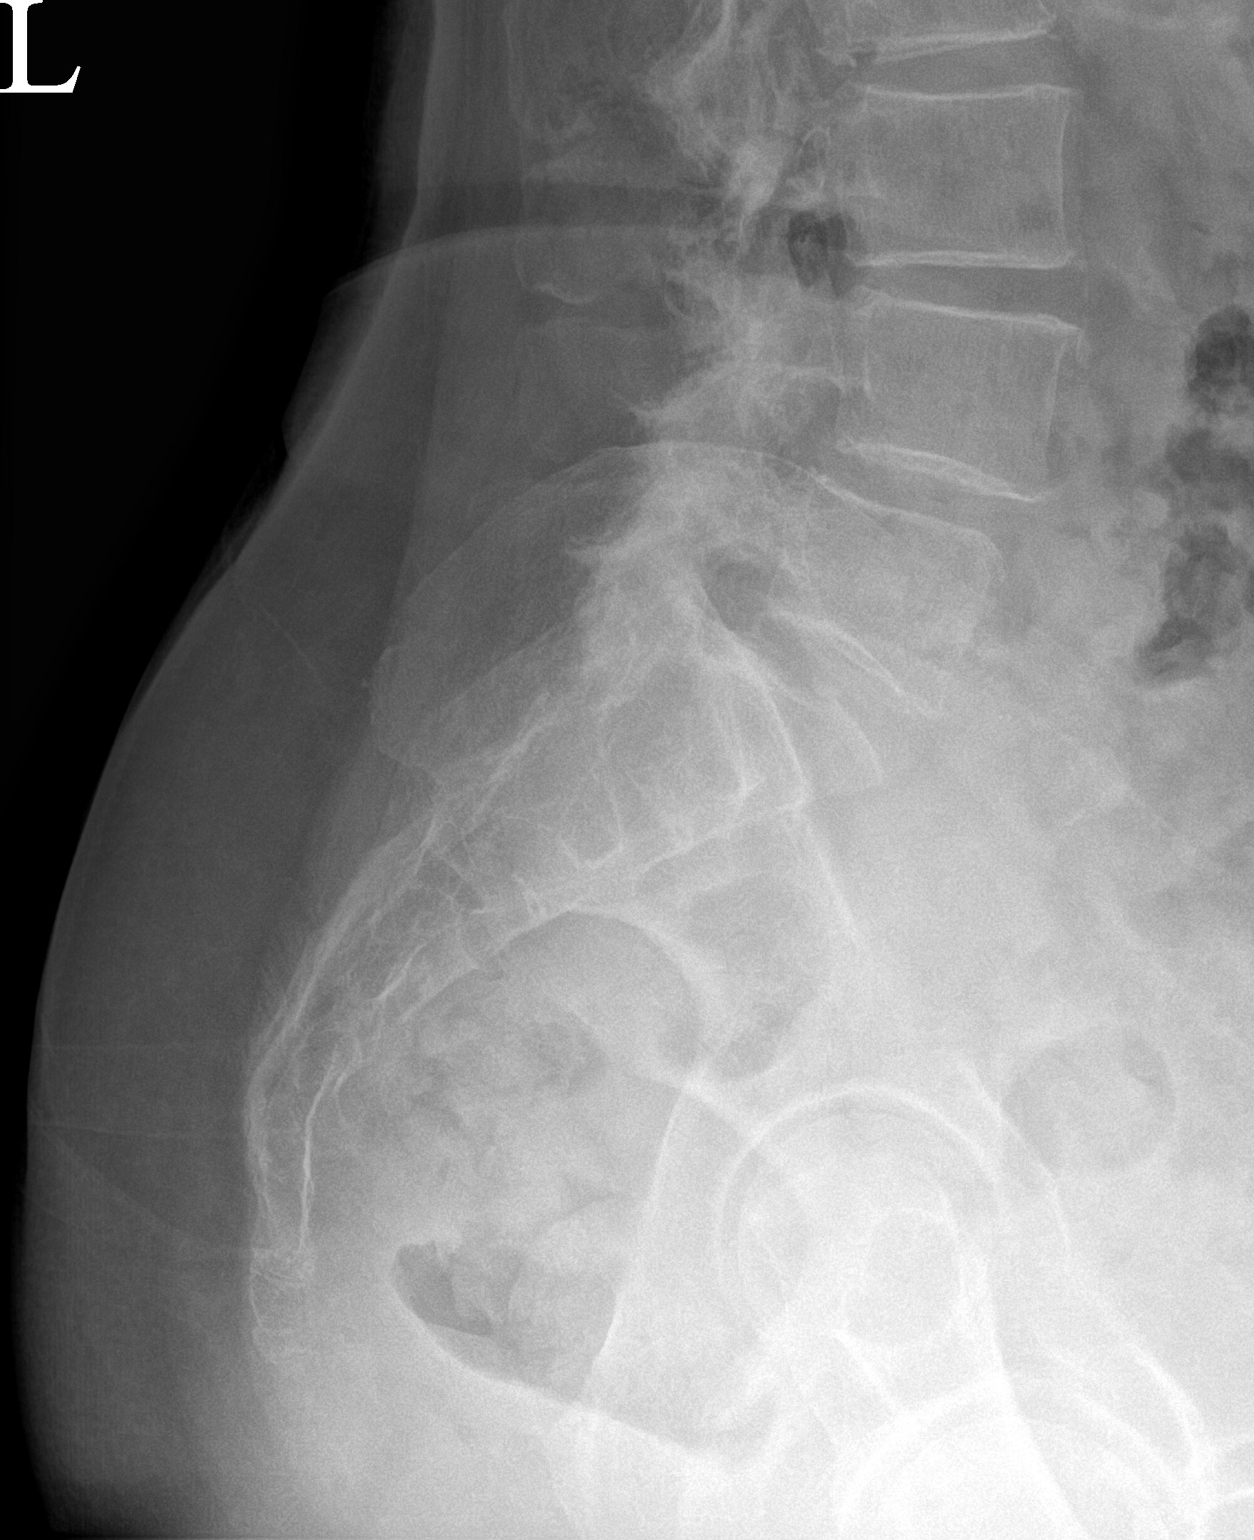

[3 of 3 positions shown; findings below may reference images not displayed]

FINDINGS: Five lumbar type vertebral bodies are well visualized. Vertebral
body height is well maintained. Mild degenerative anterolisthesis of
L4 on L5 is seen. Mild osteophytic changes are noted. Scattered
fecal material is noted throughout the colon without obstructive
change.
IMPRESSION: Mild degenerative change with anterolisthesis of L4 on L5 as
described.

## 2020-11-14 IMAGING — DX DG HIP (WITH OR WITHOUT PELVIS) 2-3V*R*
3 series · 3 of 3 positions shown · non-contrast
Comparison: None.

CLINICAL DATA: Bilateral hip pain, initial encounter

EXAM:
DG HIP (WITH OR WITHOUT PELVIS) 3V RIGHT

[pelvis ap]
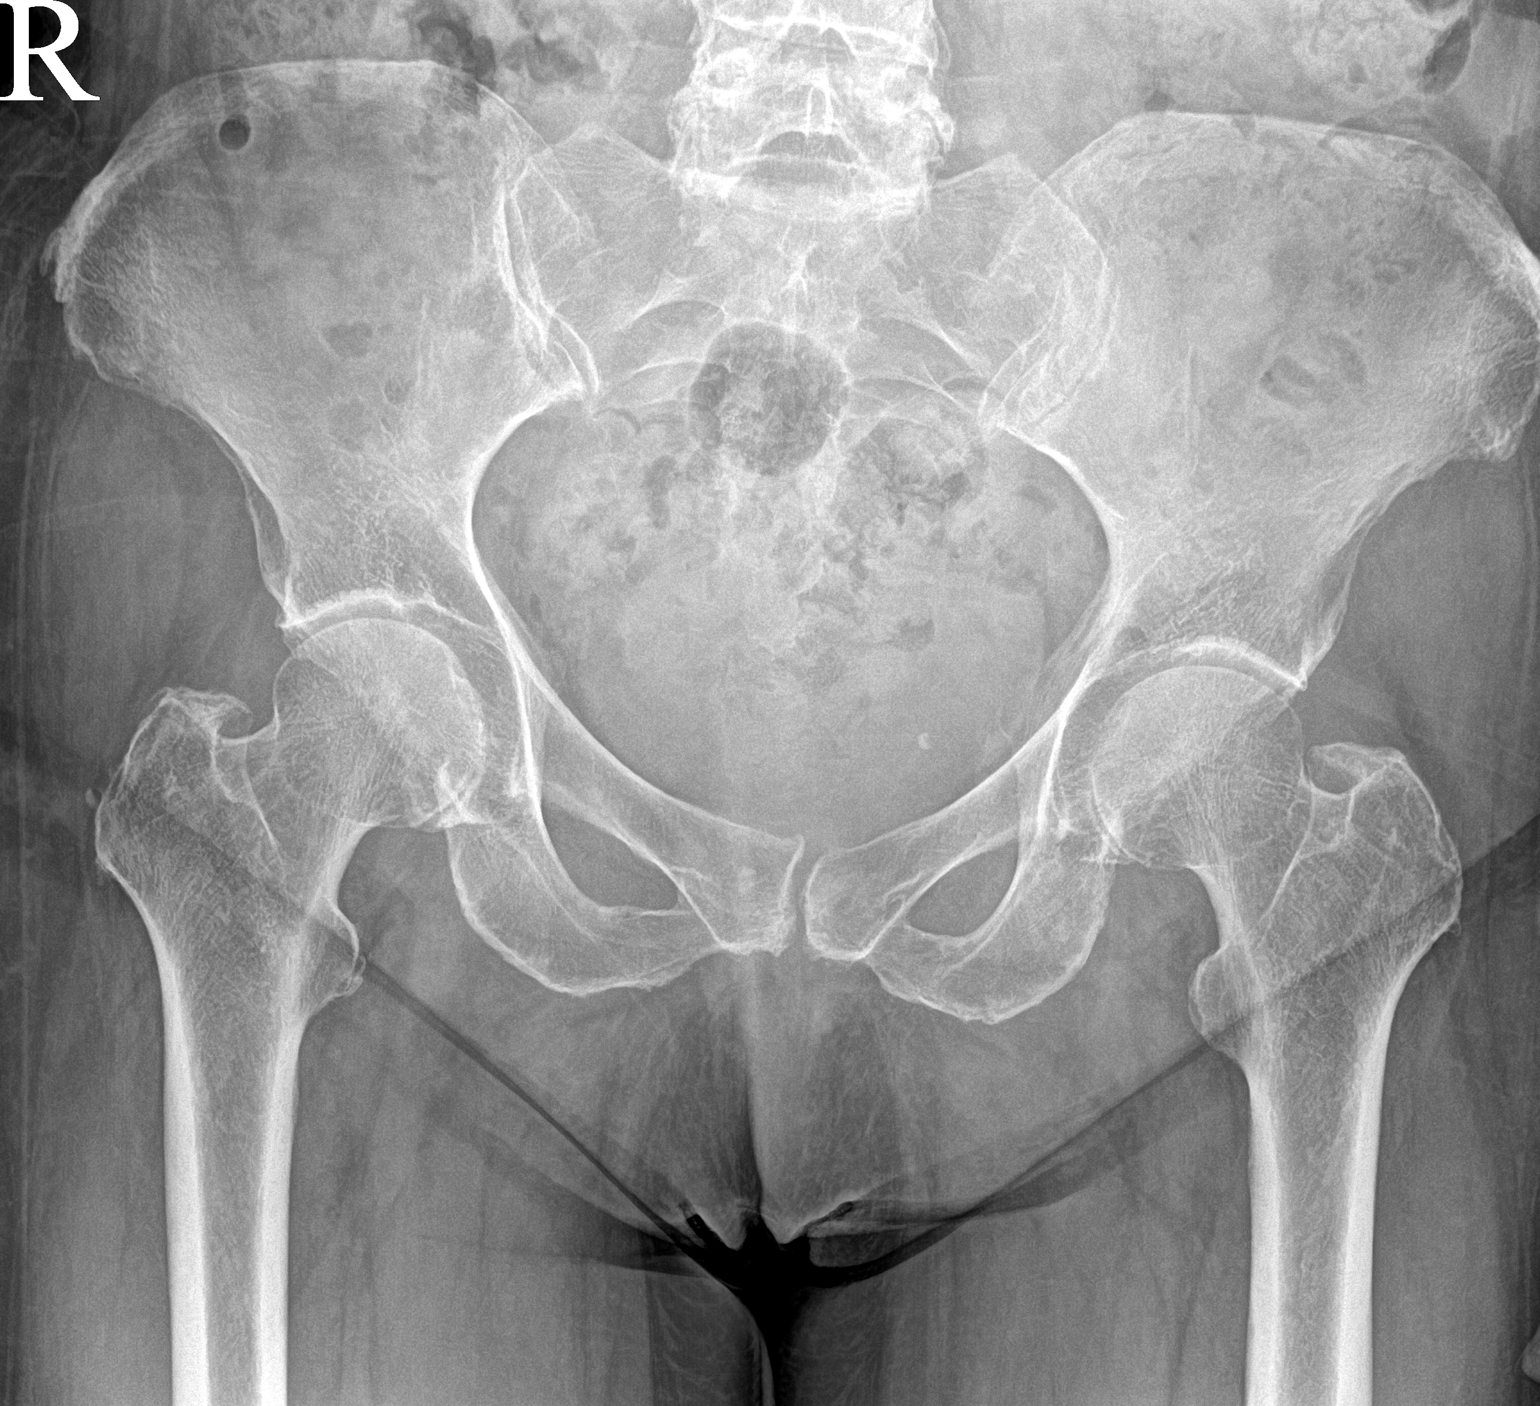

[hip ap]
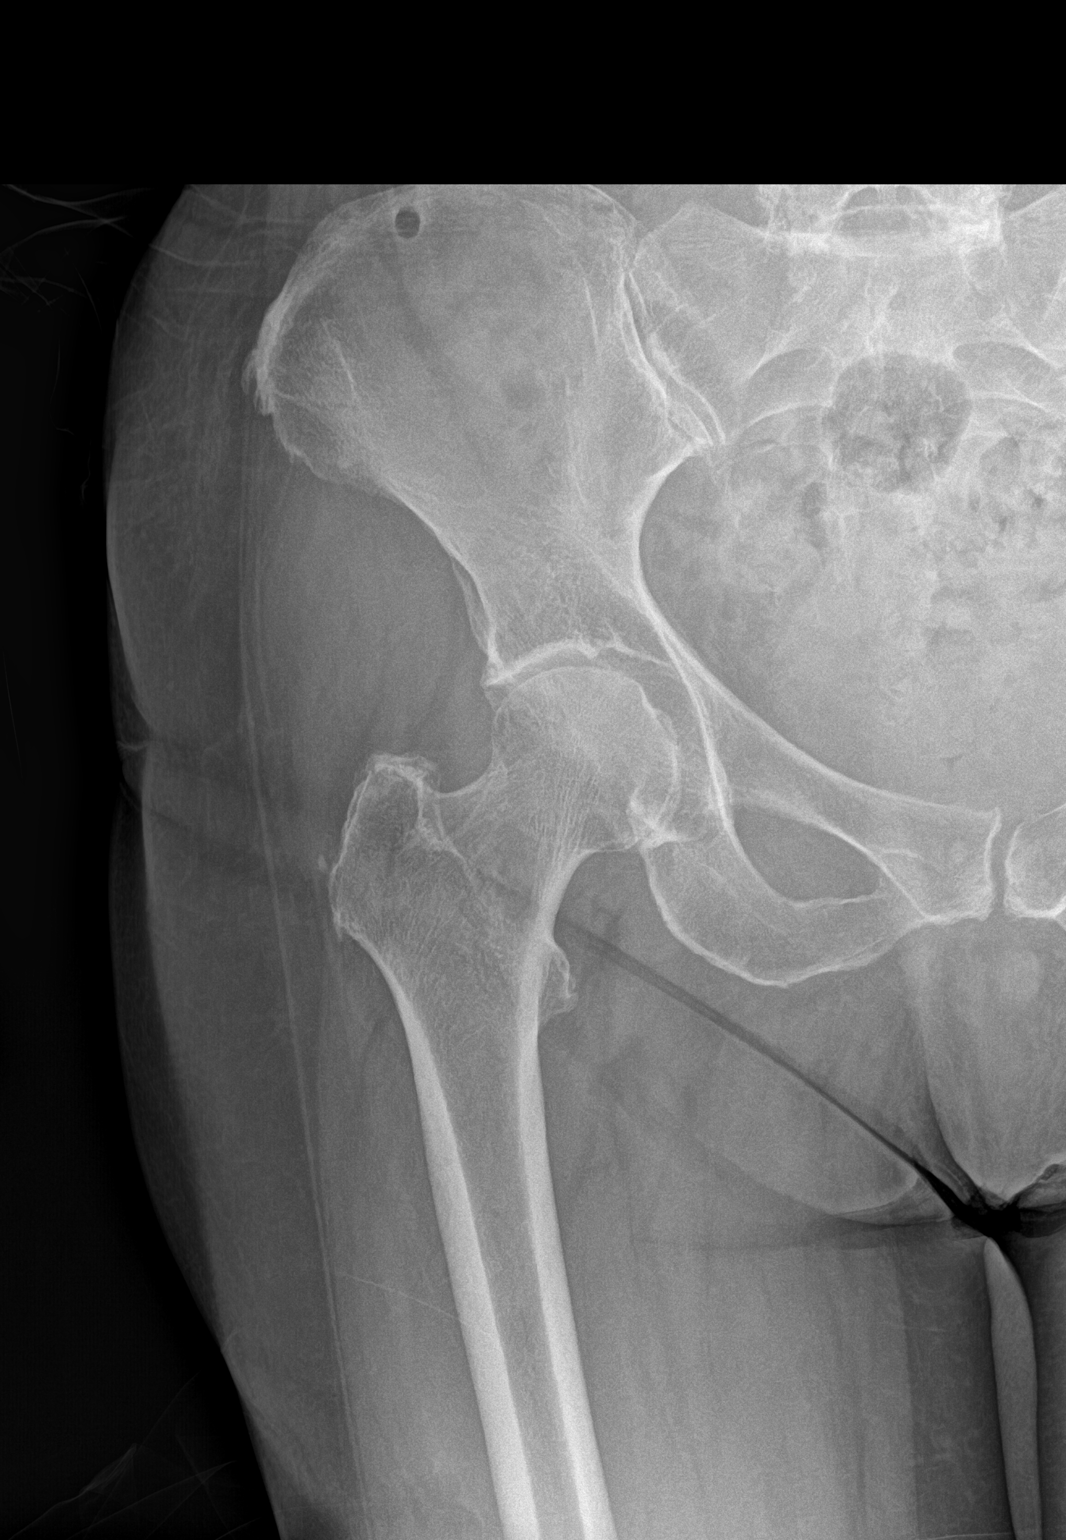

[hip frog leg]
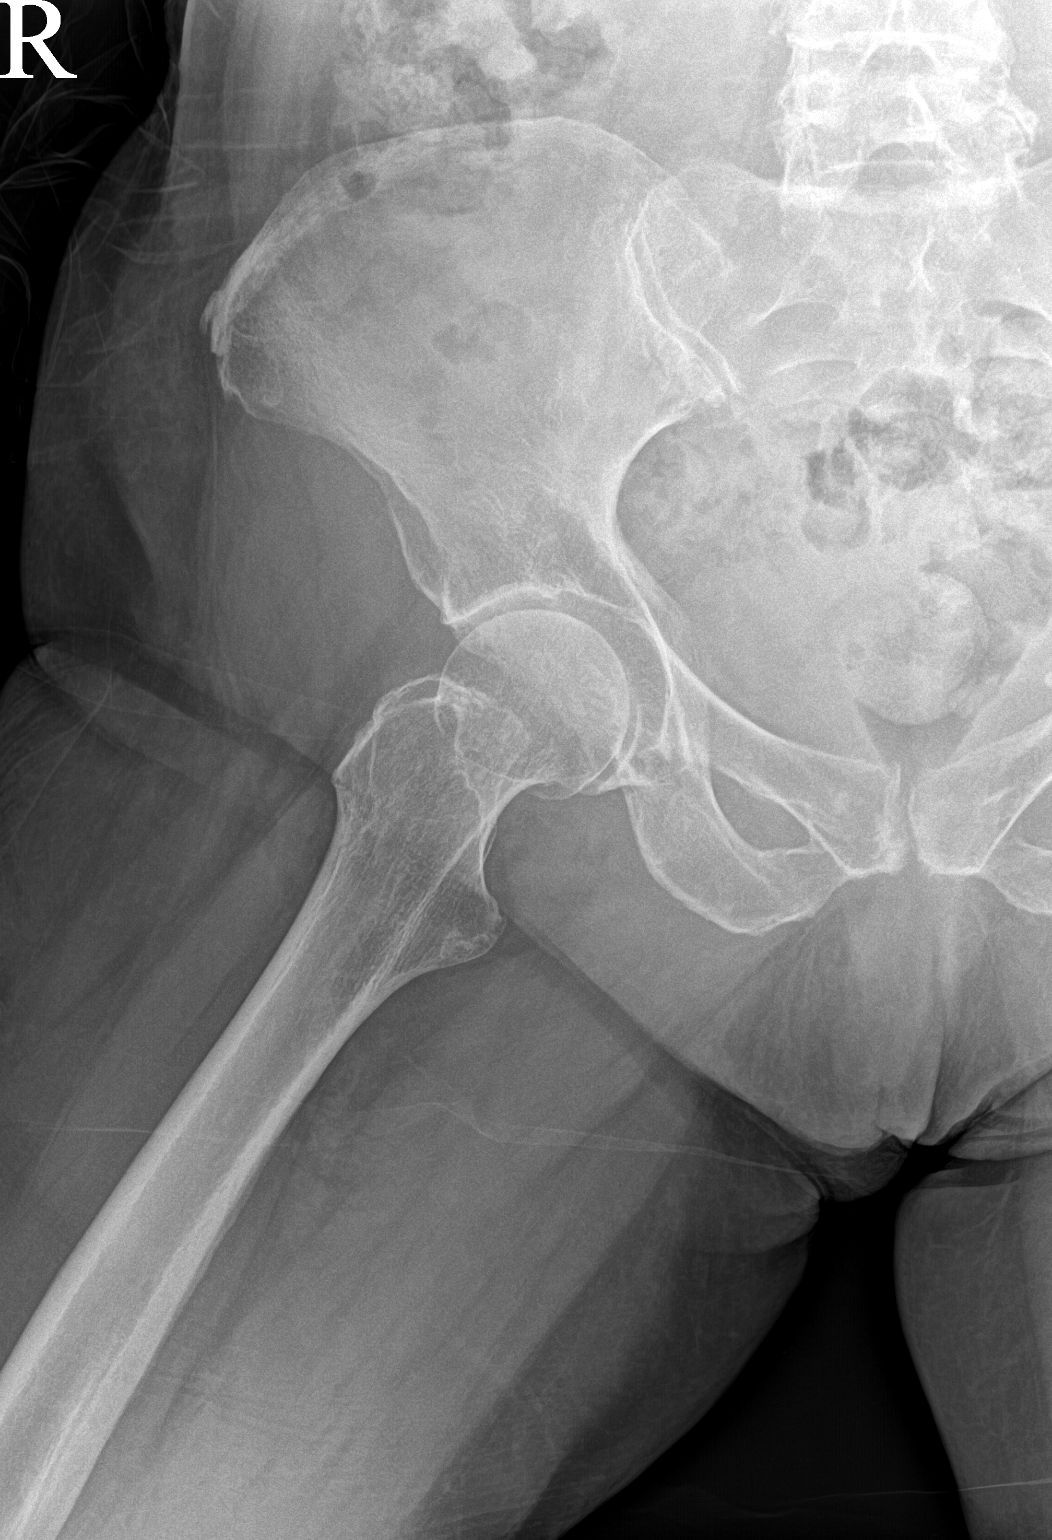

[3 of 3 positions shown; findings below may reference images not displayed]

FINDINGS: Pelvic ring is intact. Minimal degenerative changes of the hip
joints are noted. Proximal right femur appears within normal limits.
No soft tissue abnormality is noted.
IMPRESSION: Mild degenerative change without acute abnormality.

## 2020-11-14 NOTE — Patient Instructions (Addendum)
Thank you for coming in today.   Please get an Xray today before you leave   I've referred you to Physical Therapy.  Let us know if you don't hear from them in one week.   Recheck in 3-4 weeks.   Heating pad  Electric Heating Pad, [id number]" Extra Large Wearable Heated Pad for Neck and Shoulders Back Pain and Cramps Relief, with 6 Temperature Levels, 2 Hours Auto Shut-Off, Machine [employer]

## 2020-11-15 NOTE — Progress Notes (Signed)
Right knee x-ray shows some arthritis changes present.

## 2020-11-15 NOTE — Progress Notes (Signed)
Right ankle x-ray shows no fractures.  Soft tissue swelling is present.

## 2020-11-15 NOTE — Progress Notes (Signed)
Lumbar spine x-ray shows mild degenerative changes and anterior listhesis at L4 on L5.

## 2020-11-15 NOTE — Progress Notes (Signed)
X-ray thoracic spine shows chronic compression fractures at T3 and T6.  No new acute changes are present.

## 2020-11-15 NOTE — Progress Notes (Signed)
Right hip x-ray shows mild arthritis type changes.  No acute abnormality such as fracture are present.

## 2020-11-26 ENCOUNTER — Encounter: Payer: Self-pay | Admitting: Physical Therapy

## 2020-11-26 ENCOUNTER — Other Ambulatory Visit: Payer: Self-pay

## 2020-11-26 ENCOUNTER — Ambulatory Visit (INDEPENDENT_AMBULATORY_CARE_PROVIDER_SITE_OTHER): Payer: Medicare Other | Admitting: Physical Therapy

## 2020-11-26 DIAGNOSIS — M62838 Other muscle spasm: Secondary | ICD-10-CM | POA: Diagnosis not present

## 2020-11-26 DIAGNOSIS — M542 Cervicalgia: Secondary | ICD-10-CM | POA: Diagnosis not present

## 2020-11-26 DIAGNOSIS — M546 Pain in thoracic spine: Secondary | ICD-10-CM

## 2020-11-26 NOTE — Therapy (Signed)
[place] [facility] [place] [address] [place], [place], [address] Phone: [phone]   Fax:  [phone]  Physical Therapy Evaluation  Patient Details  Name: Heather Hodges MRN: [id number] Date of Birth: 16-Oct-1945 Referring Provider (PT): [name]   Encounter Date: 11/26/2020   PT End of Session - 11/26/20 1132     Visit Number 1    Number of Visits 12    Date for PT Re-Evaluation 01/07/21    Authorization Type Medicare    PT Start Time 1022    PT Stop Time 1102    PT Time Calculation (min) 40 min    Activity Tolerance Patient tolerated treatment well;Patient limited by pain    Behavior During Therapy [facility] for tasks assessed/performed             Past Medical History:  Diagnosis Date   Arthritis    Colon polyps    Diverticulosis    History of chicken pox    Hyperlipidemia    Hypothyroidism    Osteoporosis    Vitamin D deficiency     Past Surgical History:  Procedure Laterality Date   TONSILLECTOMY AND ADENOIDECTOMY      There were no vitals filed for this visit.    Subjective Assessment - 11/26/20 1029     Subjective Pt states fall from ladder about 3 years ago, that was start of alot of her pain. She has old compression fx at T6 region. Pt states pain in several areas, but mostly in neck, into head, and in thoracic spine. Has pain wrapping around under breasts with UE flexion and increased use.Also states hands going numb at times lately. Other areas of pain include low back, R knee>L, R hip and R ankle.    Limitations Sitting;Reading;Lifting;House hold activities;Standing    Patient Stated Goals decreased pain.    Currently in Pain? Yes    Pain Score 7     Pain Location Back    Pain Orientation Right;Left;Mid    Pain Descriptors / Indicators Aching    Pain Type Chronic pain    Pain Onset More than a month ago    Pain Frequency Intermittent    Aggravating Factors  most activity    Pain Relieving Factors heat, meds,     Multiple Pain Sites Yes    Pain Score 4    Pain Location Neck    Pain Orientation Left;Right    Pain Descriptors / Indicators Aching;Tightness    Pain Type Chronic pain    Pain Onset More than a month ago    Pain Frequency Intermittent    Aggravating Factors  cervical rotation    Pain Relieving Factors heat, meds.                [facility] PT Assessment - 11/26/20 0001       Assessment   Medical Diagnosis Cervcial/Thoracic pain    Referring Provider (PT) [name]    Prior Therapy 3 years ago      Precautions   Precaution Comments difficulty laying supine/flat due to neck pain/headache.      Balance Screen   Has the patient fallen in the past 6 months No      Prior Function   Level of Independence Independent      Cognition   Overall Cognitive Status Within Functional Limits for tasks assessed      ROM / Strength   AROM / PROM / Strength AROM;Strength      AROM  Overall AROM Comments mild limitations for shoulder flexion/ mod limitaiton for Abd, mild stiffness in bil GHJ.    AROM Assessment Site Cervical;Lumbar    Cervical Flexion 25    Cervical Extension sig limitation    Cervical - Left Side Bend --    Cervical - Right Rotation 30    Cervical - Left Rotation 30    Lumbar Flexion 60    Lumbar Extension mod limitation      Strength   Overall Strength Comments Shoulders: 4-/5,      Palpation   Palpation comment Significant muscle tension and spasm on L>R  cervical musculature, paraspinals and UTs. Tenderness in these areas, as well as Sub occipitals, Tenderness in T-spine with palpation.                        Objective measurements completed on examination: See above findings.       [facility] PT Treatment/Exercise - 11/26/20 0001       Exercises   Exercises Neck      Neck Exercises: Seated   Shoulder Rolls 10 reps    Other Seated Exercise scap retract x 10;      Neck Exercises: Supine   Other Supine Exercise shoulder flexion  with cane x 10;      Neck Exercises: Stretches   Upper Trapezius Stretch 2 reps;20 seconds    Upper Trapezius Stretch Limitations on L;                    PT Education - 11/26/20 1121     Education Details PT POC, Exam findings, HEP    Person(s) Educated Patient    Methods Explanation;Demonstration;Tactile cues;Verbal cues;Handout    Comprehension Verbalized understanding;Returned demonstration;Verbal cues required;Tactile cues required;Need further instruction              PT Short Term Goals - 11/26/20 1211       PT SHORT TERM GOAL #1   Title Pt to be independent with initial HEP    Time 2    Period Weeks    Status New    Target Date 12/10/20               PT Long Term Goals - 11/26/20 1213       PT LONG TERM GOAL #1   Title Pt to be independent with final HEP    Time 6    Period Weeks    Status New    Target Date 01/07/21      PT LONG TERM GOAL #2   Title Pt to report decreased soreness and tension in neck, to 0-2/10 with movment. 6    Time 6    Period Weeks    Status New    Target Date 01/07/21      PT LONG TERM GOAL #3   Title Pt to demo improved cervical rotation to at least 45 deg bilaterally, to improve ability for head turns and IADLS.    Time 6    Period Weeks    Status New    Target Date 01/07/21      PT LONG TERM GOAL #4   Title Pt to demo improved soft tissue restrictions in cervical and thoracic region to be [facility] for decreased pain.    Time 6    Period Weeks    Status New    Target Date 01/07/21      PT LONG TERM GOAL #5  Title Pt to demo ability for shoulder elevation and strengthening without pain in thoracic spine , to improve ability for IADLs.    Time 6    Period Weeks    Status New    Target Date 01/07/21                    Plan - 11/26/20 1152     Clinical Impression Statement Pt presents with primary complaint of increased pain in cervical and thoracic region. Pt with significant muscle tension  and guarding in cervical region, in UTs, paraspinals, and sub occipitals. Pt with increased pain in head with cervical motions and positions today, likley from significant muscle tension. Pt with decreased ROM for cervical, thoracic spine, and shoulders, with increased pain with movement. She also has weakness in shoulder and postural muscles as well. Pt with decreased ability for full functional activities due to pain. She has diffiuclty with supine position today for cervical assesment. She also states throat restriction at times in supine (supported in flexion on pillow). Pt to benefit from skilled PT to improve deficits and pain, to restore normal ROM with decreased pain, improve strength, and improve functional mobility, as well as education on HEP. She also has pain in several other areas including R hip, Bil knees, R ankle, but will focus on above areas at this time.    Personal Factors and Comorbidities Comorbidity 1;Time since onset of injury/illness/exacerbation    Examination-Activity Limitations Bend;Dressing;Lift;Locomotion Level;Reach Overhead    Stability/Clinical Decision Making Evolving/Moderate complexity    Clinical Decision Making Moderate    Rehab Potential Good    PT Frequency 2x / week    PT Duration 6 weeks    PT Treatment/Interventions ADLs/Self Care Home Management;Cryotherapy;Electrical Stimulation;Iontophoresis '4mg'$ /ml Dexamethasone;Moist Heat;Traction;Therapeutic exercise;Therapeutic activities;Functional mobility training;DME Instruction;Neuromuscular re-education;Patient/family education;Manual techniques;Passive range of motion;Dry needling;Spinal Manipulations;Joint Manipulations;Taping;Vestibular;Gait training;Ultrasound    PT Home Exercise Plan [id number]    Consulted and Agree with Plan of Care Patient             Patient will benefit from skilled therapeutic intervention in order to improve the following deficits and impairments:  Hypomobility, Decreased activity  tolerance, Decreased strength, Pain, Increased fascial restricitons, Decreased mobility, Increased muscle spasms, Decreased range of motion, Improper body mechanics, Impaired flexibility  Visit Diagnosis: Cervicalgia  Pain in thoracic spine  Other muscle spasm     Problem List Patient Active Problem List   Diagnosis Date Noted   Osteoporosis    Hypothyroidism    Hyperlipidemia    Diverticulosis    Arthritis    Chronic pain syndrome 02/03/2018   Contusion of left lung 07/20/2017   Cerebral contusion ([place]) 07/17/2017   Traumatic pneumothorax 07/17/2017   Rib fractures 07/16/2017   Vitamin D deficiency 08/06/2015   Osteoporosis without current pathological fracture 08/06/2015    Lyndee Hensen, PT, DPT 12:16 PM  11/26/20    [place], [place], [address] Phone: [phone]   Fax:  [phone]  Name: Samanthagrace Corsiglia MRN: [id number] Date of Birth: Dec 29, 1945

## 2020-11-26 NOTE — Patient Instructions (Signed)
Access Code: [email or web address] URL: [email or web address] Date: 11/26/2020 Prepared by: Lyndee Hensen  Exercises Supine Shoulder Flexion AAROM with [place] - 1 x daily - 1 sets - 10 reps Seated Shoulder Rolls - 2 x daily - 1 sets - 10 reps Seated Scapular Retraction - 1 x daily - 1 sets - 10 reps Seated Cervical Sidebending Stretch - 2 x daily - 3 reps - 30 hold

## 2020-11-28 ENCOUNTER — Telehealth: Payer: Self-pay

## 2020-11-28 NOTE — Telephone Encounter (Signed)
Pt called stating she had a missed a call from [place].

## 2020-11-29 NOTE — Telephone Encounter (Signed)
Spoke to pt told her we did not call her. Pt said it was a misunderstanding she had a missed a call but it was actually a text reminding her of her appt tomorrow. Told pt okay. Have a good day. Pt verbalized understanding.

## 2020-11-30 ENCOUNTER — Other Ambulatory Visit: Payer: Self-pay

## 2020-11-30 ENCOUNTER — Ambulatory Visit (INDEPENDENT_AMBULATORY_CARE_PROVIDER_SITE_OTHER): Payer: Medicare Other

## 2020-11-30 DIAGNOSIS — Z0000 Encounter for general adult medical examination without abnormal findings: Secondary | ICD-10-CM | POA: Diagnosis not present

## 2020-11-30 DIAGNOSIS — Z1231 Encounter for screening mammogram for malignant neoplasm of breast: Secondary | ICD-10-CM

## 2020-11-30 NOTE — Patient Instructions (Signed)
Heather Hodges , Thank you for taking time to come for your Medicare Wellness Visit. I appreciate your ongoing commitment to your health goals. Please review the following plan we discussed and let me know if I can assist you in the future.   Screening recommendations/referrals: Colonoscopy: No longer required  Mammogram: ordered placed 11/30/20 Bone Density: Done 12/29/19 repeat every 2 years  Recommended yearly ophthalmology/optometry visit for glaucoma screening and checkup Recommended yearly dental visit for hygiene and checkup  Vaccinations: Influenza vaccine: Due Pneumococcal vaccine: Completed  Tdap vaccine: Due and discussed Shingles vaccine: Completed 02/16/19 & 04/30/19   Covid-19:Completed 1/18 & 05/02/19  Advanced directives: Please bring a copy of your health care power of attorney and living will to the office at your convenience.  Conditions/risks identified: Keep healthy and be able to be mobile   Next appointment: Follow up in one year for your annual wellness visit    Preventive Care 65 Years and Older, Female Preventive care refers to lifestyle choices and visits with your health care provider that can promote health and wellness. What does preventive care include? A yearly physical exam. This is also called an annual well check. Dental exams once or twice a year. Routine eye exams. Ask your health care provider how often you should have your eyes checked. Personal lifestyle choices, including: Daily care of your teeth and gums. Regular physical activity. Eating a healthy diet. Avoiding tobacco and drug use. Limiting alcohol use. Practicing safe sex. Taking low-dose aspirin every day. Taking vitamin and mineral supplements as recommended by your health care provider. What happens during an annual well check? The services and screenings done by your health care provider during your annual well check will depend on your age, overall health, lifestyle risk factors, and  family history of disease. Counseling  Your health care provider may ask you questions about your: Alcohol use. Tobacco use. Drug use. Emotional well-being. Home and relationship well-being. Sexual activity. Eating habits. History of falls. Memory and ability to understand (cognition). Work and work Statistician. Reproductive health. Screening  You may have the following tests or measurements: Height, weight, and BMI. Blood pressure. Lipid and cholesterol levels. These may be checked every 5 years, or more frequently if you are over 20 years old. Skin check. Lung cancer screening. You may have this screening every year starting at age 30 if you have a 30-pack-year history of smoking and currently smoke or have quit within the past 15 years. Fecal occult blood test (FOBT) of the stool. You may have this test every year starting at age 73. Flexible sigmoidoscopy or colonoscopy. You may have a sigmoidoscopy every 5 years or a colonoscopy every 10 years starting at age 43. Hepatitis C blood test. Hepatitis B blood test. Sexually transmitted disease (STD) testing. Diabetes screening. This is done by checking your blood sugar (glucose) after you have not eaten for a while (fasting). You may have this done every 1-3 years. Bone density scan. This is done to screen for osteoporosis. You may have this done starting at age 54. Mammogram. This may be done every 1-2 years. Talk to your health care provider about how often you should have regular mammograms. Talk with your health care provider about your test results, treatment options, and if necessary, the need for more tests. Vaccines  Your health care provider may recommend certain vaccines, such as: Influenza vaccine. This is recommended every year. Tetanus, diphtheria, and acellular pertussis (Tdap, Td) vaccine. You may need a Td booster  every 10 years. Zoster vaccine. You may need this after age 57. Pneumococcal 13-valent conjugate  (PCV13) vaccine. One dose is recommended after age 80. Pneumococcal polysaccharide (PPSV23) vaccine. One dose is recommended after age 79. Talk to your health care provider about which screenings and vaccines you need and how often you need them. This information is not intended to replace advice given to you by your health care provider. Make sure you discuss any questions you have with your health care provider. Document Released: 04/13/2015 Document Revised: 12/05/2015 Document Reviewed: 01/16/2015 Elsevier Interactive Patient Education  2017 [place] Prevention in the Home Falls can cause injuries. They can happen to people of all ages. There are many things you can do to make your home safe and to help prevent falls. What can I do on the outside of my home? Regularly fix the edges of walkways and driveways and fix any cracks. Remove anything that might make you trip as you walk through a door, such as a raised step or threshold. Trim any bushes or trees on the path to your home. Use bright outdoor lighting. Clear any walking paths of anything that might make someone trip, such as rocks or tools. Regularly check to see if handrails are loose or broken. Make sure that both sides of any steps have handrails. Any raised decks and porches should have guardrails on the edges. Have any leaves, snow, or ice cleared regularly. Use sand or salt on walking paths during winter. Clean up any spills in your garage right away. This includes oil or grease spills. What can I do in the bathroom? Use night lights. Install grab bars by the toilet and in the tub and shower. Do not use towel bars as grab bars. Use non-skid mats or decals in the tub or shower. If you need to sit down in the shower, use a plastic, non-slip stool. Keep the floor dry. Clean up any water that spills on the floor as soon as it happens. Remove soap buildup in the tub or shower regularly. Attach bath mats securely with  double-sided non-slip rug tape. Do not have throw rugs and other things on the floor that can make you trip. What can I do in the bedroom? Use night lights. Make sure that you have a light by your bed that is easy to reach. Do not use any sheets or blankets that are too big for your bed. They should not hang down onto the floor. Have a firm chair that has side arms. You can use this for support while you get dressed. Do not have throw rugs and other things on the floor that can make you trip. What can I do in the kitchen? Clean up any spills right away. Avoid walking on wet floors. Keep items that you use a lot in easy-to-reach places. If you need to reach something above you, use a strong step stool that has a grab bar. Keep electrical cords out of the way. Do not use floor polish or wax that makes floors slippery. If you must use wax, use non-skid floor wax. Do not have throw rugs and other things on the floor that can make you trip. What can I do with my stairs? Do not leave any items on the stairs. Make sure that there are handrails on both sides of the stairs and use them. Fix handrails that are broken or loose. Make sure that handrails are as long as the stairways. Check any carpeting to  make sure that it is firmly attached to the stairs. Fix any carpet that is loose or worn. Avoid having throw rugs at the top or bottom of the stairs. If you do have throw rugs, attach them to the floor with carpet tape. Make sure that you have a light switch at the top of the stairs and the bottom of the stairs. If you do not have them, ask someone to add them for you. What else can I do to help prevent falls? Wear shoes that: Do not have high heels. Have rubber bottoms. Are comfortable and fit you well. Are closed at the toe. Do not wear sandals. If you use a stepladder: Make sure that it is fully opened. Do not climb a closed stepladder. Make sure that both sides of the stepladder are locked  into place. Ask someone to hold it for you, if possible. Clearly mark and make sure that you can see: Any grab bars or handrails. First and last steps. Where the edge of each step is. Use tools that help you move around (mobility aids) if they are needed. These include: Canes. Walkers. Scooters. Crutches. Turn on the lights when you go into a dark area. Replace any light bulbs as soon as they burn out. Set up your furniture so you have a clear path. Avoid moving your furniture around. If any of your floors are uneven, fix them. If there are any pets around you, be aware of where they are. Review your medicines with your doctor. Some medicines can make you feel dizzy. This can increase your chance of falling. Ask your doctor what other things that you can do to help prevent falls. This information is not intended to replace advice given to you by your health care provider. Make sure you discuss any questions you have with your health care provider. Document Released: 01/11/2009 Document Revised: 08/23/2015 Document Reviewed: 04/21/2014 Elsevier Interactive Patient Education  2017 [employer].

## 2020-11-30 NOTE — Addendum Note (Signed)
Addended by: Willette Brace on: 11/30/2020 09:17 AM   Modules accepted: Level of Service, SmartSet

## 2020-11-30 NOTE — Progress Notes (Addendum)
Virtual Visit via Telephone Note  I connected with  Heather Hodges on 11/30/20 at  8:00 AM EDT by telephone and verified that I am speaking with the correct person using two identifiers.  Medicare Annual Wellness visit completed telephonically due to Covid-19 pandemic.   Persons participating in this call: This Health Coach and this patient.   Location: Patient: Home Provider: Office   I discussed the limitations, risks, security and privacy concerns of performing an evaluation and management service by telephone and the availability of in person appointments. The patient expressed understanding and agreed to proceed.  Unable to perform video visit due to video visit attempted and failed and/or patient does not have video capability.   Some vital signs may be absent or patient reported.   Willette Brace, LPN   Subjective:   Heather Hodges is a 75 y.o. female who presents for Medicare Annual (Subsequent) preventive examination.  Review of Systems     Cardiac Risk Factors include: advanced age (>65mn, >>63women);dyslipidemia     Objective:    Today's Vitals   11/30/20 0804  PainSc: 0-No pain   There is no height or weight on file to calculate BMI.  Advanced Directives 11/30/2020 11/26/2020  Does Patient Have a Medical Advance Directive? Yes No  Type of Advance Directive Living will -  Would patient like information on creating a medical advance directive? - No - Patient declined    Current Medications (verified) Outpatient Encounter Medications as of 11/30/2020  Medication Sig   atorvastatin (LIPITOR) 40 MG tablet Take 1 tablet (40 mg total) by mouth daily.   b complex vitamins capsule Take 1 capsule by mouth every other day.   Calcium Carbonate-Vitamin D (CALCIUM 500+D PO) Take 2 each by mouth every other day.   Garlic 1123XX123MG CAPS Take by mouth.   ketoconazole (NIZORAL) 2 % cream Apply to affected area 1-2 times daily   levothyroxine (SYNTHROID) 75 MCG tablet Take 1  tablet (75 mcg total) by mouth every other day.   levothyroxine (SYNTHROID) 88 MCG tablet Take 1 tablet (88 mcg total) by mouth every other day.   lidocaine (XYLOCAINE) 4 % external solution Apply topically.   Lidocaine 4 % PTCH Place onto the skin.   meloxicam (MOBIC) 7.5 MG tablet Take 1 tablet (7.5 mg total) by mouth daily.   Tart Cherry 1200 MG CAPS Take 1 capsule by mouth daily in the afternoon.   traMADol (ULTRAM) 50 MG tablet Take 50 mg by mouth 2 (two) times daily.   gabapentin (NEURONTIN) 100 MG capsule Take 100 mg by mouth 2 (two) times daily.   No facility-administered encounter medications on file as of 11/30/2020.    Allergies (verified) Risedronate sodium   History: Past Medical History:  Diagnosis Date   Arthritis    Colon polyps    Diverticulosis    History of chicken pox    Hyperlipidemia    Hypothyroidism    Osteoporosis    Vitamin D deficiency    Past Surgical History:  Procedure Laterality Date   TONSILLECTOMY AND ADENOIDECTOMY     Family History  Problem Relation Age of Onset   Arthritis Mother    Breast cancer Mother    High Cholesterol Mother    Heart attack Father    High Cholesterol Father    Arthritis Brother    Hearing loss Brother    Hypercholesterolemia Brother    Social History   Socioeconomic History   Marital status: Married  Spouse name: Not on file   Number of children: Not on file   Years of education: Not on file   Highest education level: Not on file  Occupational History   Not on file  Tobacco Use   Smoking status: Never   Smokeless tobacco: Never  Substance and Sexual Activity   Alcohol use: Never   Drug use: Never   Sexual activity: Not on file  Other Topics Concern   Not on file  Social History Narrative   Married   Retired -- Aeronautical engineer and nanny   1 son -- lives in [place] Resource Strain: Low Risk    Difficulty of Paying Living Expenses: Not hard at all   Food Insecurity: No Food Insecurity   Worried About Charity fundraiser in the Last Year: Never true   Arboriculturist in the Last Year: Never true  Transportation Needs: No Transportation Needs   Lack of Transportation (Medical): No   Lack of Transportation (Non-Medical): No  Physical Activity: Insufficiently Active   Days of Exercise per Week: 2 days   Minutes of Exercise per Session: 40 min  Stress: No Stress Concern Present   Feeling of Stress : Only a little  Social Connections: Engineer, building services of Communication with Friends and Family: More than three times a week   Frequency of Social Gatherings with Friends and Family: More than three times a week   Attends Religious Services: More than 4 times per year   Active Member of [employer] or Organizations: Yes   Attends Archivist Meetings: 1 to 4 times per year   Marital Status: Married    Tobacco Counseling Counseling given: Not Answered   Clinical Intake:  Pre-visit preparation completed: Yes  Pain : No/denies pain Pain Score: 0-No pain     BMI - recorded: 25.43 Nutritional Status: BMI 25 -29 Overweight Nutritional Risks: None Diabetes: No  How often do you need to have someone help you when you read instructions, pamphlets, or other written materials from your doctor or pharmacy?: 1 - Never  Diabetic?No  Interpreter Needed?: No  Information entered by :: Charlott Rakes, LPN   Activities of Daily Living In your present state of health, do you have any difficulty performing the following activities: 11/30/2020  Hearing? N  Vision? N  Difficulty concentrating or making decisions? N  Walking or climbing stairs? N  Dressing or bathing? N  Doing errands, shopping? N  Preparing Food and eating ? N  Using the Toilet? N  In the past six months, have you accidently leaked urine? N  Do you have problems with loss of bowel control? N  Managing your Medications? N  Managing your Finances? N   Housekeeping or managing your Housekeeping? N  Some recent data might be hidden    Patient Care Team: Inda Coke, [place] as PCP - General (Physician Assistant) Celene Squibb., MD as Referring Physician (Neurology)  Indicate any recent Medical Services you may have received from other than [facility] providers in the past year (date may be approximate).     Assessment:   This is a routine wellness examination for [place].  Hearing/Vision screen Hearing Screening - Comments:: Pt denies any hearing issues  Vision Screening - Comments:: Pt follows up with Dr weaver for annual eye exams   Dietary issues and exercise activities discussed: Current Exercise Habits: Structured exercise class, Type of exercise: Other -  see comments (PT), Time (Minutes): 40, Frequency (Times/Week): 2, Weekly Exercise (Minutes/Week): 80   Goals Addressed             This Visit's Progress    Patient Stated       Keep healthy and be able to be mobile        Depression Screen PHQ 2/9 Scores 11/30/2020 06/05/2020 07/13/2019  PHQ - 2 Score 0 0 0    Fall Risk Fall Risk  11/30/2020 06/05/2020 07/13/2019  Falls in the past year? 0 0 0  Number falls in past yr: 0 0 -  Injury with Fall? 0 0 -  Risk for fall due to : Impaired vision - -  Follow up Falls prevention discussed Falls evaluation completed Falls evaluation completed    FALL RISK PREVENTION PERTAINING TO THE HOME:  Any stairs in or around the home? No  If so, are there any without handrails? No  Home free of loose throw rugs in walkways, pet beds, electrical cords, etc? Yes  Adequate lighting in your home to reduce risk of falls? Yes   ASSISTIVE DEVICES UTILIZED TO PREVENT FALLS:  Life alert? No  Use of a cane, walker or w/c? No  Grab bars in the bathroom? No  Shower chair or bench in shower? Yes  Elevated toilet seat or a handicapped toilet? No   TIMED UP AND GO:  Was the test performed? No .   Cognitive Function:     6CIT Screen 11/30/2020   What Year? 0 points  What month? 0 points  What time? 0 points  Count back from 20 0 points  Months in reverse 0 points  Repeat phrase 0 points  Total Score 0    Immunizations Immunization History  Administered Date(s) Administered   Fluad Quad(high Dose 65+) 12/23/2019   Influenza, High Dose Seasonal PF 01/01/2015, 03/18/2016, 06/17/2017, 12/18/2017, 12/16/2018   PFIZER(Purple Top)SARS-COV-2 Vaccination 04/17/2018, 05/02/2019   Pneumococcal Conjugate-13 08/07/2015   Pneumococcal Polysaccharide-23 07/02/2009   Zoster Recombinat (Shingrix) 02/16/2019, 04/30/2019    TDAP status: Due, Education has been provided regarding the importance of this vaccine. Advised may receive this vaccine at local pharmacy or Health Dept. Aware to provide a copy of the vaccination record if obtained from local pharmacy or Health Dept. Verbalized acceptance and understanding.  Flu Vaccine status: Due, Education has been provided regarding the importance of this vaccine. Advised may receive this vaccine at local pharmacy or Health Dept. Aware to provide a copy of the vaccination record if obtained from local pharmacy or Health Dept. Verbalized acceptance and understanding.  Pneumococcal vaccine status: Up to date  Covid-19 vaccine status: Completed vaccines  Qualifies for Shingles Vaccine? Yes   Zostavax completed Yes   Shingrix Completed?: Yes  Screening Tests Health Maintenance  Topic Date Due   Hepatitis C Screening  Never done   MAMMOGRAM  02/18/2020   INFLUENZA VACCINE  02/05/2021 (Originally 10/29/2020)   TETANUS/TDAP  06/05/2021 (Originally 06/18/1964)   DEXA SCAN  Completed   PNA vac Low Risk Adult  Completed   Zoster Vaccines- Shingrix  Completed   HPV VACCINES  Aged Out   COVID-19 Vaccine  Discontinued    Health Maintenance  Health Maintenance Due  Topic Date Due   Hepatitis C Screening  Never done   MAMMOGRAM  02/18/2020    Colorectal cancer screening: No longer required.    Mammogram status: Ordered 11/30/20. Pt provided with contact info and advised to call to schedule appt.  Bone Density status: Completed 12/29/19. Results reflect: Bone density results: OSTEOPOROSIS. Repeat every 2 years.   Additional Screening:  Hepatitis C Screening: does qualify;   Vision Screening: Recommended annual ophthalmology exams for early detection of glaucoma and other disorders of the eye. Is the patient up to date with their annual eye exam?  Yes  Who is the provider or what is the name of the office in which the patient attends annual eye exams? Dr Kathlen Mody  If pt is not established with a provider, would they like to be referred to a provider to establish care? No .   Dental Screening: Recommended annual dental exams for proper oral hygiene  Community Resource Referral / Chronic Care Management: CRR required this visit?  No   CCM required this visit?  No      Plan:     I have personally reviewed and noted the following in the patient's chart:   Medical and social history Use of alcohol, tobacco or illicit drugs  Current medications and supplements including opioid prescriptions.  Functional ability and status Nutritional status Physical activity Advanced directives List of other physicians Hospitalizations, surgeries, and ER visits in previous 12 months Vitals Screenings to include cognitive, depression, and falls Referrals and appointments  In addition, I have reviewed and discussed with patient certain preventive protocols, quality metrics, and best practice recommendations. A written personalized care plan for preventive services as well as general preventive health recommendations were provided to patient.     Willette Brace, LPN   [id number]   Nurse Notes: None

## 2020-12-04 NOTE — Progress Notes (Signed)
I, Wendy Poet, LAT, ATC, am serving as scribe for Dr. [name].  Heather Hodges is a 75 y.o. female who presents to [place] at [facility] today for f/u of multiple areas of pain including neck, low back, B ankle and B knee pain.  She was last seen by Dr. Georgina Snell on 11/14/20 and was referred to PT of which she has completed one session.  Today, pt reports no change in her pain and issues. Pt c/o experiencing a "pressure" sensation when she had been laying down during her PT session. Pt notes this is occurring more frequently. Pt has been working on [employer] and is planning to return to PT tomorrow.   Main issues today are neck pain causing a headache and spine pain.  She notes the hip and knee pain and ankle pain previously identified are much minor issue.  Diagnostic imaging: R ankle, R knee, R hip, L-spine and T-spine XR- 11/14/20  Pertinent review of systems: No fevers or chills  Relevant historical information: History of thoracic compression fracture   Exam:  BP 128/82   Pulse 76   Ht 5' (1.524 m)   Wt 130 lb (59 kg)   SpO2 96%   BMI 25.39 kg/m  General: Well Developed, well nourished, and in no acute distress.   MSK: C-spine normal-appearing Nontender midline.  Mildly tender palpation paraspinal musculature.  Decreased cervical motion.    Lab and Radiology Results  X-ray images C-spine obtained today personally and independently interpreted DDD worse at C5-C6.  No acute fractures or significant malalignment. Await formal radiology review  DG Thoracic Spine 2 View  Result Date: 11/14/2020 CLINICAL DATA:  Upper back pain over the past several months, initial encounter EXAM: THORACIC SPINE 2 VIEWS COMPARISON:  06/28/2019 FINDINGS: Compression deformity is again noted at T3 and T6 similar in appearance to that noted on the prior exam. No new compression deformity is seen. No paraspinal mass is noted. Aortic calcifications are seen. The lungs are clear.  IMPRESSION: Chronic T3 and T6 compression deformities. No acute abnormality noted. Electronically Signed   By: Inez Catalina M.D.   On: 11/14/2020 22:04   DG Lumbar Spine 2-3 Views  Result Date: 11/14/2020 CLINICAL DATA:  Chronic low back pain radiating into both hips, initial encounter EXAM: LUMBAR SPINE - 3 VIEW COMPARISON:  None. FINDINGS: Five lumbar type vertebral bodies are well visualized. Vertebral body height is well maintained. Mild degenerative anterolisthesis of L4 on L5 is seen. Mild osteophytic changes are noted. Scattered fecal material is noted throughout the colon without obstructive change. IMPRESSION: Mild degenerative change with anterolisthesis of L4 on L5 as described. Electronically Signed   By: Inez Catalina M.D.   On: 11/14/2020 22:06   DG Ankle Complete Right  Result Date: 11/14/2020 CLINICAL DATA:  Ankle pain EXAM: RIGHT ANKLE - COMPLETE 3+ VIEW COMPARISON:  None. FINDINGS: No fracture or malalignment. Soft tissue swelling. Small plantar calcaneal spur. Ankle mortise is symmetric. IMPRESSION: No acute osseous abnormality Electronically Signed   By: Donavan Foil M.D.   On: 11/14/2020 20:18   DG HIP UNILAT WITH PELVIS 2-3 VIEWS RIGHT  Result Date: 11/14/2020 CLINICAL DATA:  Bilateral hip pain, initial encounter EXAM: DG HIP (WITH OR WITHOUT PELVIS) 3V RIGHT COMPARISON:  None. FINDINGS: Pelvic ring is intact. Minimal degenerative changes of the hip joints are noted. Proximal right femur appears within normal limits. No soft tissue abnormality is noted. IMPRESSION: Mild degenerative change without acute abnormality. Electronically Signed  By: Inez Catalina M.D.   On: 11/14/2020 22:06   DG Knee AP/LAT W/Sunrise Right  Result Date: 11/14/2020 CLINICAL DATA:  Right knee pain, no known injury, initial encounter EXAM: RIGHT KNEE 3 VIEWS COMPARISON:  None. FINDINGS: Degenerative changes are noted in the medial and patellofemoral joint spaces. No joint effusion is seen. No acute  fracture or dislocation is noted. IMPRESSION: Degenerative change without acute abnormality. Electronically Signed   By: Inez Catalina M.D.   On: 11/14/2020 22:07   I, [name], personally (independently) visualized and performed the interpretation of the images attached in this note.    Assessment and Plan: 75 y.o. female with neck pain more chronic.  Neck pain is also associate with a headache and more consistent with occipital neuralgia.  She is already receiving physical therapy for this which should be the first treatment.  Plan to continue physical therapy and home exercises for the C-spine T-spine and L-spine pain.  Recheck back in 6 weeks.  She also notes that she has point pain and tenderness across multiple locations in her body that is concerning for fibromyalgia.  This is certainly a possibility.  Provided some information about fibromyalgia as well as for Cymbalta.  She will do some reading about it and we certainly could start Cymbalta at the next visit or sooner for the visit if needed.  Recheck 6 weeks.   PDMP not reviewed this encounter. Orders Placed This Encounter  Procedures   DG Cervical Spine 2 or 3 views    Standing Status:   Future    Number of Occurrences:   1    Standing Expiration Date:   12/05/2021    Order Specific Question:   Reason for Exam (SYMPTOM  OR DIAGNOSIS REQUIRED)    Answer:   neck pain    Order Specific Question:   Preferred imaging location?    Answer:   [facility]   No orders of the defined types were placed in this encounter.    Discussed warning signs or symptoms. Please see discharge instructions. Patient expresses understanding.   The above documentation has been reviewed and is accurate and complete [name], M.D. Total encounter time 30 minutes including face-to-face time with the patient and, reviewing past medical record, and charting on the date of service.   Treatment plan and options

## 2020-12-05 ENCOUNTER — Ambulatory Visit (INDEPENDENT_AMBULATORY_CARE_PROVIDER_SITE_OTHER): Payer: Medicare Other

## 2020-12-05 ENCOUNTER — Ambulatory Visit (INDEPENDENT_AMBULATORY_CARE_PROVIDER_SITE_OTHER): Payer: Medicare Other | Admitting: Family Medicine

## 2020-12-05 ENCOUNTER — Other Ambulatory Visit: Payer: Self-pay

## 2020-12-05 VITALS — BP 128/82 | HR 76 | Ht 60.0 in | Wt 130.0 lb

## 2020-12-05 DIAGNOSIS — M545 Low back pain, unspecified: Secondary | ICD-10-CM | POA: Diagnosis not present

## 2020-12-05 DIAGNOSIS — G8929 Other chronic pain: Secondary | ICD-10-CM

## 2020-12-05 DIAGNOSIS — M5481 Occipital neuralgia: Secondary | ICD-10-CM | POA: Diagnosis not present

## 2020-12-05 DIAGNOSIS — M542 Cervicalgia: Secondary | ICD-10-CM

## 2020-12-05 DIAGNOSIS — S22000A Wedge compression fracture of unspecified thoracic vertebra, initial encounter for closed fracture: Secondary | ICD-10-CM

## 2020-12-05 IMAGING — DX DG CERVICAL SPINE 2 OR 3 VIEWS
3 series · 3 of 3 positions shown · non-contrast
Comparison: Cervical spine radiograph [DATE]

CLINICAL DATA: Chronic posterior neck pain and stiffness.
Difficulty with range of motion. No known injury.

EXAM:
CERVICAL SPINE - 2-3 VIEW

[c-spine lat]
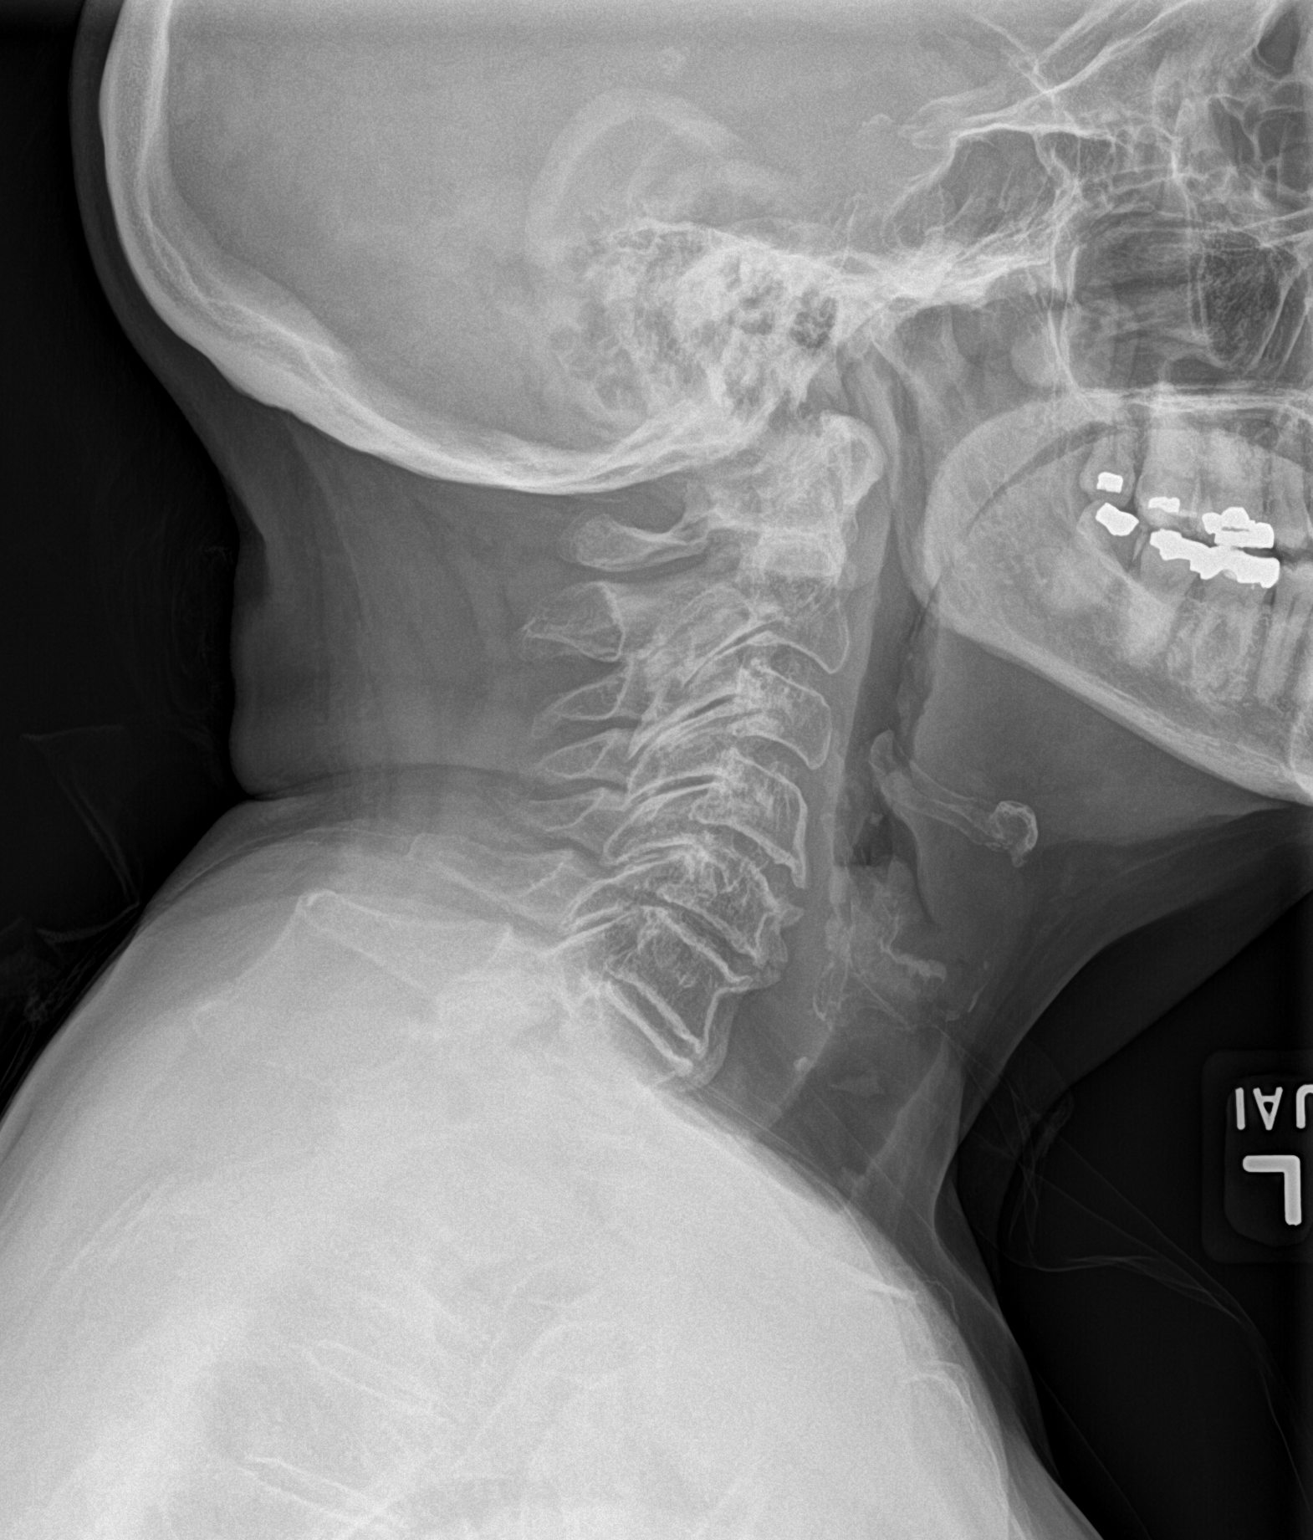

[c-spine ap]
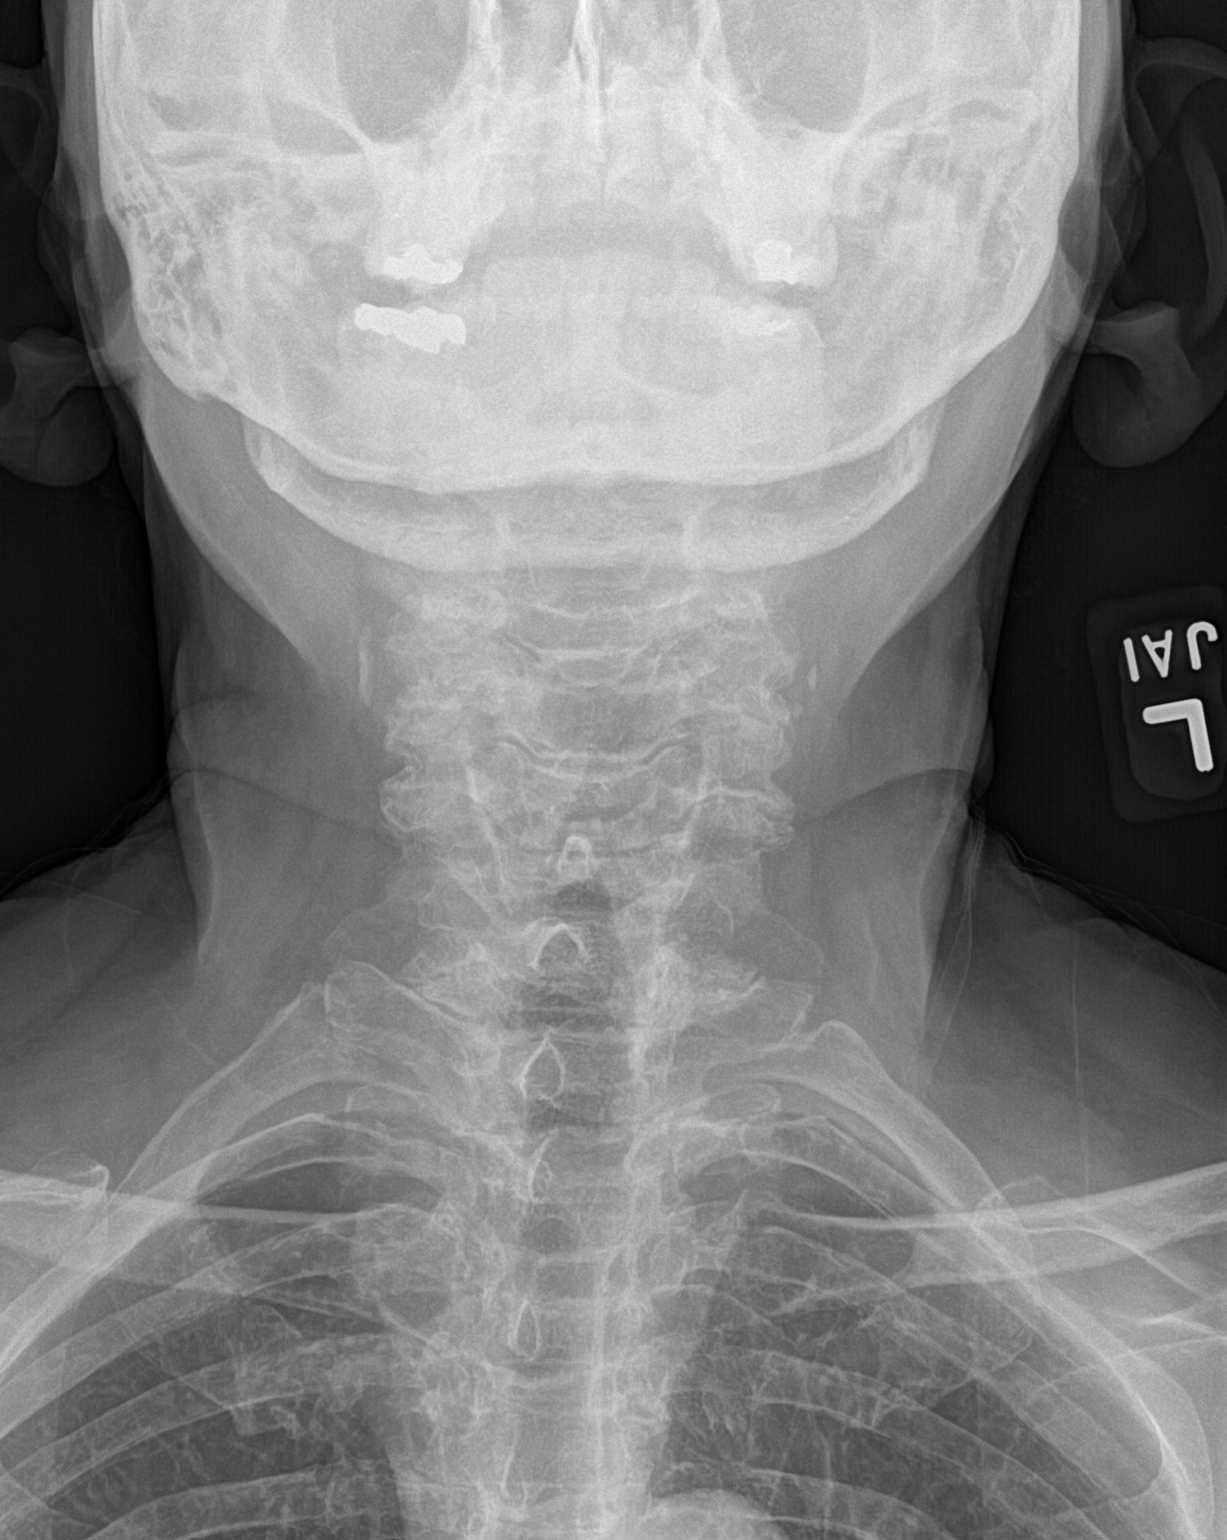

[c-spine open mouth]
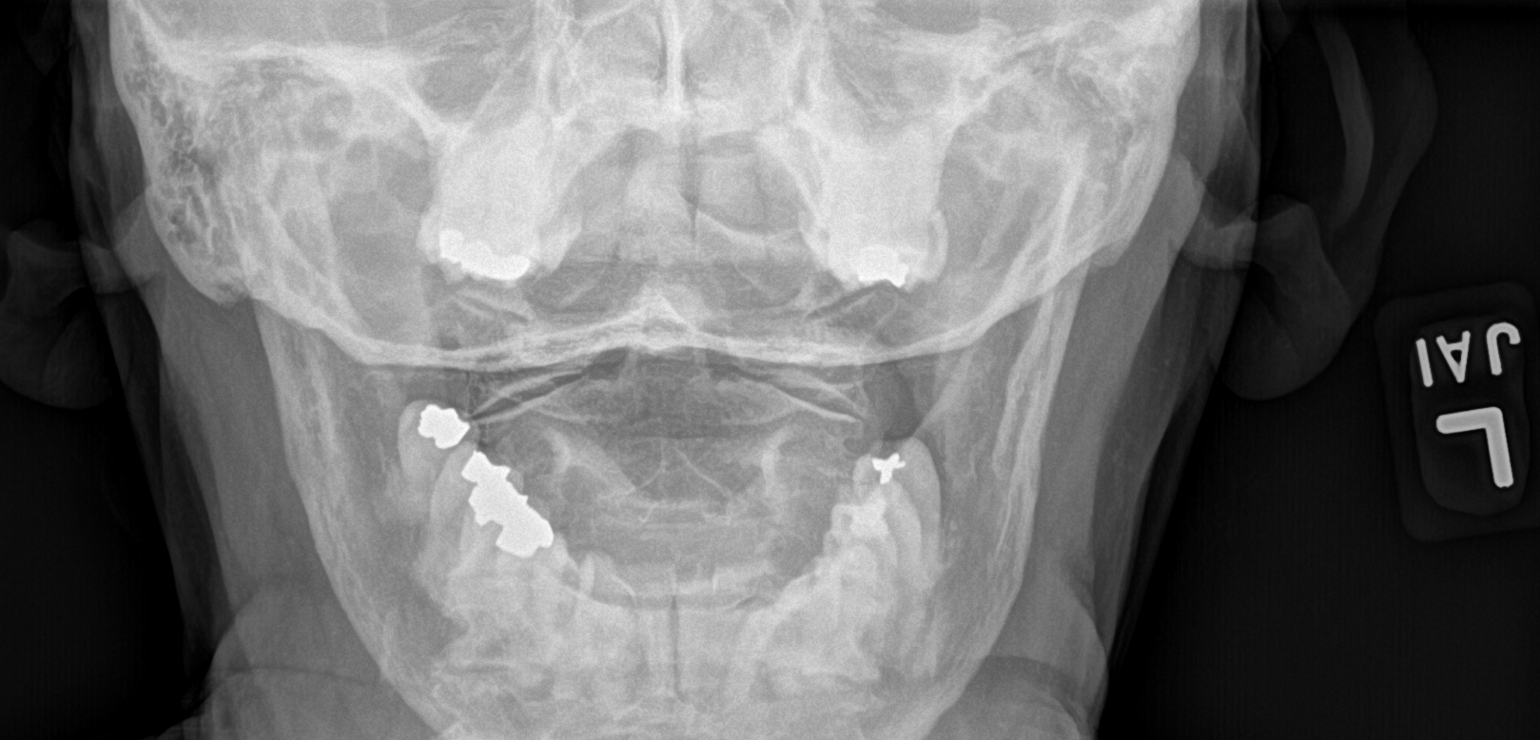

[3 of 3 positions shown; findings below may reference images not displayed]

FINDINGS: Normal alignment. No listhesis. Mild disc space narrowing and
endplate spurring C4-C5 through C6-C7 with anterior spurring.
Prominent multilevel facet hypertrophy. Lateral masses of C1 are
well aligned on C2. there is no evidence of fracture, focal bone
abnormality or bone destruction. No prevertebral soft tissue edema.
IMPRESSION: 1. Mild degenerative disc disease C4-C5 through C6-C7.
2. Prominent multilevel facet hypertrophy.
3. Stable radiographic appearance of degenerative change from [DATE]

## 2020-12-05 NOTE — Patient Instructions (Addendum)
Thank you for coming in today.   Read about cymbata.  It has indications for both regular pain and for fibromyalgia. It also is usually used for anxiety and depression.   Fish oil or Krill oil will help some.   CBD can help a little. It may be sedating.   Let me know if you want me to prescribe cymbalta.   Please get an Xray today before you leave   Return in about 6 weeks.   Duloxetine Delayed-Release Capsules What is this medication? DULOXETINE (doo LOX e teen) treats depression, anxiety, fibromyalgia, and certain types of chronic pain such as nerve, bone, or joint pain. It increases the amount of serotonin and norepinephrine in the brain, hormones that help regulate mood and pain. It belongs to a group of medications called SNRIs. This medicine may be used for other purposes; ask your health care provider or pharmacist if you have questions. COMMON BRAND NAME(S): Cymbalta, Drizalma, Irenka What should I tell my care team before I take this medication? They need to know if you have any of these conditions: Bipolar disorder Glaucoma High blood pressure Kidney disease Liver disease Seizures Suicidal thoughts, plans or attempt; a previous suicide attempt by you or a family member Take medications that treat or prevent blood clots Taken medications called MAOIs like Carbex, Eldepryl, Marplan, Nardil, and Parnate within 14 days Trouble passing urine An unusual reaction to duloxetine, other medications, foods, dyes, or preservatives Pregnant or trying to get pregnant Breast-feeding How should I use this medication? Take this medication by mouth with a glass of water. Follow the directions on the prescription label. Do not crush, cut or chew some capsules of this medication. Some capsules may be opened and sprinkled on applesauce. Check with your care team or pharmacist if you are not sure. You can take this medication with or without food. Take your medication at regular intervals. Do  not take your medication more often than directed. Do not stop taking this medication suddenly except upon the advice of your care team. Stopping this medication too quickly may cause serious side effects or your condition may worsen. A special MedGuide will be given to you by the pharmacist with each prescription and refill. Be sure to read this information carefully each time. Talk to your care team regarding the use of this medication in children. While this medication may be prescribed for children as young as 38 years of age for selected conditions, precautions do apply. Overdosage: If you think you have taken too much of this medicine contact a poison control center or emergency room at once. NOTE: This medicine is only for you. Do not share this medicine with others. What if I miss a dose? If you miss a dose, take it as soon as you can. If it is almost time for your next dose, take only that dose. Do not take double or extra doses. What may interact with this medication? Do not take this medication with any of the following: Desvenlafaxine Levomilnacipran Linezolid MAOIs like Carbex, Eldepryl, [place], Marplan, Nardil, and Parnate Methylene blue (injected into a vein) Milnacipran Safinamide Thioridazine Venlafaxine Viloxazine This medication may also interact with the following: Alcohol Amphetamines Aspirin and aspirin-like medications Certain antibiotics like ciprofloxacin and enoxacin Certain medications for blood pressure, heart disease, irregular heart beat Certain medications for depression, anxiety, or psychotic disturbances Certain medications for migraine headache like almotriptan, eletriptan, frovatriptan, naratriptan, rizatriptan, sumatriptan, zolmitriptan Certain medications that treat or prevent blood clots like warfarin, enoxaparin,  and dalteparin Cimetidine Fentanyl Lithium NSAIDS, medications for pain and inflammation, like ibuprofen or  naproxen Phentermine Procarbazine Rasagiline Sibutramine St. John's wort Theophylline Tramadol Tryptophan This list may not describe all possible interactions. Give your health care provider a list of all the medicines, herbs, non-prescription drugs, or dietary supplements you use. Also tell them if you smoke, drink alcohol, or use illegal drugs. Some items may interact with your medicine. What should I watch for while using this medication? Tell your care team if your symptoms do not get better or if they get worse. Visit your care team for regular checks on your progress. Because it may take several weeks to see the full effects of this medication, it is important to continue your treatment as prescribed by your care team. This medication may cause serious skin reactions. They can happen weeks to months after starting the medication. Contact your care team right away if you notice fevers or flu-like symptoms with a rash. The rash may be red or purple and then turn into blisters or peeling of the skin. Or, you might notice a red rash with swelling of the face, lips, or lymph nodes in your neck or under your arms. Watch for new or worsening thoughts of suicide or depression. This includes sudden changes in mood, behaviors, or thoughts. These changes can happen at any time but are more common in the beginning of treatment or after a change in dose. Call your care team right away if you experience these thoughts or worsening depression. Manic episodes may happen in patients with bipolar disorder who take this medication. Watch for changes in feelings or behaviors such as feeling anxious, nervous, agitated, panicky, irritable, hostile, aggressive, impulsive, severely restless, overly excited and hyperactive, or trouble sleeping. These symptoms can happen at any time, but are more common in the beginning of treatment or after a change in dose. Call your care team right away if you notice any of these  symptoms. You may get drowsy or dizzy. Do not drive, use machinery, or do anything that needs mental alertness until you know how this medication affects you. Do not stand or sit up quickly, especially if you are an older patient. This reduces the risk of dizzy or fainting spells. Alcohol may interfere with the effect of this medication. Avoid alcoholic drinks. This medication may increase blood sugar. The risk may be higher in patients who already have diabetes. Ask your care team what you can do to lower your risk of diabetes while taking this medication. This medication can cause an increase in blood pressure. This medication can also cause a sudden drop in your blood pressure, which may make you feel faint and increase the chance of a fall. These effects are most common when you first start the medication or when the dose is increased, or during use of other medications that can cause a sudden drop in blood pressure. Check with your care team for instructions on monitoring your blood pressure while taking this medication. Your mouth may get dry. Chewing sugarless gum or sucking hard candy, and drinking plenty of water, may help. Contact your care team if the problem does not go away or is severe. What side effects may I notice from receiving this medication? Side effects that you should report to your care team as soon as possible: Allergic reactions-skin rash, itching, hives, swelling of the face, lips, tongue, or throat Bleeding-bloody or black, tar-like stools, red or dark brown urine, vomiting blood or brown  material that looks like coffee grounds, small, red or purple spots on skin, unusual bleeding or bruising Increase in blood pressure Liver injury-right upper belly pain, loss of appetite, nausea, light-colored stool, dark yellow or brown urine, yellowing skin or eyes, unusual weakness or fatigue Low sodium level-muscle weakness, fatigue, dizziness, headache, confusion Redness, blistering,  peeling, or loosening of the skin, including inside the mouth Serotonin syndrome-irritability, confusion, fast or irregular heartbeat, muscle stiffness, twitching muscles, sweating, high fever, seizures, chills, vomiting, diarrhea Sudden eye pain or change in vision such as blurry vision, seeing halos around lights, vision loss Thoughts of suicide or self-harm, worsening mood, feelings of depression Trouble passing urine Side effects that usually do not require medical attention (report to your care team if they continue or are bothersome): Change in sex drive or performance Constipation Diarrhea Dizziness Dry mouth Excessive sweating Loss of appetite Nausea Vomiting This list may not describe all possible side effects. Call your doctor for medical advice about side effects. You may report side effects to FDA at 1-800-FDA-1088. Where should I keep my medication? Keep out of the reach of children and pets. Store at room temperature between 15 and 30 degrees C (59 to 86 degrees F). Get rid of any unused medication after the expiration date. To get rid of medications that are no longer needed or have expired: Take the medication to a medication take-back program. Check with your pharmacy or law enforcement to find a location. If you cannot return the medication, check the label or package insert to see if the medication should be thrown out in the garbage or flushed down the toilet. If you are not sure, ask your care team. If it is safe to put it in the trash, take the medication out of the container. Mix the medication with cat litter, dirt, coffee grounds, or other unwanted substance. Seal the mixture in a bag or container. Put it in the trash. NOTE: This sheet is a summary. It may not cover all possible information. If you have questions about this medicine, talk to your doctor, pharmacist, or health care provider.  2022 Elsevier/Gold Standard (2020-03-26 12:32:25)   Myofascial Pain  Syndrome and Fibromyalgia Myofascial pain syndrome and fibromyalgia are both pain disorders. This pain may be felt mainly in your muscles. Myofascial pain syndrome: Always has tender points in the muscle that will cause pain when pressed (trigger points). The pain may come and go. Usually affects your neck, upper back, and shoulder areas. The pain often radiates into your arms and hands. Fibromyalgia: Has muscle pains and tenderness that come and go. Is often associated with fatigue and sleep problems. Has trigger points. Tends to be long-lasting (chronic), but is not life-threatening. Fibromyalgia and myofascial pain syndrome are not the same. However, they often occur together. If you have both conditions, each can make the other worse. Both are common and can cause enough pain and fatigue to make day-to-day activities difficult. Both can be hard to diagnose because their symptoms are common in many other conditions. What are the causes? The exact causes of these conditions are not known. What increases the risk? You are more likely to develop this condition if: You have a family history of the condition. You have certain triggers, such as: Spine disorders. An injury (trauma) or other physical stressors. Being under a lot of stress. Medical conditions such as osteoarthritis, rheumatoid arthritis, or lupus. What are the signs or symptoms? Fibromyalgia The main symptom of fibromyalgia is widespread pain and  tenderness in your muscles. Pain is sometimes described as stabbing, shooting, or burning. You may also have: Tingling or numbness. Sleep problems and fatigue. Problems with attention and concentration (fibro fog). Other symptoms may include:  Bowel and bladder problems. Headaches. Visual problems. Problems with odors and noises. Depression or mood changes. Painful menstrual periods (dysmenorrhea). Dry skin or eyes. These symptoms can vary over time. Myofascial pain  syndrome Symptoms of myofascial pain syndrome include: Tight, ropy bands of muscle. Uncomfortable sensations in muscle areas. These may include aching, cramping, burning, numbness, tingling, and weakness. Difficulty moving certain parts of the body freely (poor range of motion). How is this diagnosed? This condition may be diagnosed by your symptoms and medical history. You will also have a physical exam. In general: Fibromyalgia is diagnosed if you have pain, fatigue, and other symptoms for more than 3 months, and symptoms cannot be explained by another condition. Myofascial pain syndrome is diagnosed if you have trigger points in your muscles, and those trigger points are tender and cause pain elsewhere in your body (referred pain). How is this treated? Treatment for these conditions depends on the type that you have. For fibromyalgia: Pain medicines, such as NSAIDs. Medicines for treating depression. Medicines for treating seizures. Medicines that relax the muscles. For myofascial pain: Pain medicines, such as NSAIDs. Cooling and stretching of muscles. Trigger point injections. Sound wave (ultrasound) treatments to stimulate muscles. Treating these conditions often requires a team of health care providers. These may include: Your primary care provider. Physical therapist. Complementary health care providers, such as massage therapists or acupuncturists. Psychiatrist for cognitive behavioral therapy. Follow these instructions at home: Medicines Take over-the-counter and prescription medicines only as told by your health care provider. Do not drive or use heavy machinery while taking prescription pain medicine. If you are taking prescription pain medicine, take actions to prevent or treat constipation. Your health care provider may recommend that you: Drink enough fluid to keep your urine pale yellow. Eat foods that are high in fiber, such as fresh fruits and vegetables, whole  grains, and beans. Limit foods that are high in fat and processed sugars, such as fried or sweet foods. Take an over-the-counter or prescription medicine for constipation. Lifestyle  Exercise as directed by your health care provider or physical therapist. Practice relaxation techniques to control your stress. You may want to try: Biofeedback. Visual imagery. Hypnosis. Muscle relaxation. Yoga. Meditation. Maintain a healthy lifestyle. This includes eating a healthy diet and getting enough sleep. Do not use any products that contain nicotine or tobacco, such as cigarettes and e-cigarettes. If you need help quitting, ask your health care provider. General instructions Talk to your health care provider about complementary treatments, such as acupuncture or massage. Consider joining a support group with others who are diagnosed with this condition. Do not do activities that stress or strain your muscles. This includes repetitive motions and heavy lifting. Keep all follow-up visits as told by your health care provider. This is important. Where to find more information National Fibromyalgia Association: www.fmaware.[place]: www.arthritis.org American Chronic Pain Association: www.theacpa.org Contact a health care provider if: You have new symptoms. Your symptoms get worse or your pain is severe. You have side effects from your medicines. You have trouble sleeping. Your condition is causing depression or anxiety. Summary Myofascial pain syndrome and fibromyalgia are pain disorders. Myofascial pain syndrome has tender points in the muscle that will cause pain when pressed (trigger points). Fibromyalgia also has muscle pains and tenderness  that come and go, but this condition is often associated with fatigue and sleep disturbances. Fibromyalgia and myofascial pain syndrome are not the same but often occur together, causing pain and fatigue that make day-to-day activities  difficult. Treatment for fibromyalgia includes taking medicines to relax the muscles and medicines for pain, depression, or seizures. Treatment for myofascial pain syndrome includes taking medicines for pain, cooling and stretching of muscles, and injecting medicines into trigger points. Follow your health care provider's instructions for taking medicines and maintaining a healthy lifestyle. This information is not intended to replace advice given to you by your health care provider. Make sure you discuss any questions you have with your health care provider. Document Revised: 07/09/2018 Document Reviewed: 04/01/2017 Elsevier Patient Education  2022 [employer].

## 2020-12-06 ENCOUNTER — Encounter: Payer: Self-pay | Admitting: Physical Therapy

## 2020-12-06 ENCOUNTER — Ambulatory Visit (INDEPENDENT_AMBULATORY_CARE_PROVIDER_SITE_OTHER): Payer: Medicare Other | Admitting: Physical Therapy

## 2020-12-06 DIAGNOSIS — M542 Cervicalgia: Secondary | ICD-10-CM | POA: Diagnosis not present

## 2020-12-06 DIAGNOSIS — M62838 Other muscle spasm: Secondary | ICD-10-CM | POA: Diagnosis not present

## 2020-12-06 DIAGNOSIS — M546 Pain in thoracic spine: Secondary | ICD-10-CM

## 2020-12-06 NOTE — Progress Notes (Signed)
X-ray cervical spine shows prominent multilevel facet arthritis changes.  Additionally disc degenerative changes are present in cervical spine. X-rays look pretty stable compared to last x-ray in March 2021.

## 2020-12-06 NOTE — Therapy (Signed)
[facility] [address] [place], [place], [address] Phone: [phone]   Fax:  [phone]  Physical Therapy Treatment  Patient Details  Name: Heather Hodges MRN: [id number] Date of Birth: Aug 21, 1945 Referring Provider (PT): [name]   Encounter Date: 12/06/2020   PT End of Session - 12/06/20 1201     Visit Number 2    Number of Visits 12    Date for PT Re-Evaluation 01/07/21    Authorization Type Medicare    PT Start Time 1105    PT Stop Time 1153    PT Time Calculation (min) 48 min    Activity Tolerance Patient tolerated treatment well;Patient limited by pain    Behavior During Therapy [facility] for tasks assessed/performed             Past Medical History:  Diagnosis Date   Arthritis    Colon polyps    Diverticulosis    History of chicken pox    Hyperlipidemia    Hypothyroidism    Osteoporosis    Vitamin D deficiency     Past Surgical History:  Procedure Laterality Date   TONSILLECTOMY AND ADENOIDECTOMY      There were no vitals filed for this visit.   Subjective Assessment - 12/06/20 1200     Subjective Pt has been doing HEP. States tension and pain in neck and upper back.    Currently in Pain? Yes    Pain Score 5     Pain Location Neck    Pain Orientation Left;Right    Pain Descriptors / Indicators Aching;Tightness    Pain Type Chronic pain    Pain Onset More than a month ago    Pain Frequency Intermittent                               [facility] Adult PT Treatment/Exercise - 12/06/20 0001       Exercises   Exercises Neck      Neck Exercises: Seated   Cervical Rotation 10 reps    Shoulder Rolls 10 reps    Other Seated Exercise scap retract x 10;      Neck Exercises: Supine   Other Supine Exercise shoulder flexion with cane x 10;      Neck Exercises: Stretches   Upper Trapezius Stretch 2 reps;20 seconds    Upper Trapezius Stretch Limitations unable to use had to rest on head  when stretching R side, due to pain on L.      Manual Therapy   Manual Therapy Soft tissue mobilization;Passive ROM;Manual Traction    Soft tissue mobilization STM/TPR to bil upper traps, cervical paraspinals, Sub occcipitals, into bil rhomboids and levator. Very very light manual traction for sub occipital stretch 5 sec x 8 ;    Passive ROM manual stretching for UTs.                       PT Short Term Goals - 11/26/20 1211       PT SHORT TERM GOAL #1   Title Pt to be independent with initial HEP    Time 2    Period Weeks    Status New    Target Date 12/10/20               PT Long Term Goals - 11/26/20 1213       PT LONG TERM GOAL #1   Title  Pt to be independent with final HEP    Time 6    Period Weeks    Status New    Target Date 01/07/21      PT LONG TERM GOAL #2   Title Pt to report decreased soreness and tension in neck, to 0-2/10 with movment. 6    Time 6    Period Weeks    Status New    Target Date 01/07/21      PT LONG TERM GOAL #3   Title Pt to demo improved cervical rotation to at least 45 deg bilaterally, to improve ability for head turns and IADLS.    Time 6    Period Weeks    Status New    Target Date 01/07/21      PT LONG TERM GOAL #4   Title Pt to demo improved soft tissue restrictions in cervical and thoracic region to be [facility] for decreased pain.    Time 6    Period Weeks    Status New    Target Date 01/07/21      PT LONG TERM GOAL #5   Title Pt to demo ability for shoulder elevation and strengthening without pain in thoracic spine , to improve ability for IADLs.    Time 6    Period Weeks    Status New    Target Date 01/07/21                   Plan - 12/06/20 1203     Clinical Impression Statement Reviewed HEP, pt req cuing to perform correctly. Pt with signfiicant tension and guarding with STM today. Does have mild improvment with ability for cervical rotation. Pt tolerated manual well, with cues to relax  throughout. She did have increased discomfort at end of session, when bringing her neck into a more retracted, mild extension position. She had mild nausea, pain into head, and mild "dizziness". Symptoms did subside after sitting then laying on side for about 3-5 minutes. Pt with no symptoms upon leaving clinic and agrees that she is ok to drive homed. Some concern for these symptoms, pt is aware of this, has been doing on for a long time she states, she has been unable to look up or lay flate because of these symptoms. I will mention to her MD.    Personal Factors and Comorbidities Comorbidity 1;Time since onset of injury/illness/exacerbation    Examination-Activity Limitations Bend;Dressing;Lift;Locomotion Level;Reach Overhead    Stability/Clinical Decision Making Evolving/Moderate complexity    Rehab Potential Good    PT Frequency 2x / week    PT Duration 6 weeks    PT Treatment/Interventions ADLs/Self Care Home Management;Cryotherapy;Electrical Stimulation;Iontophoresis '4mg'$ /ml Dexamethasone;Moist Heat;Traction;Therapeutic exercise;Therapeutic activities;Functional mobility training;DME Instruction;Neuromuscular re-education;Patient/family education;Manual techniques;Passive range of motion;Dry needling;Spinal Manipulations;Joint Manipulations;Taping;Vestibular;Gait training;Ultrasound    PT Home Exercise Plan [id number]    Consulted and Agree with Plan of Care Patient             Patient will benefit from skilled therapeutic intervention in order to improve the following deficits and impairments:  Hypomobility, Decreased activity tolerance, Decreased strength, Pain, Increased fascial restricitons, Decreased mobility, Increased muscle spasms, Decreased range of motion, Improper body mechanics, Impaired flexibility  Visit Diagnosis: Cervicalgia  Pain in thoracic spine  Other muscle spasm     Problem List Patient Active Problem List   Diagnosis Date Noted   Osteoporosis     Hypothyroidism    Hyperlipidemia    Diverticulosis    Arthritis  Chronic pain syndrome 02/03/2018   Contusion of left lung 07/20/2017   Cerebral contusion ([place]) 07/17/2017   Traumatic pneumothorax 07/17/2017   Rib fractures 07/16/2017   Vitamin D deficiency 08/06/2015   Osteoporosis without current pathological fracture 08/06/2015   Lyndee Hensen, PT, DPT 12:11 PM  12/06/20    [facility] [place], [place], [address] Phone: [phone]   Fax:  [phone]  Name: Heather Hodges MRN: [id number] Date of Birth: May 01, 1945

## 2020-12-06 NOTE — Addendum Note (Signed)
Addended by: Willette Brace on: 12/06/2020 11:45 AM   Modules accepted: Orders, SmartSet

## 2020-12-10 ENCOUNTER — Telehealth: Payer: Self-pay

## 2020-12-10 ENCOUNTER — Telehealth: Payer: Self-pay | Admitting: Physical Therapy

## 2020-12-10 ENCOUNTER — Ambulatory Visit (INDEPENDENT_AMBULATORY_CARE_PROVIDER_SITE_OTHER): Payer: Medicare Other | Admitting: Sports Medicine

## 2020-12-10 ENCOUNTER — Other Ambulatory Visit: Payer: Self-pay

## 2020-12-10 VITALS — BP 132/82 | HR 82 | Ht 60.0 in | Wt 131.0 lb

## 2020-12-10 DIAGNOSIS — I951 Orthostatic hypotension: Secondary | ICD-10-CM | POA: Diagnosis not present

## 2020-12-10 DIAGNOSIS — R42 Dizziness and giddiness: Secondary | ICD-10-CM

## 2020-12-10 DIAGNOSIS — M542 Cervicalgia: Secondary | ICD-10-CM | POA: Diagnosis not present

## 2020-12-10 DIAGNOSIS — G8929 Other chronic pain: Secondary | ICD-10-CM

## 2020-12-10 NOTE — Telephone Encounter (Signed)
Returned pt's call and informed her of Dr. Clovis Riley advice and added option of a muscle relaxer.  Advise her that if she feels like she needs to be seen today, that we can schedule her w/ Dr. Glennon Mac.  Pt states that she has called Lauren from PT and wants to wait until she hears back from [facility] before deciding if she wants a muscle relaxer or if she wants to be seen.

## 2020-12-10 NOTE — Telephone Encounter (Signed)
Please schedule with pt. If symptoms progress, please have her follow up in Urgent Care.  Thanks.

## 2020-12-10 NOTE — Telephone Encounter (Signed)
I think you will be ok. But if you need to be seen today Dr Glennon Mac could work you in today. I could see you tomorrow I think.   Please let me know if you are worsening or not improving.

## 2020-12-10 NOTE — Telephone Encounter (Signed)
Called pt to get schedule. She is at [facility] to get her head checked out.

## 2020-12-10 NOTE — Patient Instructions (Addendum)
Good to see you  Continue PT, but only passive technic, No massaging tech as it worsened dizziness Please contact PCP and schedule an appointment for med reconciliation because you had orthostatic BP that dropped and could be a cause of the dizziness.  Follow up with me 2 weeks after you see PCP

## 2020-12-10 NOTE — Telephone Encounter (Signed)
Pt called to inform us that she has been experiencing pain/discomfort from her post neck up to the back of her head w/ some intermittent nausea and dizziness associated w/ her PT appt on 12/06/20.  She notes that this area is particularly uncomfortable when laying supine and can't find a comfortable position when laying down at night.  I ask if she's contacted her PT since her appt to let her know that she con't to have these symptoms and the pt says that she hasn't.  I advise the pt to contact her PT and let her know that she's continuing to have pain after her last session.  I also advise pt that I will pass this info on to Dr. Georgina Snell for additional advice/guidance and will contact her back w/ his response.

## 2020-12-10 NOTE — Progress Notes (Signed)
Benito Mccreedy D.[facility] Phone: [phone]   Assessment and Plan:    1. Chronic neck pain 2. Orthostatic hypotension 3. Dizziness and giddiness -Chronic neck pain with acute dizziness and nausea without vomiting, subsequent visit - Currently unclear and likely mixed etiology for patient's presenting symptoms - Patient's dizziness and concurrent nausea seem to be triggered by physical palpation of patient's neck.  Because of this, we will attempt to continue physical therapy focusing on passive techniques and avoidance of massage therapies. - Patient was found to have orthostatic hypotension with a 20 point systolic drop in blood pressure from sitting to standing.  Patient has had no recent medication changes, however chronic use of tramadol may be aggravating this and contributing to her new dizziness symptoms.  I recommend the patient follow-up with primary care physician to discuss medication reconciliation and seeing if patient can wean off of potential contributing medications. - Discussed trialing Cymbalta due to patient's multiple areas of pain, however with new onset dizziness, we will investigate the symptoms first before starting the medication    Pertinent previous records reviewed include PCP note, sports medicine note   Follow Up: 2 weeks after visit with PCP.  At that point patient should have continued physical therapy as well as possibly made medication changes with PCP.  Can discuss Cymbalta versus advanced imaging at that time   Subjective:   I, Judy Pimple, am serving as a scribe for Dr. Glennon Mac  Chief Complaint: neck pain   HPI: 75 year old female presenting with neck pain   12/10/20 Patient states experiencing pain/discomfort from her post neck up to the back of her head w/ some intermittent nausea and dizziness associated w/ her PT appt on 12/06/20. Pain is worse when laying  down and cannot find a comfortable position at night to be able to sleep. Patient is unable to lay her head on any surface without this "static" like pain in her head.   Relevant Historical Information: 2019 fell off a ladder and had a slight brain bleed.   Additional pertinent review of systems negative.   Current Outpatient Medications:    atorvastatin (LIPITOR) 40 MG tablet, Take 1 tablet (40 mg total) by mouth daily., Disp: 90 tablet, Rfl: 3   b complex vitamins capsule, Take 1 capsule by mouth every other day., Disp: , Rfl:    Calcium Carbonate-Vitamin D (CALCIUM 500+D PO), Take 2 each by mouth every other day., Disp: , Rfl:    Garlic 123XX123 MG CAPS, Take by mouth., Disp: , Rfl:    ketoconazole (NIZORAL) 2 % cream, Apply to affected area 1-2 times daily, Disp: 15 g, Rfl: 0   levothyroxine (SYNTHROID) 75 MCG tablet, Take 1 tablet (75 mcg total) by mouth every other day., Disp: 90 tablet, Rfl: 1   levothyroxine (SYNTHROID) 88 MCG tablet, Take 1 tablet (88 mcg total) by mouth every other day., Disp: 90 tablet, Rfl: 1   lidocaine (XYLOCAINE) 4 % external solution, Apply topically., Disp: , Rfl:    Lidocaine 4 % PTCH, Place onto the skin., Disp: , Rfl:    meloxicam (MOBIC) 7.5 MG tablet, Take 1 tablet (7.5 mg total) by mouth daily., Disp: 90 tablet, Rfl: 3   Tart Cherry 1200 MG CAPS, Take 1 capsule by mouth daily in the afternoon., Disp: , Rfl:    traMADol (ULTRAM) 50 MG tablet, Take 50 mg by mouth 2 (two) times daily., Disp: ,  Rfl:    gabapentin (NEURONTIN) 100 MG capsule, Take 100 mg by mouth 2 (two) times daily., Disp: , Rfl:    Objective:     Vitals:   12/10/20 1451  BP: 132/82  Pulse: 82  SpO2: 96%  Weight: 131 lb (59.4 kg)  Height: 5' (1.524 m)    Orthostatic vitals:  Supine 130/70 Standing 110/70  Body mass index is 25.58 kg/m.    Physical Exam:    Cervical Spine: Posture normal; No Tenderness to palpation  Neck ROM: Rotation limited to 30 degrees left and right,  moderate reduction in sidebending bilaterally.  No pain with passive ROM or reproduction of dizziness symptoms  Neurological:  Deep Tendon Reflexes:   Right  Left   Braachioradialis 2+ 2+  Bicep 2+  2+    Strength   Right  Left   Bicep 5/5  5/5  Tricep 5/5 5/5  Grip 5/5 5/5  Deltoid 5/5 5/5   Sensation: intact to light touch Skin: normal, intact Spurling's:  negative bilaterally ROM: Full active ROM TTP: Cervical paraspinal, trapezius, thoracic paraspinal NTTP: Cervical spinous processes   Electronically signed by:  Benito Mccreedy D.[facility] 4:55 PM 12/10/20

## 2020-12-10 NOTE — Telephone Encounter (Signed)
Pt called in stating that she had physical therapy last week and she is now unable to sleep and lay down on her head. Rashetta stated that Lauren had her do a something in physical therapy and she got nauseous. Jaeleigh stated that Lauren sat her down until she was able to stand back up. She stated that since then she has not been able to sleep and she cannot lay down on the back of her head like normal. Should we schedule an appt? Please Advise.

## 2020-12-11 NOTE — Telephone Encounter (Signed)
Pt is scheduled for tomorrow with Aldona Bar.

## 2020-12-12 ENCOUNTER — Encounter: Payer: Self-pay | Admitting: Physician Assistant

## 2020-12-12 ENCOUNTER — Other Ambulatory Visit: Payer: Self-pay

## 2020-12-12 ENCOUNTER — Ambulatory Visit (INDEPENDENT_AMBULATORY_CARE_PROVIDER_SITE_OTHER): Payer: Medicare Other | Admitting: Physician Assistant

## 2020-12-12 ENCOUNTER — Ambulatory Visit (HOSPITAL_COMMUNITY)
Admission: RE | Admit: 2020-12-12 | Discharge: 2020-12-12 | Disposition: A | Discharge status: Home / Self Care | Payer: Medicare Other | Source: Ambulatory Visit | Attending: Physician Assistant | Admitting: Physician Assistant

## 2020-12-12 ENCOUNTER — Ambulatory Visit (HOSPITAL_COMMUNITY): Payer: Medicare Other

## 2020-12-12 VITALS — BP 120/70 | HR 77 | Temp 97.8°F | Ht 60.0 in | Wt 129.4 lb

## 2020-12-12 DIAGNOSIS — H53453 Other localized visual field defect, bilateral: Secondary | ICD-10-CM | POA: Insufficient documentation

## 2020-12-12 DIAGNOSIS — E785 Hyperlipidemia, unspecified: Secondary | ICD-10-CM

## 2020-12-12 DIAGNOSIS — R42 Dizziness and giddiness: Secondary | ICD-10-CM | POA: Diagnosis not present

## 2020-12-12 LAB — POC URINALSYSI DIPSTICK (AUTOMATED)
Bilirubin, UA: NEGATIVE
Blood, UA: NEGATIVE
Glucose, UA: NEGATIVE
Ketones, UA: NEGATIVE
Leukocytes, UA: NEGATIVE
Nitrite, UA: NEGATIVE
Protein, UA: NEGATIVE
Spec Grav, UA: 1.015 (ref 1.010–1.025)
Urobilinogen, UA: 0.2 E.U./dL
pH, UA: 7.5 (ref 5.0–8.0)

## 2020-12-12 LAB — CBC WITH DIFFERENTIAL/PLATELET
Basophils Absolute: 0 10*3/uL (ref 0.0–0.1)
Basophils Relative: 0.6 % (ref 0.0–3.0)
Eosinophils Absolute: 0 10*3/uL (ref 0.0–0.7)
Eosinophils Relative: 0.6 % (ref 0.0–5.0)
HCT: 38.3 % (ref 36.0–46.0)
Hemoglobin: 12.8 g/dL (ref 12.0–15.0)
Lymphocytes Relative: 31.9 % (ref 12.0–46.0)
Lymphs Abs: 2.5 10*3/uL (ref 0.7–4.0)
MCHC: 33.6 g/dL (ref 30.0–36.0)
MCV: 92.4 fl (ref 78.0–100.0)
Monocytes Absolute: 0.5 10*3/uL (ref 0.1–1.0)
Monocytes Relative: 6.1 % (ref 3.0–12.0)
Neutro Abs: 4.8 10*3/uL (ref 1.4–7.7)
Neutrophils Relative %: 60.8 % (ref 43.0–77.0)
Platelets: 236 10*3/uL (ref 150.0–400.0)
RBC: 4.14 Mil/uL (ref 3.87–5.11)
RDW: 13.3 % (ref 11.5–15.5)
WBC: 7.9 10*3/uL (ref 4.0–10.5)

## 2020-12-12 LAB — LIPID PANEL
Cholesterol: 200 mg/dL (ref 0–200)
HDL: 60.9 mg/dL (ref >39.00)
LDL Cholesterol: 111 mg/dL — ABNORMAL HIGH (ref 0–99)
NonHDL: 138.8
Total CHOL/HDL Ratio: 3
Triglycerides: 139 mg/dL (ref 0.0–149.0)
VLDL: 27.8 mg/dL (ref 0.0–40.0)

## 2020-12-12 LAB — COMPREHENSIVE METABOLIC PANEL
ALT: 15 U/L (ref 0–35)
AST: 18 U/L (ref 0–37)
Albumin: 4 g/dL (ref 3.5–5.2)
Alkaline Phosphatase: 61 U/L (ref 39–117)
BUN: 10 mg/dL (ref 6–23)
CO2: 29 mEq/L (ref 19–32)
Calcium: 9.2 mg/dL (ref 8.4–10.5)
Chloride: 102 mEq/L (ref 96–112)
Creatinine, Ser: 0.82 mg/dL (ref 0.40–1.20)
GFR: 69.45 mL/min (ref >60.00)
Glucose, Bld: 80 mg/dL (ref 70–99)
Potassium: 4.2 mEq/L (ref 3.5–5.1)
Sodium: 138 mEq/L (ref 135–145)
Total Bilirubin: 0.8 mg/dL (ref 0.2–1.2)
Total Protein: 7.1 g/dL (ref 6.0–8.3)

## 2020-12-12 IMAGING — MR MR MRA HEAD W/O CM
2 series · 21 of 48 positions shown · non-contrast
Comparison: MRI head [DATE]

CLINICAL DATA: Acute neuro deficit.  Suspect stroke

EXAM:
MRA HEAD WITHOUT CONTRAST
TECHNIQUE: Angiographic images of the Circle of Willis were acquired using MRA
technique without intravenous contrast.

[Series 5: vessel_scout_head_msum · sagittal · 6.0mm · 0.59mm/px · 6 of 24 slices shown]
[im 1/24]
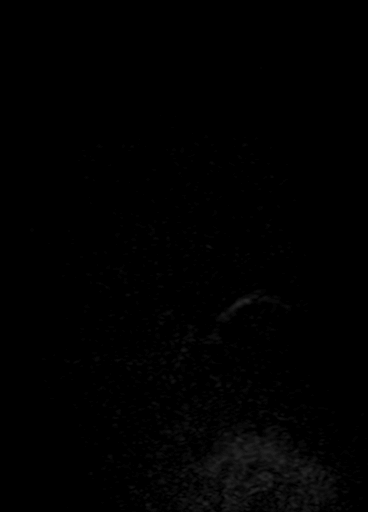
[im 5/24]
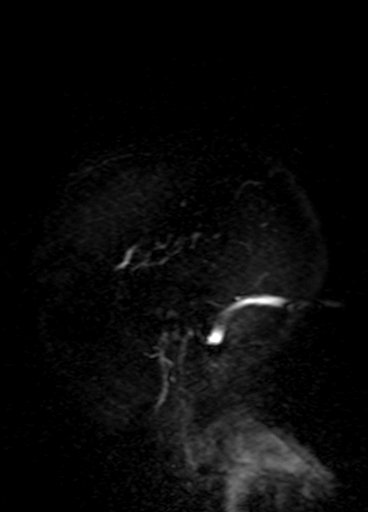
[im 10/24]
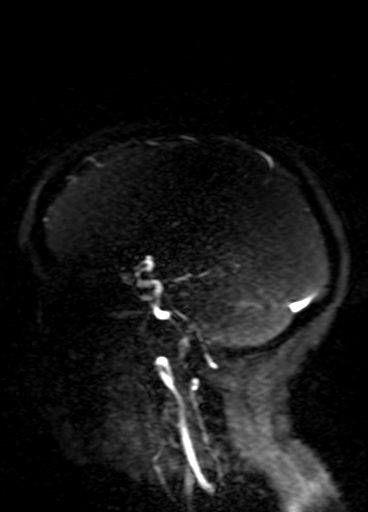
[im 14/24]
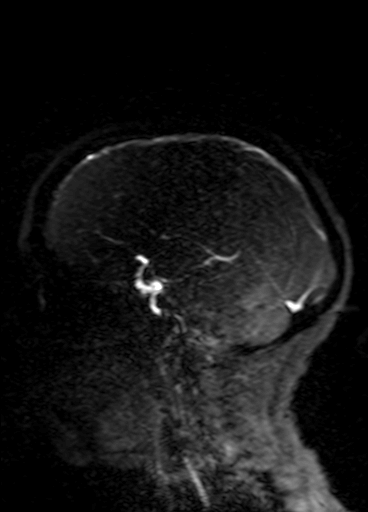
[im 19/24]
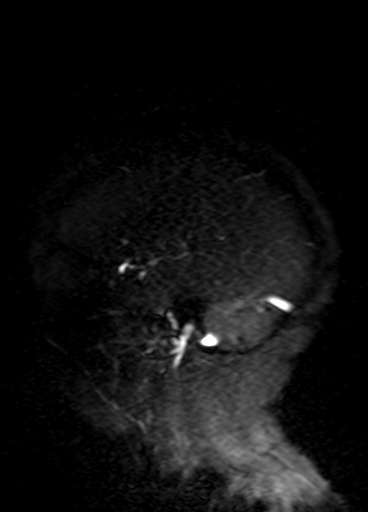
[im 24/24]
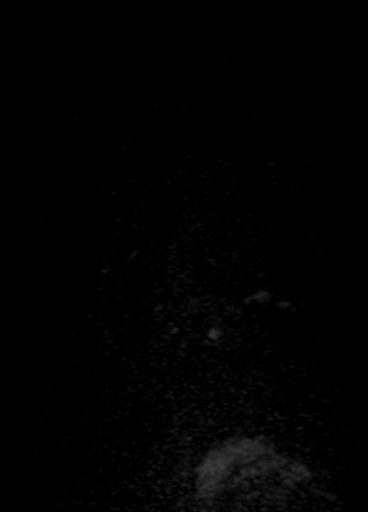

[Series 10: TOF · axial · 0.6mm · 0.35mm/px · z∈[-76,+20]mm · 15 of 172 slices shown]
[im 1/172]
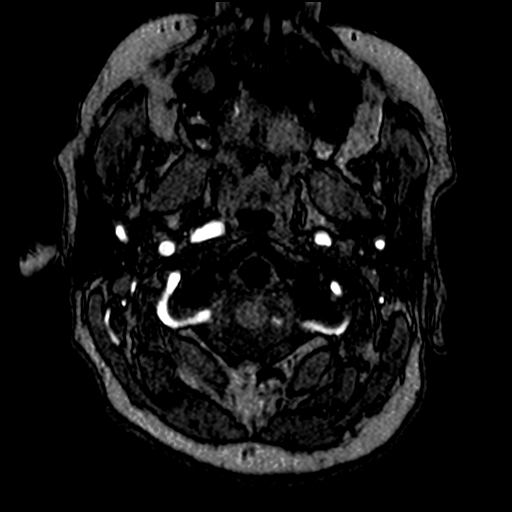
[im 5/172]
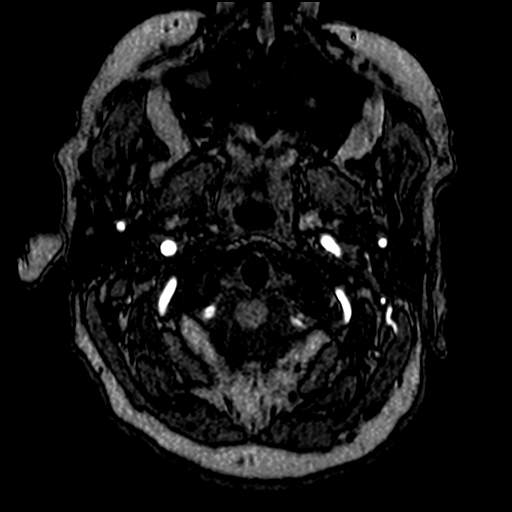
[im 9/172]
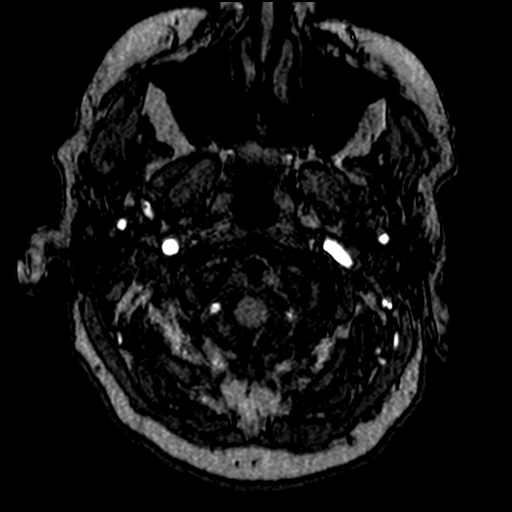
[im 13/172]
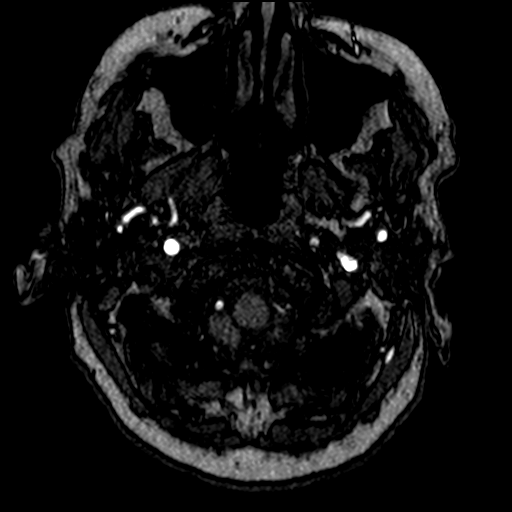
[im 17/172]
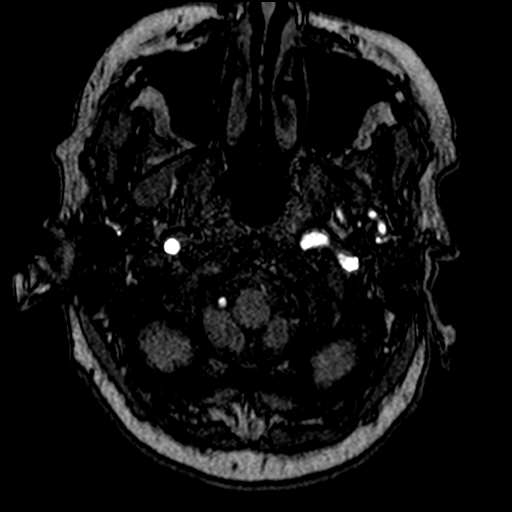
[im 21/172]
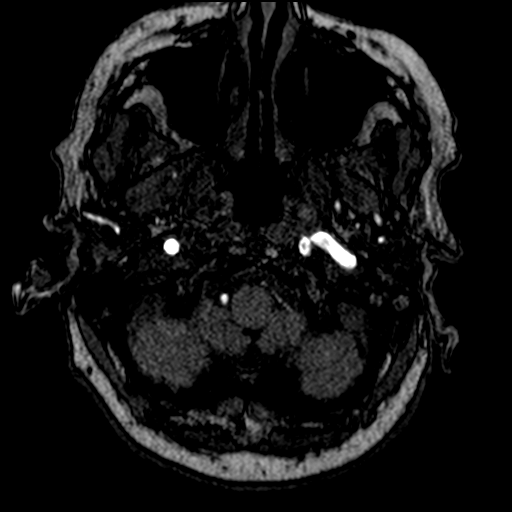
[im 30/172]
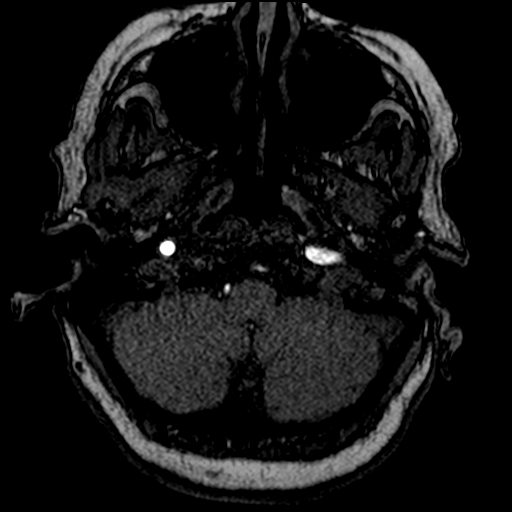
[im 55/172]
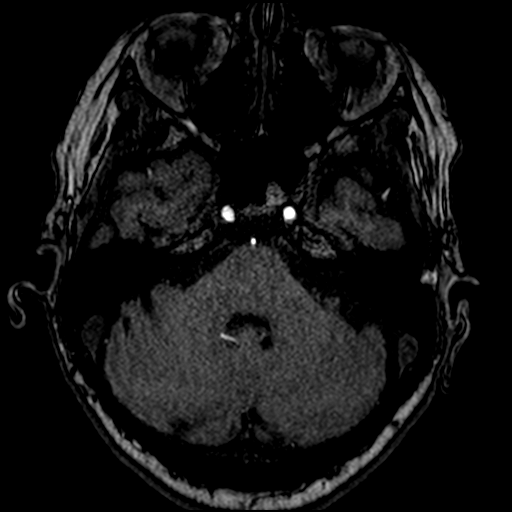
[im 76/172]
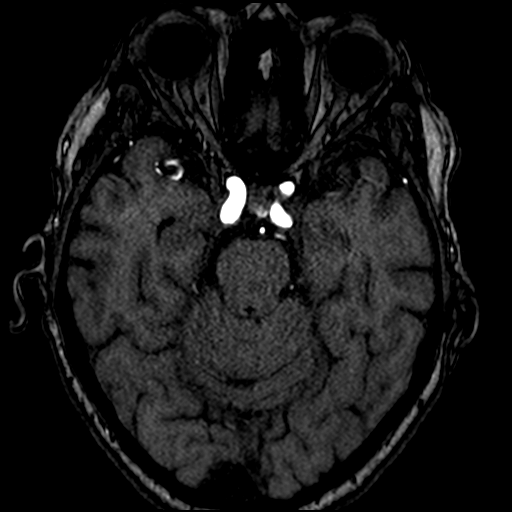
[im 88/172]
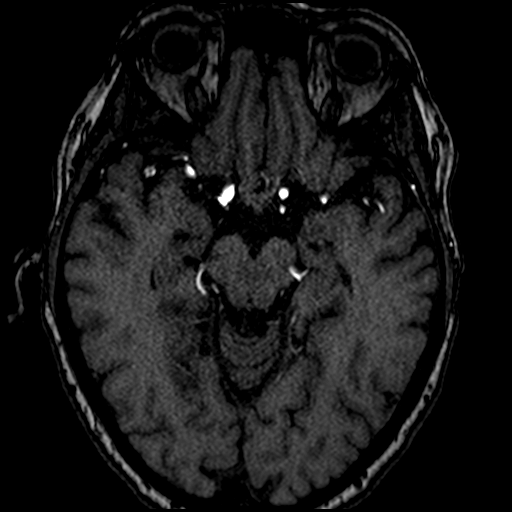
[im 96/172]
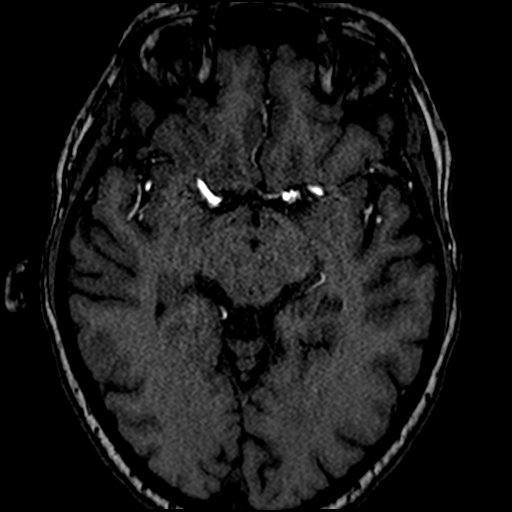
[im 117/172]
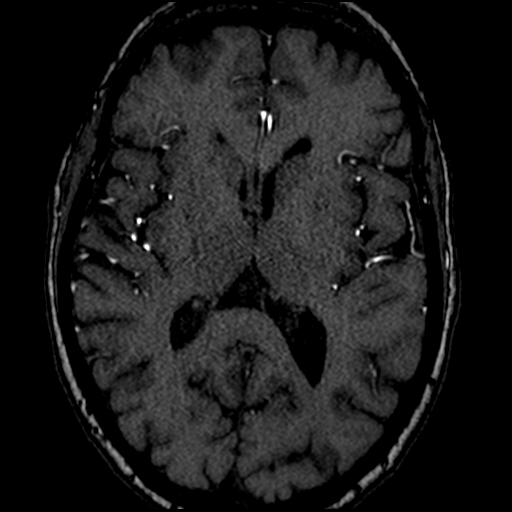
[im 142/172]
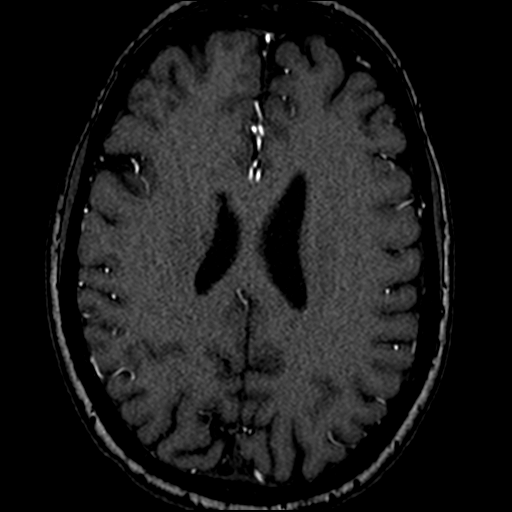
[im 146/172]
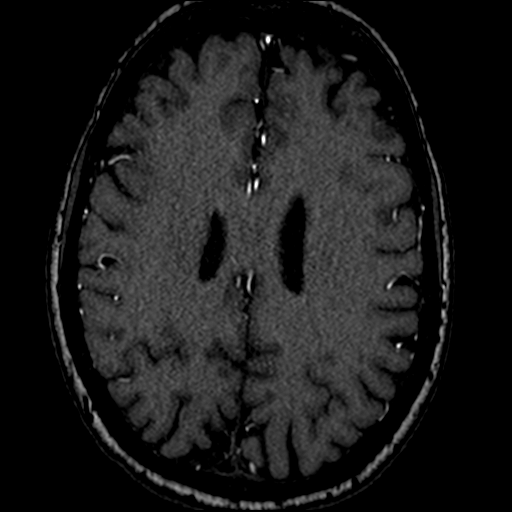
[im 163/172]
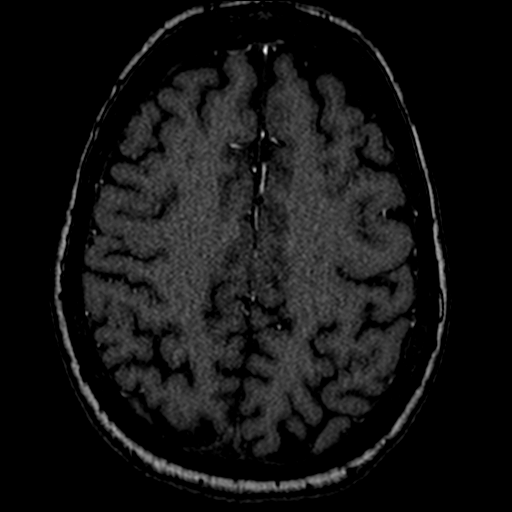

[21 of 48 positions shown; findings below may reference images not displayed]

FINDINGS: Anterior circulation: Internal carotid artery widely patent through
the skull base. Anterior and middle cerebral arteries patent
bilaterally without stenosis or large vessel occlusion. Hypoplastic
left A1 segment. Negative for aneurysm.

Posterior circulation: Hypoplastic vertebral and basilar arteries
due to fetal origin of the posterior cerebral artery bilaterally. No
significant stenosis in the posterior circulation. Left vertebral
artery appears to end in PICA. Negative for aneurysm.

Anatomic variants: None

Other: None
IMPRESSION: No significant intracranial stenosis.

## 2020-12-12 IMAGING — MR MR MRA NECK WO/W CM
6 of 8 series · 34 of 48 positions shown · IV contrast (gadavist)
Comparison: None.

CLINICAL DATA: Acute neuro deficit.  Rule out stroke

EXAM:
MRA NECK WITHOUT AND WITH CONTRAST
TECHNIQUE: Multiplanar and multiecho pulse sequences of the neck were obtained
without and with intravenous contrast. Angiographic images of the
neck were obtained using MRA technique without and with intravenous
contrast.
CONTRAST:  5mL GADAVIST GADOBUTROL 1 MMOL/ML IV SOLN

[Series 3: tof_2d_tra · axial · 3.5mm · 0.43mm/px · z∈[-218,-74]mm · 5 of 60 slices shown]
[im 1/60]
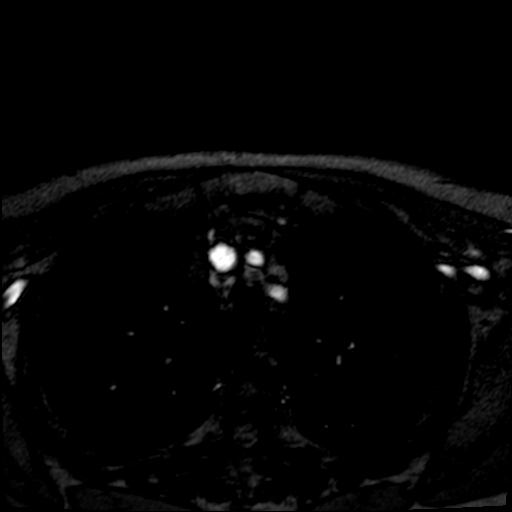
[im 15/60]
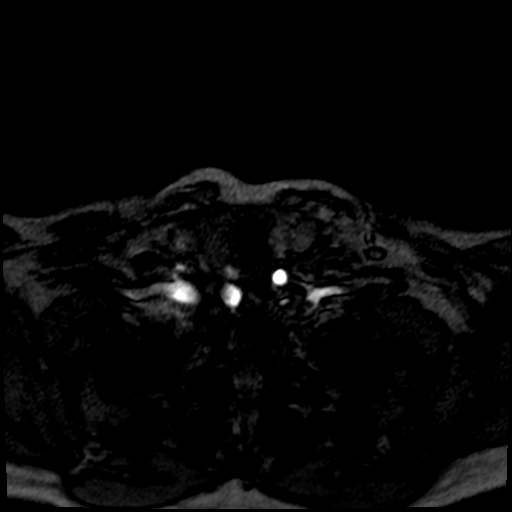
[im 30/60]
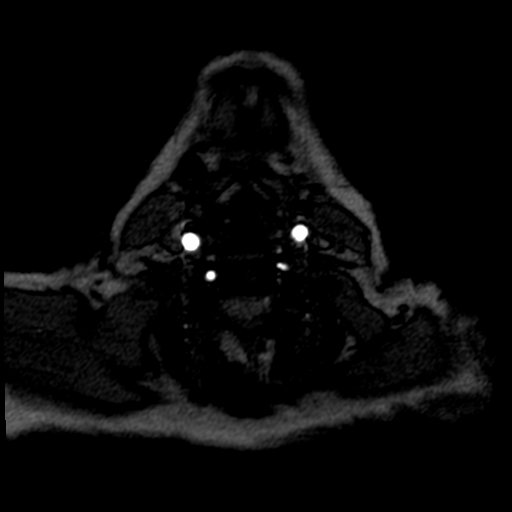
[im 45/60]
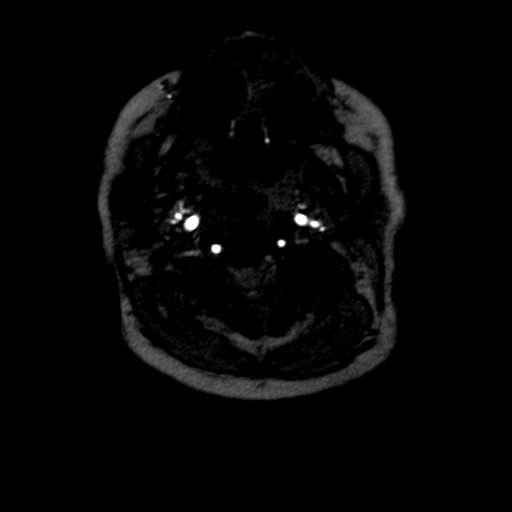
[im 60/60]
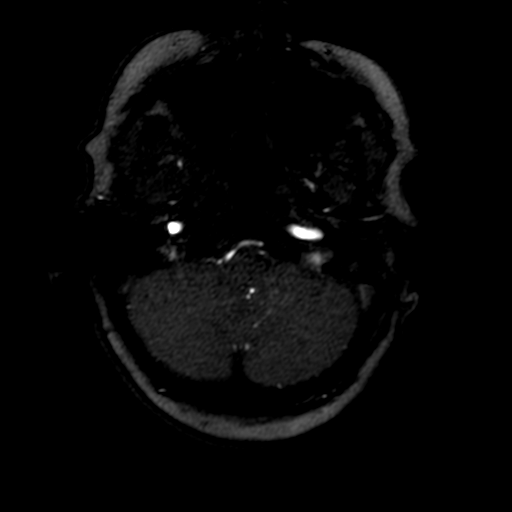

[Series 6: tof_2d_tra_mip_tra · axial · 148.1mm · 0.43mm/px · 1 of 1 slices shown]
[im 1/1]
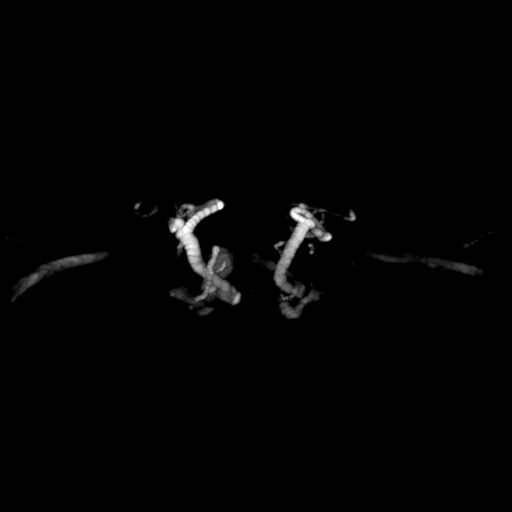

[Series 7: (id)_cor_pre · coronal · 0.8mm · 0.78mm/px · 8 of 96 slices shown]
[im 1/96]
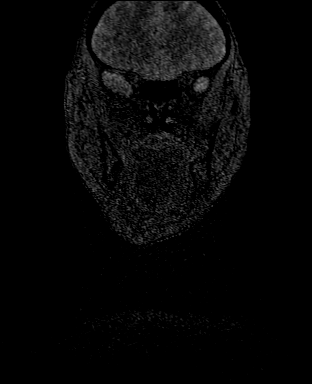
[im 14/96]
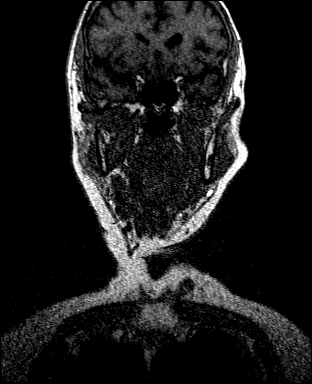
[im 28/96]
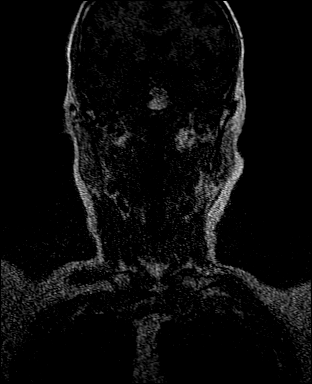
[im 41/96]
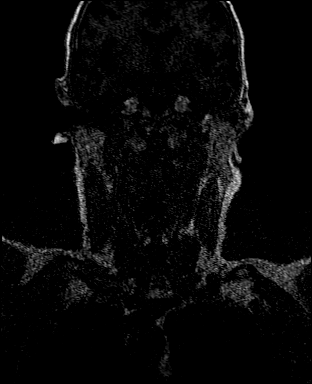
[im 55/96]
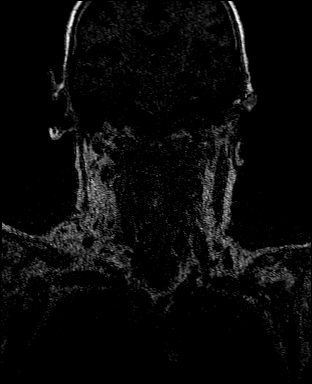
[im 68/96]
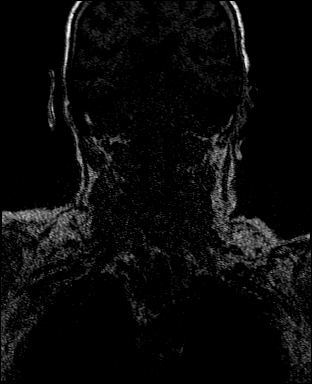
[im 82/96]
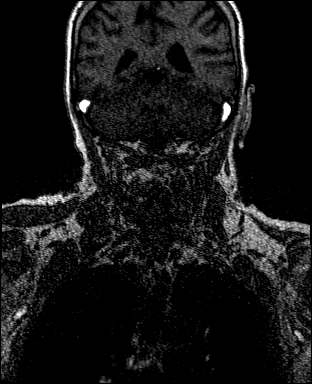
[im 96/96]
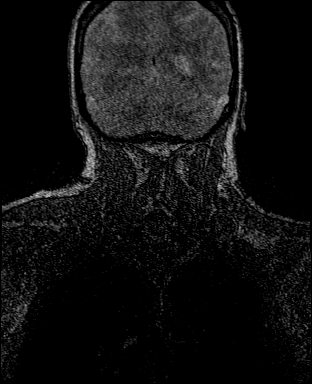

[Series 9: (id)_cor_post · coronal · 0.8mm · 0.78mm/px · 8 of 96 slices shown]
[im 1/96]
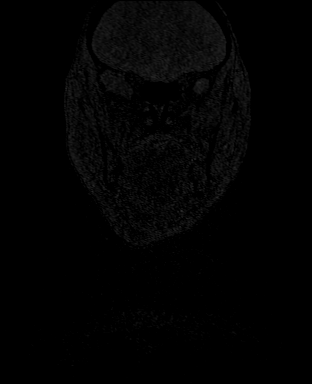
[im 14/96]
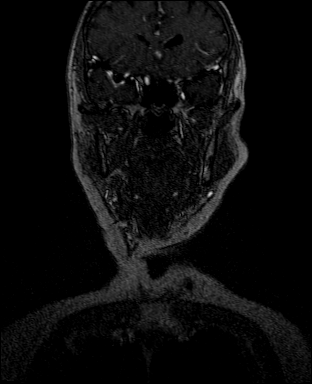
[im 28/96]
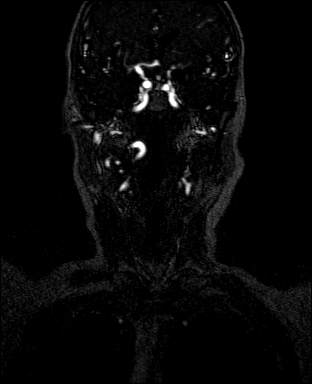
[im 41/96]
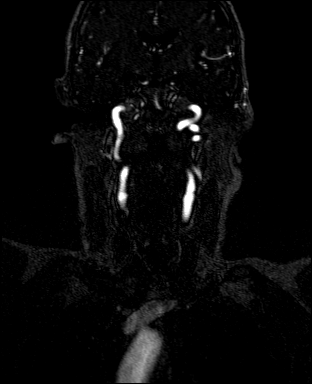
[im 55/96]
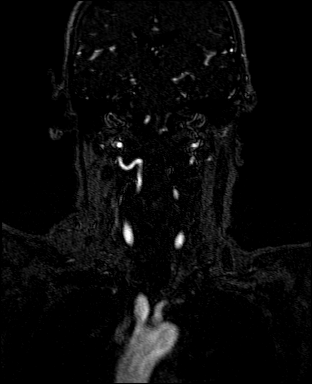
[im 68/96]
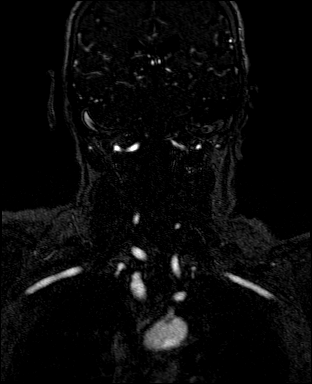
[im 82/96]
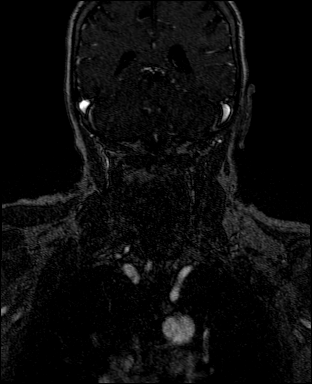
[im 96/96]
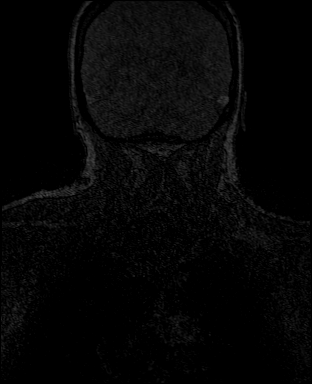

[Series 10: (id)_cor_post_sub · coronal · 0.8mm · 0.78mm/px · 8 of 96 slices shown]
[im 1/96]
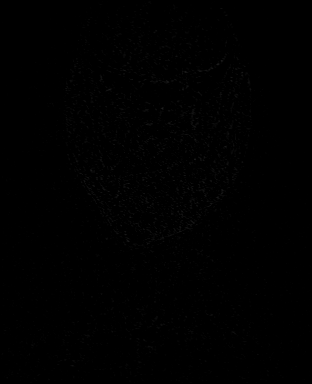
[im 14/96]
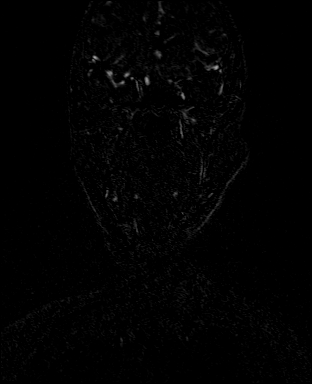
[im 28/96]
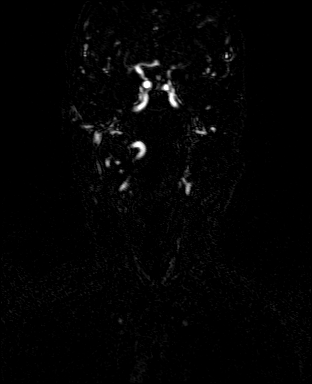
[im 41/96]
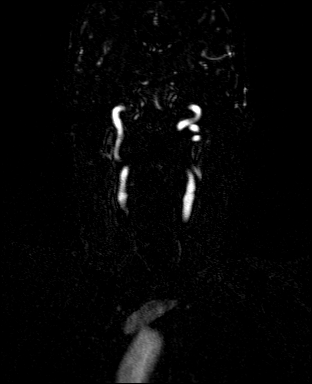
[im 55/96]
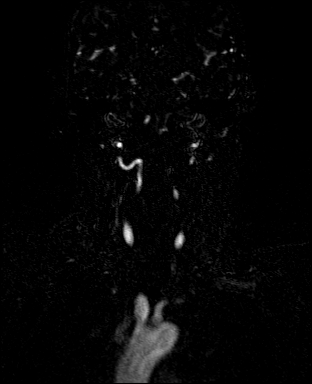
[im 68/96]
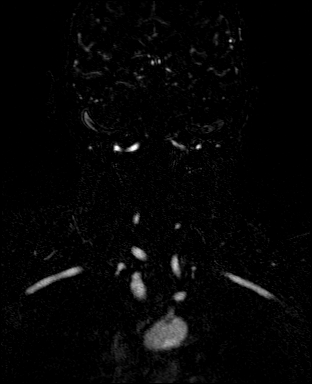
[im 82/96]
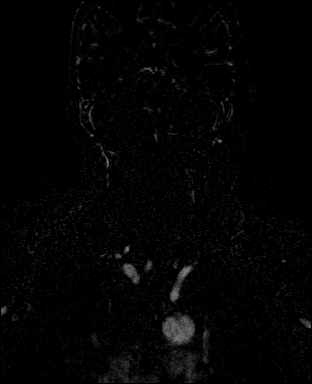
[im 96/96]
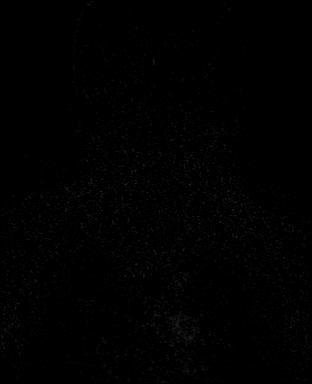

[Series 12: (id)_cor_post_venous · coronal · 0.8mm · 0.78mm/px · 4 of 96 slices shown]
[im 1/96]
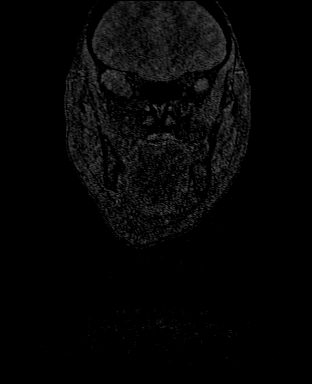
[im 14/96]
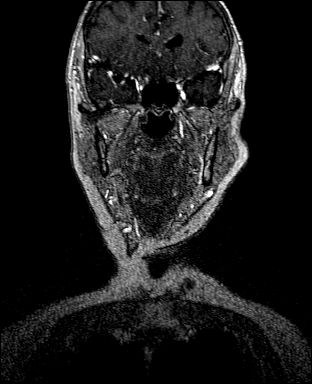
[im 28/96]
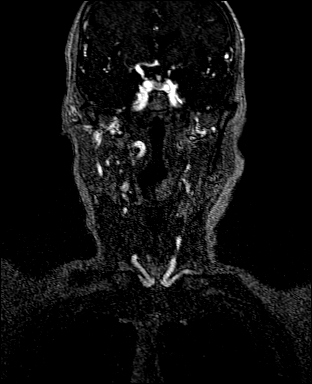
[im 41/96]
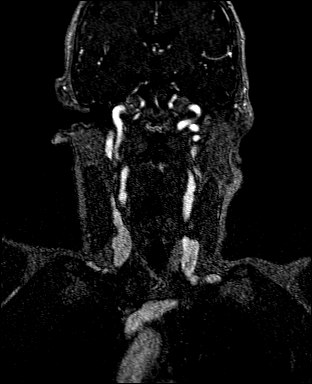

[34 of 48 positions shown; findings below may reference images not displayed]

FINDINGS: Antegrade flow in both carotid and vertebral arteries. Normal aortic
arch and proximal great vessels

Both carotid arteries are widely patent without stenosis

Both vertebral arteries are patent. Left vertebral artery is
hypoplastic and ends in PICA. Hypoplastic basilar due to fetal
origin of the posterior cerebral artery bilaterally.
IMPRESSION: Negative MRA neck

## 2020-12-12 IMAGING — MR MR HEAD W/O CM
10 series · 48 of 48 positions shown · non-contrast
Comparison: MRI head [DATE]

CLINICAL DATA: Acute neuro deficit.

EXAM:
MRI HEAD WITHOUT CONTRAST
TECHNIQUE: Multiplanar, multiecho pulse sequences of the brain and surrounding
structures were obtained without intravenous contrast.

[Series 5: DWI · axial · 3.0mm · 1.36mm/px · z∈[-86,+67]mm · 9 of 104 slices shown (1 of 4)]
[im 1/104]
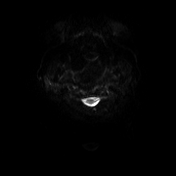
[im 13/104]
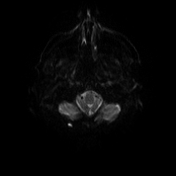
[im 26/104]
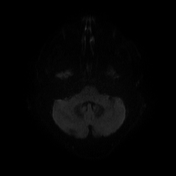
[im 39/104]
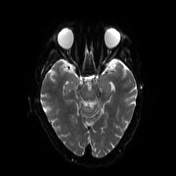
[im 52/104]
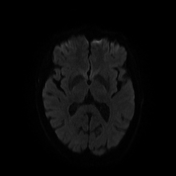
[im 65/104]
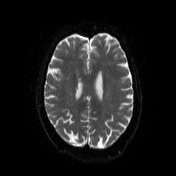
[im 78/104]
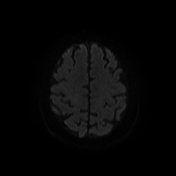
[im 91/104]
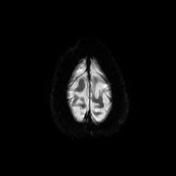
[im 104/104]
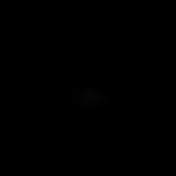

[Series 6: DWI · axial · 3.0mm · 1.36mm/px · z∈[-86,+67]mm · 4 of 52 slices shown (2 of 4)]
[im 1/52]
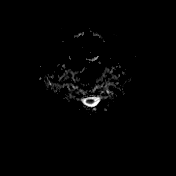
[im 18/52]
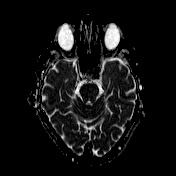
[im 35/52]
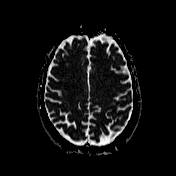
[im 52/52]
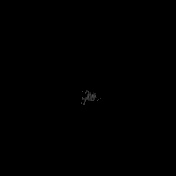

[Series 7: T1 · sagittal · 5.0mm · 0.75mm/px · 2 of 24 slices shown (1 of 2)]
[im 1/24]
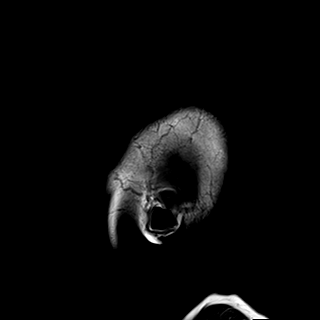
[im 24/24]
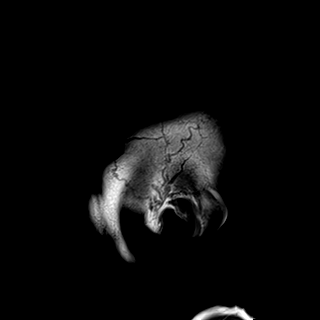

[Series 8: T2 · axial · 5.0mm · 0.62mm/px · z∈[-81,+62]mm · 2 of 23 slices shown (1 of 2)]
[im 1/23]
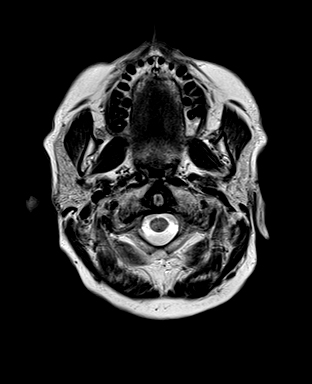
[im 23/23]
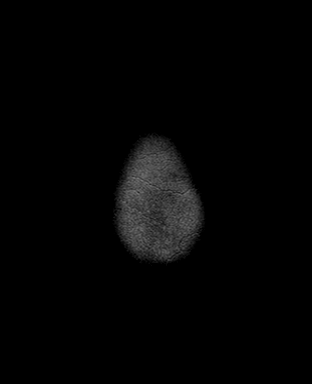

[Series 9: swi_images · axial · 3.0mm · 0.75mm/px · z∈[-92,+73]mm · 4 of 56 slices shown]
[im 1/56]
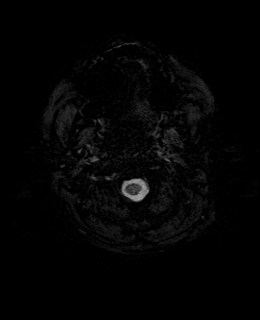
[im 19/56]
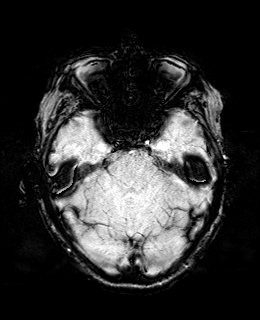
[im 37/56]
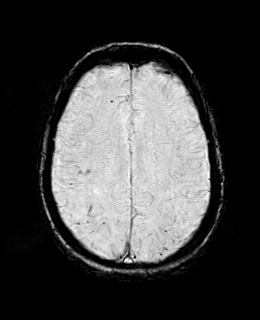
[im 56/56]
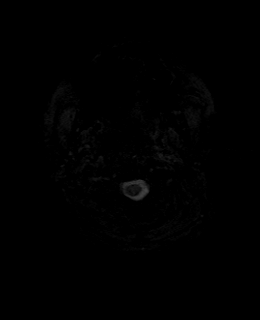

[Series 11: T1 · axial · 1.0mm · 0.94mm/px · z∈[-89,+66]mm · 12 of 157 slices shown (2 of 2)]
[im 1/157]
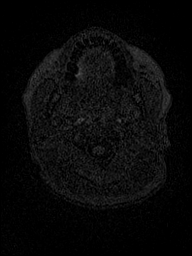
[im 15/157]
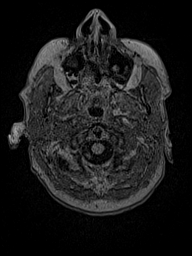
[im 29/157]
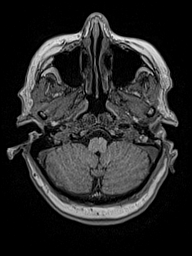
[im 43/157]
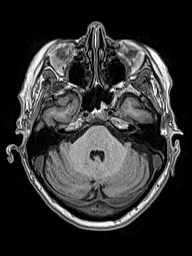
[im 57/157]
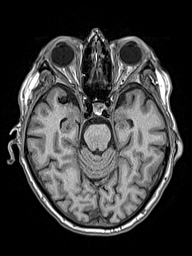
[im 71/157]
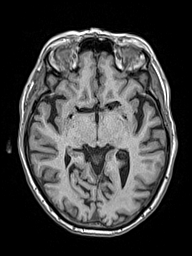
[im 86/157]
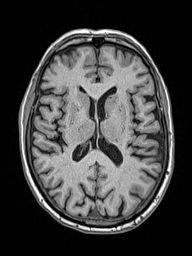
[im 100/157]
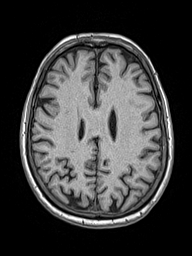
[im 114/157]
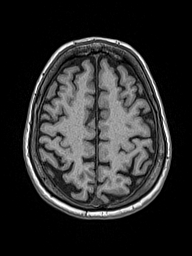
[im 128/157]
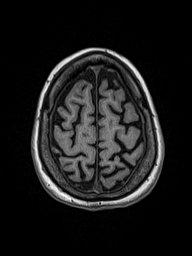
[im 142/157]
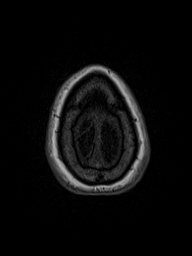
[im 157/157]
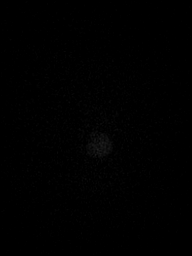

[Series 12: FLAIR · axial · 3.0mm · 0.75mm/px · z∈[-86,+67]mm · 4 of 52 slices shown]
[im 1/52]
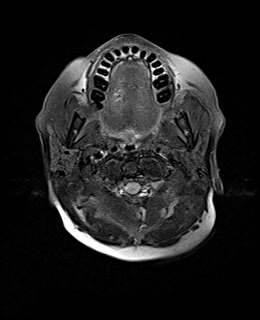
[im 18/52]
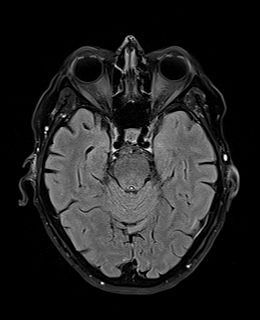
[im 35/52]
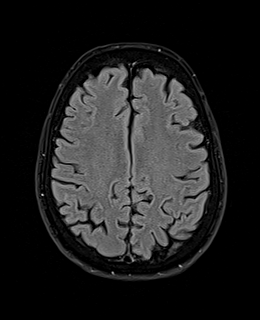
[im 52/52]
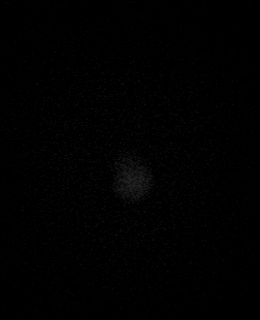

[Series 13: DWI · coronal · 5.0mm · 1.31mm/px · 6 of 76 slices shown (3 of 4)]
[im 1/76]
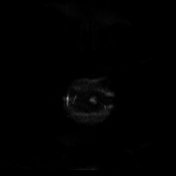
[im 16/76]
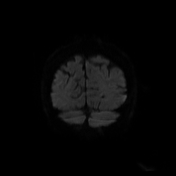
[im 31/76]
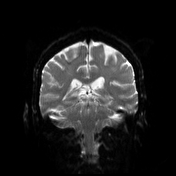
[im 46/76]
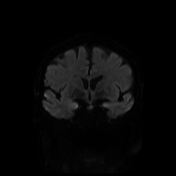
[im 61/76]
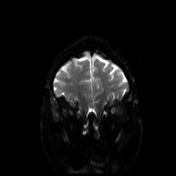
[im 76/76]
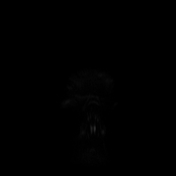

[Series 14: DWI · coronal · 5.0mm · 1.31mm/px · 3 of 38 slices shown (4 of 4)]
[im 1/38]
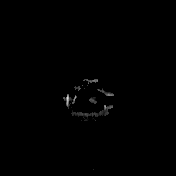
[im 19/38]
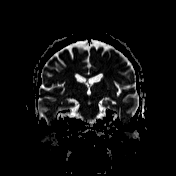
[im 38/38]
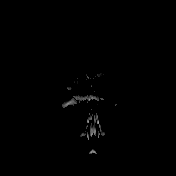

[Series 15: T2 · coronal · 5.0mm · 0.57mm/px · 2 of 30 slices shown (2 of 2)]
[im 1/30]
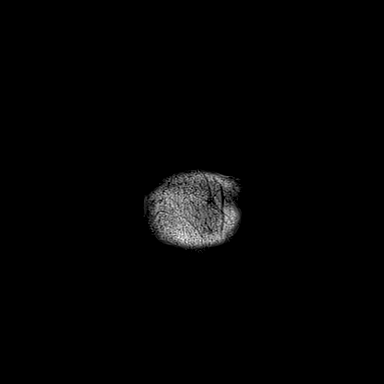
[im 30/30]
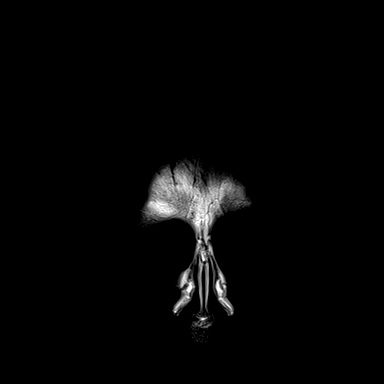

[48 of 48 positions shown; findings below may reference images not displayed]

FINDINGS: Brain: Ventricle size and cerebral volume normal for age. Negative
for acute infarct. Minimal white matter changes with scattered small
white matter hyperintensities bilaterally. Hemosiderin staining
right parietal lobe. This may have progressed from the prior study
however the progression could be due to different technique.

Negative for mass or edema in the brain

Vascular: Normal arterial flow voids

Skull and upper cervical spine: Negative

Sinuses/Orbits: Mild mucosal edema right maxillary sinus. Remaining
sinuses clear. Bilateral cataract extraction

Other: None
IMPRESSION: Negative for acute infarct. Very mild white matter changes
consistent with chronic microvascular ischemia

Hemosiderin staining on the surface of the brain in the right
parietal lobe consistent with prior subarachnoid hemorrhage.
Possible progression since [CP] although this could be due to
different acquisition techniques. Correlate with prior history of
trauma or intracranial hemorrhage. Consider follow-up MRI brain with
contrast to evaluate for underlying vascular malformation.

## 2020-12-12 MED ORDER — GADOBUTROL 1 MMOL/ML IV SOLN
5.0000 mL | Freq: Once | INTRAVENOUS | Status: AC | PRN
  Administered 2020-12-12: 5 mL via INTRAVENOUS

## 2020-12-12 NOTE — Progress Notes (Addendum)
Heather Hodges is a 75 y.o. female here for a new problem.  I acted as a Education administrator for [employer], PA-C [employer], LPN   History of Present Illness:   Chief Complaint  Patient presents with   Dizziness    HPI  Dizziness Pt has had 2 episodes of nausea and dizziness when she was at physical therapy when the therapist was massaging her head. Pt says her head is tender to touch from base of head all the way to the top of her head. Symptoms lasted for about a few minutes. Pt was seen at Sports Medicine on 9/12 and was told to follow up with PCP about dizziness.  Drinks water daily -- suspects that she drinks enough water. Feels like her eyes are getting blurry at times. L eye is blurry at times but not consistently. Denies: sudden loss of vision, double vision.  Has noted kaleidoscope vision changes in both eyes at times for at least one year. She went to the eye doctor who said if it is not persistent it doesn't require urgent work-up.  Had orthostatic hypotension on 12/10/20 with sports med provider.  Currently taking tramadol twice day.  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  Notes are in the chart from this admission from [place]  CT Head Impression  The high density material described in the right hemisphere probably is associated with a hemorrhagic contusion related to the patient's fall. There is also some blood along the falx.  Past Medical History:  Diagnosis Date   Arthritis    Colon polyps    Diverticulosis    History of chicken pox    Hyperlipidemia    Hypothyroidism    Osteoporosis    Vitamin D deficiency      Social History   Tobacco Use   Smoking status: Never   Smokeless tobacco: Never  Substance Use Topics   Alcohol use: Never   Drug use: Never    Past Surgical History:  Procedure  Laterality Date   TONSILLECTOMY AND ADENOIDECTOMY      Family History  Problem Relation Age of Onset   Arthritis Mother    Breast cancer Mother    High Cholesterol Mother    Heart attack Father    High Cholesterol Father    Arthritis Brother    Hearing loss Brother    Hypercholesterolemia Brother     Allergies  Allergen Reactions   Risedronate Sodium Other (See Comments)    TMJ dysfunction    Current Medications:   Current Outpatient Medications:    atorvastatin (LIPITOR) 40 MG tablet, Take 1 tablet (40 mg total) by mouth daily., Disp: 90 tablet, Rfl: 3   b complex vitamins capsule, Take 1 capsule by mouth every other day., Disp: , Rfl:    Calcium Carbonate-Vitamin D (CALCIUM 500+D PO), Take 2 each by mouth every other day., Disp: , Rfl:    gabapentin (NEURONTIN) 100 MG capsule, Take 100 mg by mouth 3 (three) times daily., Disp: , Rfl:    ketoconazole (NIZORAL) 2 % cream, Apply to affected area 1-2 times daily, Disp: 15 g, Rfl: 0   levothyroxine (SYNTHROID) 75 MCG tablet, Take 1 tablet (75 mcg total) by mouth every other day., Disp: 90 tablet, Rfl: 1   levothyroxine (SYNTHROID) 88 MCG tablet, Take 1 tablet (88 mcg total) by mouth every  other day., Disp: 90 tablet, Rfl: 1   lidocaine (XYLOCAINE) 4 % external solution, Apply topically., Disp: , Rfl:    Lidocaine 4 % PTCH, Place onto the skin., Disp: , Rfl:    meloxicam (MOBIC) 7.5 MG tablet, Take 1 tablet (7.5 mg total) by mouth daily., Disp: 90 tablet, Rfl: 3   Tart Cherry 1200 MG CAPS, Take 1 capsule by mouth daily in the afternoon., Disp: , Rfl:    traMADol (ULTRAM) 50 MG tablet, Take 50 mg by mouth 2 (two) times daily., Disp: , Rfl:    Review of Systems:   ROS Negative unless otherwise specified per HPI.  Vitals:   Vitals:   12/12/20 0935  BP: 120/70  Pulse: 77  Temp: 97.8 F (36.6 C)  TempSrc: Temporal  SpO2: 97%  Weight: 129 lb 6.1 oz (58.7 kg)  Height: 5' (1.524 m)     Body mass index is 25.27  kg/m.  Physical Exam:   Physical Exam Vitals and nursing note reviewed.  Constitutional:      General: She is not in acute distress.    Appearance: She is well-developed. She is not ill-appearing or toxic-appearing.  HENT:     Head:     Comments: Tenderness to crown of head Cardiovascular:     Rate and Rhythm: Normal rate and regular rhythm.     Pulses: Normal pulses.     Heart sounds: Normal heart sounds, S1 normal and S2 normal.     Comments: No LE edema Pulmonary:     Effort: Pulmonary effort is normal.     Breath sounds: Normal breath sounds.  Skin:    General: Skin is warm and dry.  Neurological:     General: No focal deficit present.     Mental Status: She is alert.     GCS: GCS eye subscore is 4. GCS verbal subscore is 5. GCS motor subscore is 6.     Sensory: Sensation is intact.     Motor: Motor function is intact.     Coordination: Coordination is intact.     Comments: Has reproduction of lightheaded sensation with EOMs  Psychiatric:        Speech: Speech normal.        Behavior: Behavior normal. Behavior is cooperative.    Assessment and Plan:   1. Intermittent lightheadedness Unclear etiology of symptoms Reviewed case with Dr. Dimas Chyle, will obtain stat MRI brain and MRA of head and neck for further evaluation Likely will need neurology referral given ongoing symptoms Patient was instructed to go to the emergency room if she developed any worsening symptoms in the interim Encouraged adequate hydration given recent episode of orthostatic hypotension  2. Hyperlipidemia, unspecified hyperlipidemia type Update lipid panel today   CMA or LPN served as scribe during this visit. History, Physical, and Plan performed by medical provider. The above documentation has been reviewed and is accurate and complete.  Time spent with patient today was 55 minutes which consisted of chart review of most recent office notes and prior hospitalization, discussing diagnosis  with patient's physical therapist and my supervising physician, work up, treatment answering questions and documentation.   Inda Coke, PA-C

## 2020-12-12 NOTE — Patient Instructions (Signed)
It was great to see you!  We are updating blood work today  We are obtaining a scan of your head and neck today to figure this out  Follow-up will be dependent on results of test  Headache Precautions: Contact a doctor if: Your symptoms are not helped by medicine. You have a headache that feels different than the other headaches. You feel sick to your stomach (nauseous) or you throw up (vomit). You have a fever. Get help right away if: Your headache gets very bad quickly. Your headache gets worse after a lot of physical activity. You keep throwing up. You have a stiff neck. You have trouble seeing. You have trouble speaking. You have pain in the eye or ear. Your muscles are weak or you lose muscle control. You lose your balance or have trouble walking. You feel like you will pass out (faint) or you pass out. You are mixed up (confused). You have a seizure.   Take care,  Inda Coke PA-C

## 2020-12-12 NOTE — Addendum Note (Signed)
Addended by: Erlene Quan on: 12/12/2020 12:34 PM   Modules accepted: Orders

## 2020-12-13 ENCOUNTER — Telehealth: Payer: Self-pay

## 2020-12-13 ENCOUNTER — Other Ambulatory Visit: Payer: Self-pay | Admitting: Physician Assistant

## 2020-12-13 ENCOUNTER — Ambulatory Visit (HOSPITAL_COMMUNITY): Admission: RE | Admit: 2020-12-13 | Payer: Medicare Other | Source: Ambulatory Visit

## 2020-12-13 ENCOUNTER — Other Ambulatory Visit (HOSPITAL_COMMUNITY): Payer: Medicare Other

## 2020-12-13 DIAGNOSIS — R519 Headache, unspecified: Secondary | ICD-10-CM

## 2020-12-13 NOTE — Telephone Encounter (Signed)
See result notes. 

## 2020-12-13 NOTE — Telephone Encounter (Signed)
Pt was returning a call.  

## 2020-12-14 ENCOUNTER — Other Ambulatory Visit: Payer: Self-pay

## 2020-12-14 ENCOUNTER — Ambulatory Visit (HOSPITAL_COMMUNITY)
Admission: RE | Admit: 2020-12-14 | Discharge: 2020-12-14 | Disposition: A | Discharge status: Home / Self Care | Payer: Medicare Other | Source: Ambulatory Visit | Attending: Physician Assistant | Admitting: Physician Assistant

## 2020-12-14 DIAGNOSIS — R519 Headache, unspecified: Secondary | ICD-10-CM | POA: Insufficient documentation

## 2020-12-14 DIAGNOSIS — R42 Dizziness and giddiness: Secondary | ICD-10-CM

## 2020-12-14 MED ORDER — GADOBUTROL 1 MMOL/ML IV SOLN
6.0000 mL | Freq: Once | INTRAVENOUS | Status: AC | PRN
  Administered 2020-12-14: 6 mL via INTRAVENOUS

## 2020-12-17 ENCOUNTER — Encounter: Payer: Self-pay | Admitting: Neurology

## 2020-12-19 ENCOUNTER — Encounter: Payer: Self-pay | Admitting: Physical Therapy

## 2020-12-19 ENCOUNTER — Ambulatory Visit (INDEPENDENT_AMBULATORY_CARE_PROVIDER_SITE_OTHER): Payer: Medicare Other | Admitting: Physical Therapy

## 2020-12-19 ENCOUNTER — Other Ambulatory Visit: Payer: Self-pay

## 2020-12-19 DIAGNOSIS — M62838 Other muscle spasm: Secondary | ICD-10-CM | POA: Diagnosis not present

## 2020-12-19 DIAGNOSIS — M542 Cervicalgia: Secondary | ICD-10-CM

## 2020-12-19 DIAGNOSIS — M546 Pain in thoracic spine: Secondary | ICD-10-CM

## 2020-12-19 NOTE — Therapy (Signed)
[facility] [address] [place], [place], [address] Phone: [phone]   Fax:  [phone]  Physical Therapy Treatment  Patient Details  Name: Heather Hodges MRN: [id number] Date of Birth: 13-Nov-1945 Referring Provider (PT): [name]   Encounter Date: 12/19/2020   PT End of Session - 12/19/20 1205     Visit Number 3    Number of Visits 12    Date for PT Re-Evaluation 01/07/21    Authorization Type Medicare    PT Start Time 1105    PT Stop Time 1143    PT Time Calculation (min) 38 min    Activity Tolerance Patient tolerated treatment well;Patient limited by pain    Behavior During Therapy [facility] for tasks assessed/performed             Past Medical History:  Diagnosis Date   Arthritis    Colon polyps    Diverticulosis    History of chicken pox    Hyperlipidemia    Hypothyroidism    Osteoporosis    Vitamin D deficiency     Past Surgical History:  Procedure Laterality Date   TONSILLECTOMY AND ADENOIDECTOMY      There were no vitals filed for this visit.   Subjective Assessment - 12/19/20 1204     Subjective Pt has had 2 MRIs recently. States much discomfort during and after test in neck and head. Still feels that laying flat or looking up into extension causes pain and pressure at top of head. She has not had any more dizziness or nausea. She has been doing HEP. Still feels tightness and soreness in bil neck muscles.    Currently in Pain? Yes    Pain Score 2     Pain Location Neck    Pain Orientation Left;Right    Pain Descriptors / Indicators Aching    Pain Type Chronic pain    Pain Onset More than a month ago    Pain Frequency Intermittent                               [facility] Adult PT Treatment/Exercise - 12/19/20 0001       Exercises   Exercises Neck      Neck Exercises: Seated   Cervical Rotation 10 reps    Shoulder Rolls 15 reps    Other Seated Exercise scap retract x 15 in  standing.      Neck Exercises: Stretches   Upper Trapezius Stretch 3 reps;10 seconds;Right;Left    Other Neck Stretches shoulder pulley, flexion x 20; cues to relax shoulders.      Manual Therapy   Manual Therapy Soft tissue mobilization;Passive ROM;Manual Traction                       PT Short Term Goals - 12/19/20 1206       PT SHORT TERM GOAL #1   Title Pt to be independent with initial HEP    Time 2    Period Weeks    Status Achieved    Target Date 12/10/20               PT Long Term Goals - 11/26/20 1213       PT LONG TERM GOAL #1   Title Pt to be independent with final HEP    Time 6    Period Weeks    Status New  Target Date 01/07/21      PT LONG TERM GOAL #2   Title Pt to report decreased soreness and tension in neck, to 0-2/10 with movment. 6    Time 6    Period Weeks    Status New    Target Date 01/07/21      PT LONG TERM GOAL #3   Title Pt to demo improved cervical rotation to at least 45 deg bilaterally, to improve ability for head turns and IADLS.    Time 6    Period Weeks    Status New    Target Date 01/07/21      PT LONG TERM GOAL #4   Title Pt to demo improved soft tissue restrictions in cervical and thoracic region to be [facility] for decreased pain.    Time 6    Period Weeks    Status New    Target Date 01/07/21      PT LONG TERM GOAL #5   Title Pt to demo ability for shoulder elevation and strengthening without pain in thoracic spine , to improve ability for IADLs.    Time 6    Period Weeks    Status New    Target Date 01/07/21                   Plan - 12/19/20 1206     Clinical Impression Statement Pt able to perform light ther ex today, without increased pain. Ther ex focus on decreasing muscle tension in cervical musculature. She was able to do shoulder elevation today, with less pain in t-spine. She has significant muscle tension in bil UT and cervical region. Discussed ways to try to relax, deep breathing,  meditation, use of moist heat, to try to decrease muscle tension and over-activation. Pt requires max cuing to perform ther ex correctly today.    Personal Factors and Comorbidities Comorbidity 1;Time since onset of injury/illness/exacerbation    Examination-Activity Limitations Bend;Dressing;Lift;Locomotion Level;Reach Overhead    Stability/Clinical Decision Making Evolving/Moderate complexity    Rehab Potential Good    PT Frequency 2x / week    PT Duration 6 weeks    PT Treatment/Interventions ADLs/Self Care Home Management;Cryotherapy;Electrical Stimulation;Iontophoresis 4mg /ml Dexamethasone;Moist Heat;Traction;Therapeutic exercise;Therapeutic activities;Functional mobility training;DME Instruction;Neuromuscular re-education;Patient/family education;Manual techniques;Passive range of motion;Dry needling;Spinal Manipulations;Joint Manipulations;Taping;Vestibular;Gait training;Ultrasound    PT Home Exercise Plan [email or web address]    Consulted and Agree with Plan of Care Patient             Patient will benefit from skilled therapeutic intervention in order to improve the following deficits and impairments:  Hypomobility, Decreased activity tolerance, Decreased strength, Pain, Increased fascial restricitons, Decreased mobility, Increased muscle spasms, Decreased range of motion, Improper body mechanics, Impaired flexibility  Visit Diagnosis: Cervicalgia  Pain in thoracic spine  Other muscle spasm     Problem List Patient Active Problem List   Diagnosis Date Noted   Osteoporosis    Hypothyroidism    Hyperlipidemia    Diverticulosis    Arthritis    Chronic pain syndrome 02/03/2018   Contusion of left lung 07/20/2017   Cerebral contusion ([place]) 07/17/2017   Traumatic pneumothorax 07/17/2017   Rib fractures 07/16/2017   Vitamin D deficiency 08/06/2015   Osteoporosis without current pathological fracture 08/06/2015    Heather Hodges, PT, DPT 12:12 PM  12/19/20    [facility] [address] [place], [place], [address] Phone: [phone]   Fax:  [phone]  Name: Heather Hodges MRN: [id number] Date  of Birth: 10/09/1945

## 2020-12-21 ENCOUNTER — Ambulatory Visit (INDEPENDENT_AMBULATORY_CARE_PROVIDER_SITE_OTHER): Payer: Medicare Other | Admitting: Physical Therapy

## 2020-12-21 ENCOUNTER — Other Ambulatory Visit: Payer: Self-pay

## 2020-12-21 ENCOUNTER — Encounter: Payer: Self-pay | Admitting: Physical Therapy

## 2020-12-21 DIAGNOSIS — M542 Cervicalgia: Secondary | ICD-10-CM

## 2020-12-21 DIAGNOSIS — M546 Pain in thoracic spine: Secondary | ICD-10-CM

## 2020-12-21 DIAGNOSIS — M62838 Other muscle spasm: Secondary | ICD-10-CM

## 2020-12-21 NOTE — Therapy (Signed)
[facility] [address] [place], [place], [address] Phone: [phone]   Fax:  [phone]  Physical Therapy Treatment  Patient Details  Name: Heather Hodges MRN: [id number] Date of Birth: [birth year] Referring Provider (PT): [name]   Encounter Date: 12/21/2020   PT End of Session - 12/21/20 1410     Visit Number 4    Number of Visits 12    Date for PT Re-Evaluation 01/07/21    Authorization Type Medicare    PT Start Time 1100    PT Stop Time 1140    PT Time Calculation (min) 40 min    Activity Tolerance Patient tolerated treatment well;Patient limited by pain    Behavior During Therapy [facility] for tasks assessed/performed             Past Medical History:  Diagnosis Date   Arthritis    Colon polyps    Diverticulosis    History of chicken pox    Hyperlipidemia    Hypothyroidism    Osteoporosis    Vitamin D deficiency     Past Surgical History:  Procedure Laterality Date   TONSILLECTOMY AND ADENOIDECTOMY      There were no vitals filed for this visit.   Subjective Assessment - 12/21/20 1409     Subjective Pt states she is feeling much better than a week or so ago. Did a lot of activity yesterday, and feels good today. Has been doing HEP. Muscles in neck/shoulders still tight/sore. She continues to have "sensation" at top of head, mild, 1/10, that she has continued to feel since her fall 3 years ago.    Currently in Pain? Yes    Pain Score 3     Pain Location Neck    Pain Orientation Right;Left    Pain Descriptors / Indicators Tightness    Pain Type Chronic pain    Pain Onset More than a month ago    Pain Frequency Intermittent                               [facility] Adult PT Treatment/Exercise - 12/21/20 0001       Exercises   Exercises Neck      Neck Exercises: Standing   Other Standing Exercises shoulder flexion and scaption AROM x 10 ea, with education and cuing to decrease  compensation in UTs.      Neck Exercises: Seated   Cervical Rotation 10 reps;Right;Left    Shoulder Rolls 15 reps    Other Seated Exercise scap retract x 15 in standing.    Other Seated Exercise Cervical nod x 10;      Neck Exercises: Stretches   Upper Trapezius Stretch --    Other Neck Stretches shoulder pulley, flexion and abduction x 20; cues to relax shoulders.      Manual Therapy   Manual Therapy Soft tissue mobilization;Passive ROM;Manual Traction    Soft tissue mobilization STM/ball roll, to bil upper traps, in seated position.                       PT Short Term Goals - 12/19/20 1206       PT SHORT TERM GOAL #1   Title Pt to be independent with initial HEP    Time 2    Period Weeks    Status Achieved    Target Date 12/10/20  PT Long Term Goals - 11/26/20 1213       PT LONG TERM GOAL #1   Title Pt to be independent with final HEP    Time 6    Period Weeks    Status New    Target Date 01/07/21      PT LONG TERM GOAL #2   Title Pt to report decreased soreness and tension in neck, to 0-2/10 with movment. 6    Time 6    Period Weeks    Status New    Target Date 01/07/21      PT LONG TERM GOAL #3   Title Pt to demo improved cervical rotation to at least 45 deg bilaterally, to improve ability for head turns and IADLS.    Time 6    Period Weeks    Status New    Target Date 01/07/21      PT LONG TERM GOAL #4   Title Pt to demo improved soft tissue restrictions in cervical and thoracic region to be [facility] for decreased pain.    Time 6    Period Weeks    Status New    Target Date 01/07/21      PT LONG TERM GOAL #5   Title Pt to demo ability for shoulder elevation and strengthening without pain in thoracic spine , to improve ability for IADLs.    Time 6    Period Weeks    Status New    Target Date 01/07/21                   Plan - 12/21/20 1411     Clinical Impression Statement Pt able to tolerate light STM/MFR  with ball in seated position today, to decrease muscle tension in UTs. She has much tension and tightness in cervical muscles that is likely causing soreness here. She has mild improvment of ability for rotation with less pain. She is imrpoving with ability for decreasing UT tightness with UE elevation and activity, but requires much cuing for this during session. Discussed other ways to lessen neck tension in seated position. Pt to benefit from continued care.    Personal Factors and Comorbidities Comorbidity 1;Time since onset of injury/illness/exacerbation    Examination-Activity Limitations Bend;Dressing;Lift;Locomotion Level;Reach Overhead    Stability/Clinical Decision Making Evolving/Moderate complexity    Rehab Potential Good    PT Frequency 2x / week    PT Duration 6 weeks    PT Treatment/Interventions ADLs/Self Care Home Management;Cryotherapy;Electrical Stimulation;Iontophoresis 4mg /ml Dexamethasone;Moist Heat;Traction;Therapeutic exercise;Therapeutic activities;Functional mobility training;DME Instruction;Neuromuscular re-education;Patient/family education;Manual techniques;Passive range of motion;Dry needling;Spinal Manipulations;Joint Manipulations;Taping;Vestibular;Gait training;Ultrasound    PT Home Exercise Plan [email or web address]    Consulted and Agree with Plan of Care Patient             Patient will benefit from skilled therapeutic intervention in order to improve the following deficits and impairments:  Hypomobility, Decreased activity tolerance, Decreased strength, Pain, Increased fascial restricitons, Decreased mobility, Increased muscle spasms, Decreased range of motion, Improper body mechanics, Impaired flexibility  Visit Diagnosis: Cervicalgia  Pain in thoracic spine  Other muscle spasm     Problem List Patient Active Problem List   Diagnosis Date Noted   Osteoporosis    Hypothyroidism    Hyperlipidemia    Diverticulosis    Arthritis    Chronic pain syndrome  02/03/2018   Contusion of left lung 07/20/2017   Cerebral contusion ([place]) 07/17/2017   Traumatic pneumothorax 07/17/2017   Rib fractures 07/16/2017   Vitamin  D deficiency 08/06/2015   Osteoporosis without current pathological fracture 08/06/2015    Lyndee Hensen, PT, DPT 2:15 PM  12/21/20    [facility] [place], [place], [address] Phone: [phone]   Fax:  [phone]  Name: Heather Hodges MRN: [id number] Date of Birth: September 12, 1945

## 2020-12-25 ENCOUNTER — Other Ambulatory Visit: Payer: Self-pay

## 2020-12-25 ENCOUNTER — Ambulatory Visit (INDEPENDENT_AMBULATORY_CARE_PROVIDER_SITE_OTHER): Payer: Medicare Other | Admitting: Physical Therapy

## 2020-12-25 ENCOUNTER — Encounter: Payer: Self-pay | Admitting: Physical Therapy

## 2020-12-25 DIAGNOSIS — M546 Pain in thoracic spine: Secondary | ICD-10-CM | POA: Diagnosis not present

## 2020-12-25 DIAGNOSIS — M62838 Other muscle spasm: Secondary | ICD-10-CM | POA: Diagnosis not present

## 2020-12-25 DIAGNOSIS — M542 Cervicalgia: Secondary | ICD-10-CM

## 2020-12-25 NOTE — Therapy (Addendum)
[facility] [address] [place], [place], [address] Phone: [phone]   Fax:  [phone]  Physical Therapy Treatment/Discharge  Patient Details  Name: Heather Hodges MRN: [id number] Date of Birth: 02/02/1946 Referring Provider (PT): [name]   Encounter Date: 12/25/2020   PT End of Session - 12/25/20 1159     Visit Number 5    Number of Visits 12    Date for PT Re-Evaluation 01/07/21    Authorization Type Medicare    PT Start Time 1102    PT Stop Time 1136    PT Time Calculation (min) 34 min    Activity Tolerance Patient tolerated treatment well;Patient limited by pain    Behavior During Therapy [facility] for tasks assessed/performed             Past Medical History:  Diagnosis Date   Arthritis    Colon polyps    Diverticulosis    History of chicken pox    Hyperlipidemia    Hypothyroidism    Osteoporosis    Vitamin D deficiency     Past Surgical History:  Procedure Laterality Date   TONSILLECTOMY AND ADENOIDECTOMY      There were no vitals filed for this visit.   Subjective Assessment - 12/25/20 1156     Subjective Pt states tightness in Bil UT region. She also feels "sensation" in top of head when she over does activity at home , she states has been her baseline    Currently in Pain? Yes    Pain Score 3     Pain Location Neck    Pain Orientation Right;Left    Pain Descriptors / Indicators Tightness    Pain Type Chronic pain    Pain Onset More than a month ago    Pain Frequency Intermittent                               [facility] Adult PT Treatment/Exercise - 12/25/20 0001       Self-Care   Self-Care Posture    Posture education and practice for optimal sitting posture with reading, and shoulder/UT posture with IADLs.      Exercises   Exercises Neck      Neck Exercises: Standing   Other Standing Exercises --      Neck Exercises: Seated   Cervical Rotation 10 reps;Right;Left     Shoulder Rolls 15 reps    Other Seated Exercise scap retract x 15 in standing.    Other Seated Exercise Cervical nod x 10;      Neck Exercises: Stretches   Upper Trapezius Stretch 2 reps;20 seconds;Right;Left    Other Neck Stretches shoulder pulley, flexion and abduction x 20; cues to relax shoulders.      Manual Therapy   Manual Therapy Soft tissue mobilization;Passive ROM;Manual Traction    Soft tissue mobilization STM/ball roll, to bil upper traps, in seated position.                     PT Education - 12/25/20 1158     Education Details Reviewed HEP in much detail, reviewed ways to relax UT/neck muscles, discussed posture, meditation, heat, and not doing exercises if anything is causing pain or discomfort in neck.    Person(s) Educated Patient    Methods Explanation;Demonstration;Verbal cues    Comprehension Verbalized understanding;Returned demonstration;Verbal cues required  PT Short Term Goals - 12/19/20 1206       PT SHORT TERM GOAL #1   Title Pt to be independent with initial HEP    Time 2    Period Weeks    Status Achieved    Target Date 12/10/20               PT Long Term Goals - 12/25/20 1200       PT LONG TERM GOAL #1   Title Pt to be independent with final HEP    Time 6    Period Weeks    Status Achieved      PT LONG TERM GOAL #2   Title Pt to report decreased soreness and tension in neck, to 0-2/10 with movment.    Time 6    Period Weeks    Status Partially Met      PT LONG TERM GOAL #3   Title Pt to demo improved cervical rotation to at least 45 deg bilaterally, to improve ability for head turns and IADLS.    Time 6    Period Weeks    Status Partially Met      PT LONG TERM GOAL #4   Title Pt to demo improved soft tissue restrictions in cervical and thoracic region to be [facility] for decreased pain.    Time 6    Period Weeks    Status Not Met      PT LONG TERM GOAL #5   Title Pt to demo ability for shoulder  elevation and strengthening without pain in thoracic spine , to improve ability for IADLs.    Time 6    Period Weeks    Status Achieved                   Plan - 12/25/20 1201     Clinical Impression Statement Pt has been doing well with HEP. She does requires review of this today, done in detail, for optimal mechanics. Overall goal is to decrease muscle tension in UT region. DIscussed other ways to do this, with posture, positioning, heat, meditation. She has mild limitation for cervical rotation, stiffness in C-spine, and has decreased ability for stretching for relaxing muscles.  She does continue to have muscle tension in bil UT, levator region, but has not not been tolerant of manual therapy to help this. At this time, pt is doing well with HEP, and will continue to perform, and will follow up with neurology in about 1 month, for other , continued symptoms of pain in head , dizziness/nausea with neck extension. Pt will be discharged today, pt in agreement with plan.    Personal Factors and Comorbidities Comorbidity 1;Time since onset of injury/illness/exacerbation    Examination-Activity Limitations Bend;Dressing;Lift;Locomotion Level;Reach Overhead    Stability/Clinical Decision Making Evolving/Moderate complexity    Rehab Potential Good    PT Frequency 2x / week    PT Duration 6 weeks    PT Treatment/Interventions ADLs/Self Care Home Management;Cryotherapy;Electrical Stimulation;Iontophoresis 55m/ml Dexamethasone;Moist Heat;Traction;Therapeutic exercise;Therapeutic activities;Functional mobility training;DME Instruction;Neuromuscular re-education;Patient/family education;Manual techniques;Passive range of motion;Dry needling;Spinal Manipulations;Joint Manipulations;Taping;Vestibular;Gait training;Ultrasound    PT Home Exercise Plan [email or web address]   Consulted and Agree with Plan of Care Patient             Patient will benefit from skilled therapeutic intervention in order to  improve the following deficits and impairments:  Hypomobility, Decreased activity tolerance, Decreased strength, Pain, Increased fascial restricitons, Decreased mobility, Increased muscle spasms, Decreased range of  motion, Improper body mechanics, Impaired flexibility  Visit Diagnosis: Cervicalgia  Pain in thoracic spine  Other muscle spasm     Problem List Patient Active Problem List   Diagnosis Date Noted   Osteoporosis    Hypothyroidism    Hyperlipidemia    Diverticulosis    Arthritis    Chronic pain syndrome 02/03/2018   Contusion of left lung 07/20/2017   Cerebral contusion ([place]) 07/17/2017   Traumatic pneumothorax 07/17/2017   Rib fractures 07/16/2017   Vitamin D deficiency 08/06/2015   Osteoporosis without current pathological fracture 08/06/2015   Lyndee Hensen, PT, DPT 12:11 PM  12/25/20    [facility] [address] [place], [place], [address] Phone: [phone]   Fax:  [phone]  Name: Keisa Blow MRN: [id number] Date of Birth: 08/24/1945  PHYSICAL THERAPY DISCHARGE SUMMARY  Visits from Start of Care: 5 Plan: Patient agrees to discharge.  Patient goals were partially  met. Patient is being discharged .

## 2020-12-25 NOTE — Patient Instructions (Signed)
Access Code: [email or web address] URL: [email or web address] Date: 12/25/2020 Prepared by: Lyndee Hensen  Exercises Supine Shoulder Flexion AAROM with [place] - 1 x daily - 1 sets - 10 reps - 10 hold Seated Shoulder Rolls - 2 x daily - 1 sets - 10 reps Seated Scapular Retraction - 1 x daily - 1 sets - 10 reps Seated Cervical Sidebending Stretch - 2 x daily - 3 reps - 30 hold Seated Cervical Rotation AROM - 2 x daily - 1 sets - 10 reps - 3 hold

## 2020-12-27 ENCOUNTER — Encounter: Payer: Medicare Other | Admitting: Physical Therapy

## 2020-12-28 ENCOUNTER — Ambulatory Visit: Payer: Medicare Other | Admitting: Sports Medicine

## 2021-01-01 ENCOUNTER — Ambulatory Visit (INDEPENDENT_AMBULATORY_CARE_PROVIDER_SITE_OTHER): Payer: Medicare Other | Admitting: Dermatology

## 2021-01-01 ENCOUNTER — Encounter: Payer: Self-pay | Admitting: Dermatology

## 2021-01-01 ENCOUNTER — Other Ambulatory Visit: Payer: Self-pay

## 2021-01-01 DIAGNOSIS — L821 Other seborrheic keratosis: Secondary | ICD-10-CM

## 2021-01-01 DIAGNOSIS — D2239 Melanocytic nevi of other parts of face: Secondary | ICD-10-CM | POA: Diagnosis not present

## 2021-01-01 DIAGNOSIS — Z1283 Encounter for screening for malignant neoplasm of skin: Secondary | ICD-10-CM

## 2021-01-01 DIAGNOSIS — L738 Other specified follicular disorders: Secondary | ICD-10-CM

## 2021-01-01 DIAGNOSIS — D229 Melanocytic nevi, unspecified: Secondary | ICD-10-CM

## 2021-01-20 ENCOUNTER — Encounter: Payer: Self-pay | Admitting: Dermatology

## 2021-01-20 NOTE — Progress Notes (Signed)
   Follow-Up Visit   Subjective  Heather Hodges is a 75 y.o. female who presents for the following: Annual Exam (Spot on back- white dot & scaly spots on face/hairline).  General skin examination, several spots to check Location:  Duration:  Quality:  Associated Signs/Symptoms: Modifying Factors:  Severity:  Timing: Context:   Objective  Well appearing patient in no apparent distress; mood and affect are within normal limits. Head - Anterior (Face), Left Forearm - Posterior, Mid Back Flattopped tan 2 to 5 mm textured papules  Right Malar Cheek Cream-colored 2 mm eccentrically delled papule  Mid Tip of Nose 2 mm flesh-colored domed papule, historically stable.  Mid Back No atypical pigmented lesions.  Mid Back All sun exposed areas plus legs, back, upper chest examined: No sign of skin cancer    All sun exposed areas plus back examined.   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  The documentation on 01/20/21 for the exam, diagnosis, procedures, and orders are all accurate and complete.

## 2021-01-23 ENCOUNTER — Encounter: Payer: Medicare Other | Admitting: Physical Therapy

## 2021-01-24 ENCOUNTER — Ambulatory Visit: Payer: Medicare Other | Admitting: Family Medicine

## 2021-01-28 NOTE — Progress Notes (Signed)
NEUROLOGY CONSULTATION NOTE  Heather Hodges MRN: [id number] DOB: 1945/09/03  Referring provider: Inda Coke, PA-C Primary care provider: Inda Coke, PA-C  Reason for consult:  intermittent dizziness  Assessment/Plan:   Chronic neck pain multifactorial secondary to spondylosis and myofascial pain Cervicogenic dizziness  I feel that patient would best continue to be treated by Sports Medicine.  In meantime, I suggested titrating gabapentin up as needed/tolerated to 200mg  three times daily.  Under guidance of her providers, she can continue titrating dose from there.  If she fails this, then I would recommend referral to spine specialist or pain specialist.   Subjective:  Heather Hodges is a 75 year old female with hypothyroidism, HLD, arthritis and osteoporosis who presents for intermittent dizziness.  History supplemented by referring provider's note.  Patient sustained right hemorrhagic contusion in April 2019 following a fall off of a ladder.  She also sustained  thoracic vertebral body fractures as well as a pneumothorax.  Since then, she has suffered from chronic neck and back pain.  She has significant tightness and pain particularly involving the right side of her neck, radiating to back of head.  She has tenderness of scalp to touch.  She has been on gabapentin for some time.  She was seen by Dr. Georgina Snell of Sports Medicine for her pain.  She has chronic headache and neck pain.  Cervical X-ray on 12/05/2020 showed mild degenerative disc disease at C4-5 through C6-7 and multilevel facet hypertrophy. She was referred to physical therapy.  When the therapist was massaging her neck by gently flexing her neck while laying supine, she developed dizziness, described as a lightheadedness.  It happened again at physical therapy the following week.  It was also noted to occur when turning head to the right.  She was found at one point to be orthostatic.  MRI of brain without contrast on  12/12/2020 showed no acute abnormality but demonstrated chronic blood products along the right central sulcus consistent with remote subarachnoid hemorrhage, possibly progressed compared to imaging in 2019 vs different acquisition technique.  MRA of head and neck on 12/12/2020 personally reviewed was negative.  Follow up MRI of brain with contrast on 12/14/2020 personally reviewed was negative for underlying vascular malformation.    PAST MEDICAL HISTORY: Past Medical History:  Diagnosis Date   Arthritis    Colon polyps    Diverticulosis    History of chicken pox    Hyperlipidemia    Hypothyroidism    Osteoporosis    Vitamin D deficiency     PAST SURGICAL HISTORY: Past Surgical History:  Procedure Laterality Date   TONSILLECTOMY AND ADENOIDECTOMY      MEDICATIONS: Current Outpatient Medications on File Prior to Visit  Medication Sig Dispense Refill   atorvastatin (LIPITOR) 40 MG tablet Take 1 tablet (40 mg total) by mouth daily. 90 tablet 3   b complex vitamins capsule Take 1 capsule by mouth every other day.     Calcium Carbonate-Vitamin D (CALCIUM 500+D PO) Take 2 each by mouth every other day.     gabapentin (NEURONTIN) 100 MG capsule Take 100 mg by mouth 3 (three) times daily.     ketoconazole (NIZORAL) 2 % cream Apply to affected area 1-2 times daily 15 g 0   levothyroxine (SYNTHROID) 75 MCG tablet Take 1 tablet (75 mcg total) by mouth every other day. 90 tablet 1   levothyroxine (SYNTHROID) 88 MCG tablet Take 1 tablet (88 mcg total) by mouth every other day. 90 tablet 1  lidocaine (XYLOCAINE) 4 % external solution Apply topically.     Lidocaine 4 % PTCH Place onto the skin.     meloxicam (MOBIC) 7.5 MG tablet Take 1 tablet (7.5 mg total) by mouth daily. 90 tablet 3   Tart Cherry 1200 MG CAPS Take 1 capsule by mouth daily in the afternoon.     traMADol (ULTRAM) 50 MG tablet Take 50 mg by mouth 2 (two) times daily.     No current facility-administered medications on file prior  to visit.    ALLERGIES: Allergies  Allergen Reactions   Risedronate Sodium Other (See Comments)    TMJ dysfunction    FAMILY HISTORY: Family History  Problem Relation Age of Onset   Arthritis Mother    Breast cancer Mother    High Cholesterol Mother    Heart attack Father    High Cholesterol Father    Arthritis Brother    Hearing loss Brother    Hypercholesterolemia Brother     Objective:  Blood pressure 131/79, pulse 79, height 5' (1.524 m), weight 131 lb 12.8 oz (59.8 kg), SpO2 94 %. General: No acute distress.  Patient appears well-groomed.   Head:  Normocephalic/atraumatic Eyes:  fundi examined but not visualized Neck: supple, right-sided paraspinal tenderness, reduced range of motion in all directions Back: No paraspinal tenderness Heart: regular rate and rhythm Lungs: Clear to auscultation bilaterally. Vascular: No carotid bruits. Neurological Exam: Mental status: alert and oriented to person, place, and time, recent and remote memory intact, fund of knowledge intact, attention and concentration intact, speech fluent and not dysarthric, language intact. Cranial nerves: CN I: not tested CN II: pupils equal, round and reactive to light, visual fields intact CN III, IV, VI:  full range of motion, no nystagmus, no ptosis CN V: facial sensation intact. CN VII: upper and lower face symmetric CN VIII: hearing intact CN IX, X: gag intact, uvula midline CN XI: sternocleidomastoid and trapezius muscles intact CN XII: tongue midline Bulk & Tone: normal, no fasciculations. Motor:  muscle strength 5/5 throughout Sensation:  Pinprick, temperature and vibratory sensation intact. Deep Tendon Reflexes:  2+ throughout,  toes downgoing.   Finger to nose testing:  Without dysmetria.   Heel to shin:  Without dysmetria.   Gait:  Normal station and stride.  Romberg negative.    Thank you for allowing me to take part in the care of this patient.  Metta Clines, DO  CC: Inda Coke, PA-C

## 2021-01-29 ENCOUNTER — Encounter: Payer: Self-pay | Admitting: Neurology

## 2021-01-29 ENCOUNTER — Ambulatory Visit (INDEPENDENT_AMBULATORY_CARE_PROVIDER_SITE_OTHER): Payer: Medicare Other | Admitting: Neurology

## 2021-01-29 ENCOUNTER — Other Ambulatory Visit: Payer: Self-pay

## 2021-01-29 VITALS — BP 131/79 | HR 79 | Ht 60.0 in | Wt 131.8 lb

## 2021-01-29 DIAGNOSIS — R42 Dizziness and giddiness: Secondary | ICD-10-CM | POA: Diagnosis not present

## 2021-01-29 DIAGNOSIS — M542 Cervicalgia: Secondary | ICD-10-CM | POA: Diagnosis not present

## 2021-01-29 NOTE — Patient Instructions (Signed)
I would recommend following up with Dr. Georgina Snell In meantime, consider titrating up on the gabapentin as needed/tolerated:  - take 1 pill in  morning, 1 pill afternoon and 2 pills at night for one week  - then 2 pills in morning, 1 pill afternoon and 2 pills at night for one week  - then 2 pills three times daily  You can go up from there as needed/tolerated 3. If gabapentin ineffective, consider seeing spine specialist

## 2021-01-31 ENCOUNTER — Ambulatory Visit
Admission: RE | Admit: 2021-01-31 | Discharge: 2021-01-31 | Disposition: A | Discharge status: Home / Self Care | Payer: Medicare Other | Source: Ambulatory Visit | Attending: Physician Assistant | Admitting: Physician Assistant

## 2021-01-31 ENCOUNTER — Other Ambulatory Visit: Payer: Self-pay

## 2021-01-31 DIAGNOSIS — Z1231 Encounter for screening mammogram for malignant neoplasm of breast: Secondary | ICD-10-CM

## 2021-01-31 IMAGING — MG MM DIGITAL SCREENING BILAT W/ TOMO AND CAD
6 of 10 series · 6 of 30 positions shown · non-contrast
Comparison: Previous exam(s).

CLINICAL DATA: Screening.

EXAM:
DIGITAL SCREENING BILATERAL MAMMOGRAM WITH TOMOSYNTHESIS AND CAD
TECHNIQUE: Bilateral screening digital craniocaudal and mediolateral oblique
mammograms were obtained. Bilateral screening digital breast
tomosynthesis was performed. The images were evaluated with
computer-aided detection.

[R CC synth-2D (1 of 2)]
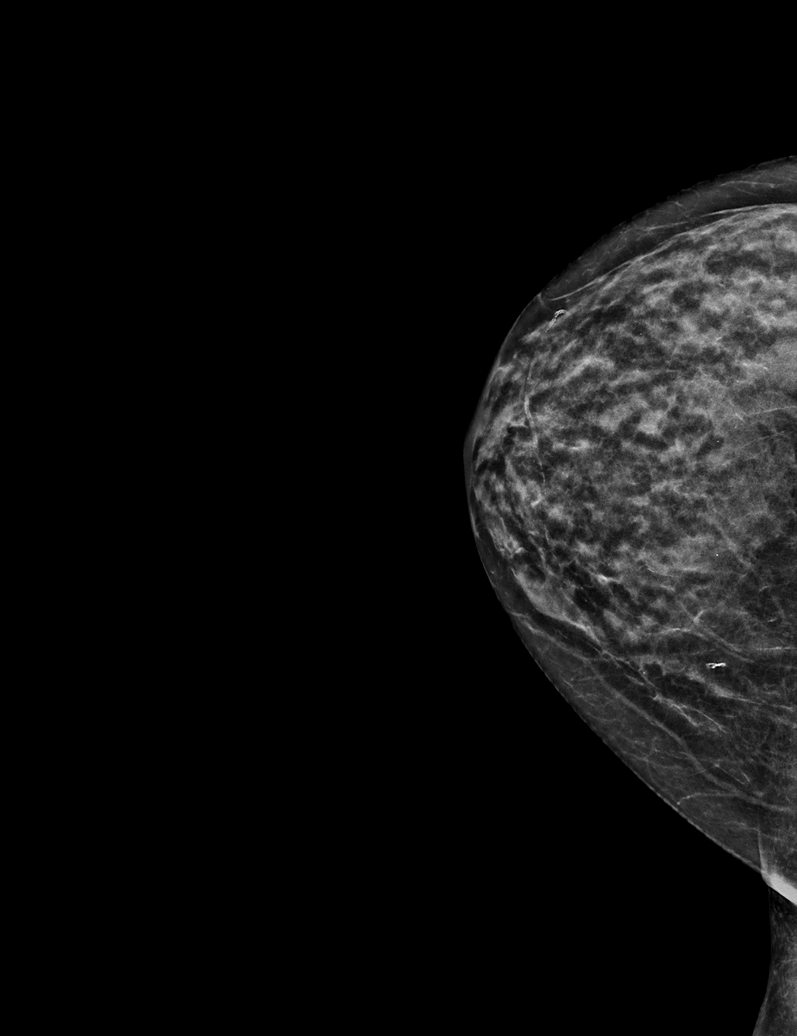

[L CC synth-2D]
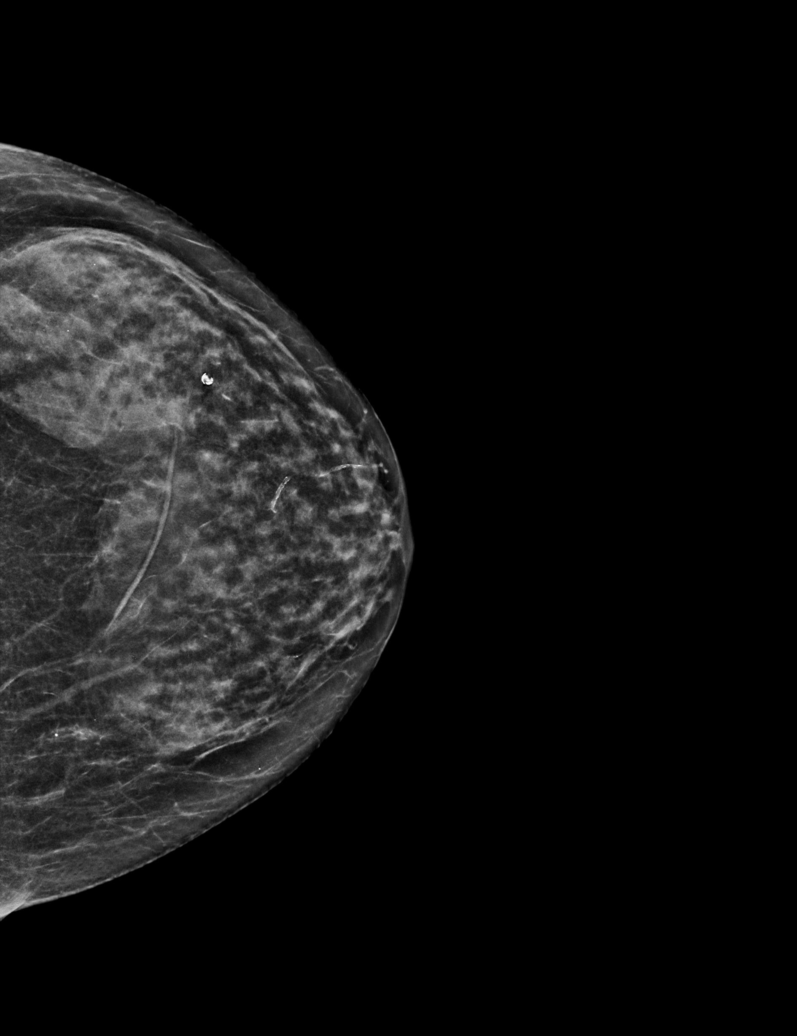

[R CC synth-2D (2 of 2)]
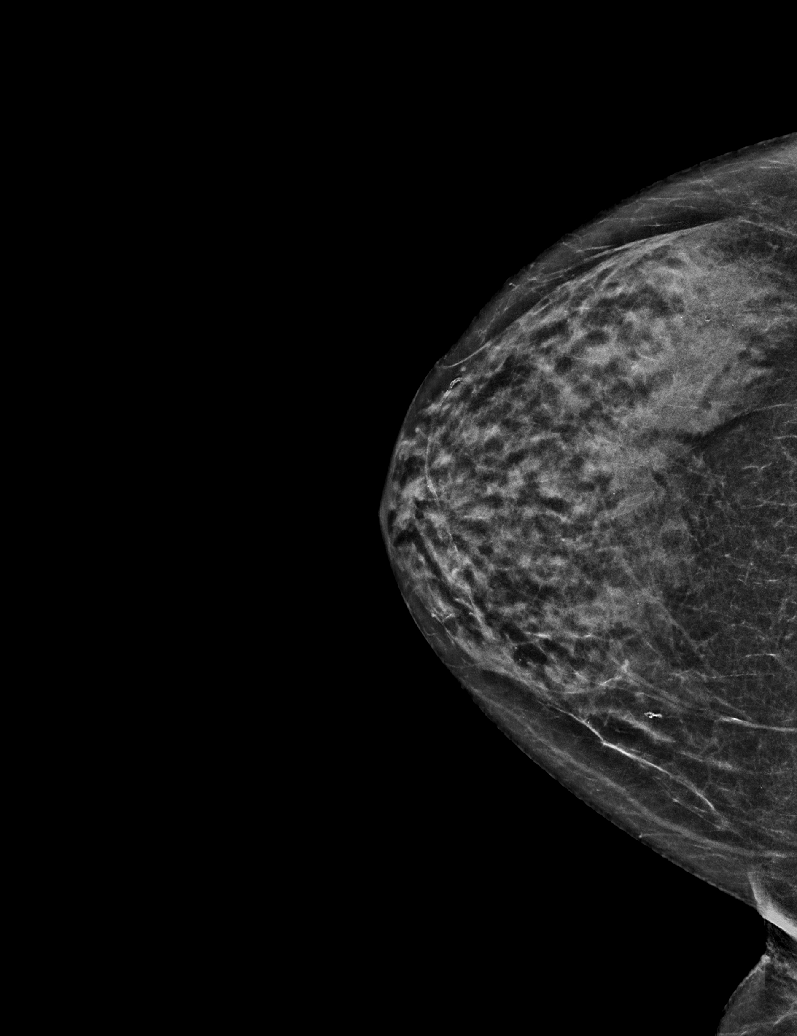

[R MLO synth-2D]
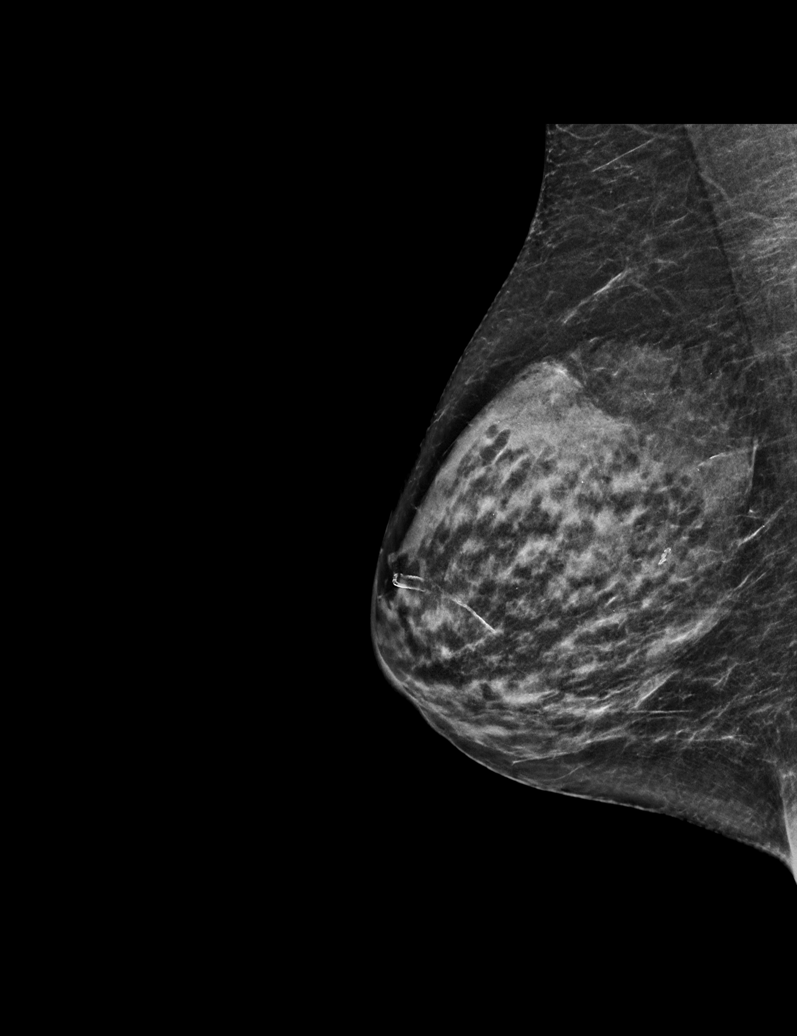

[L MLO synth-2D]
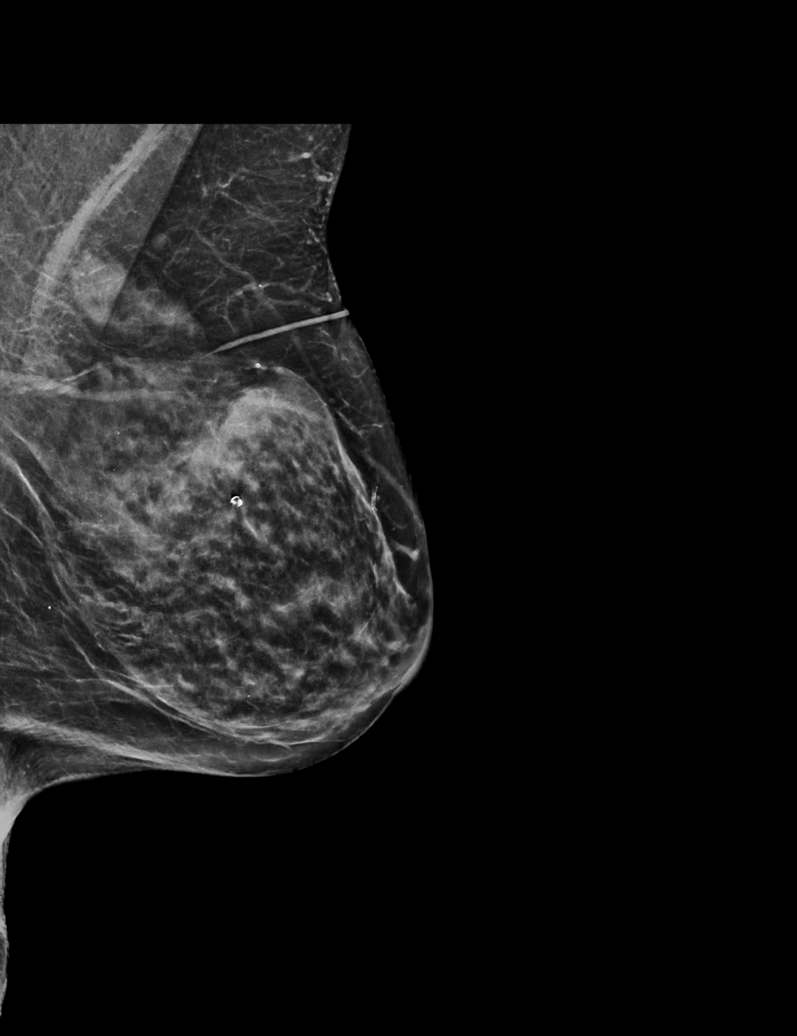

[R MLO tomo · tomo slice 25/50.0]
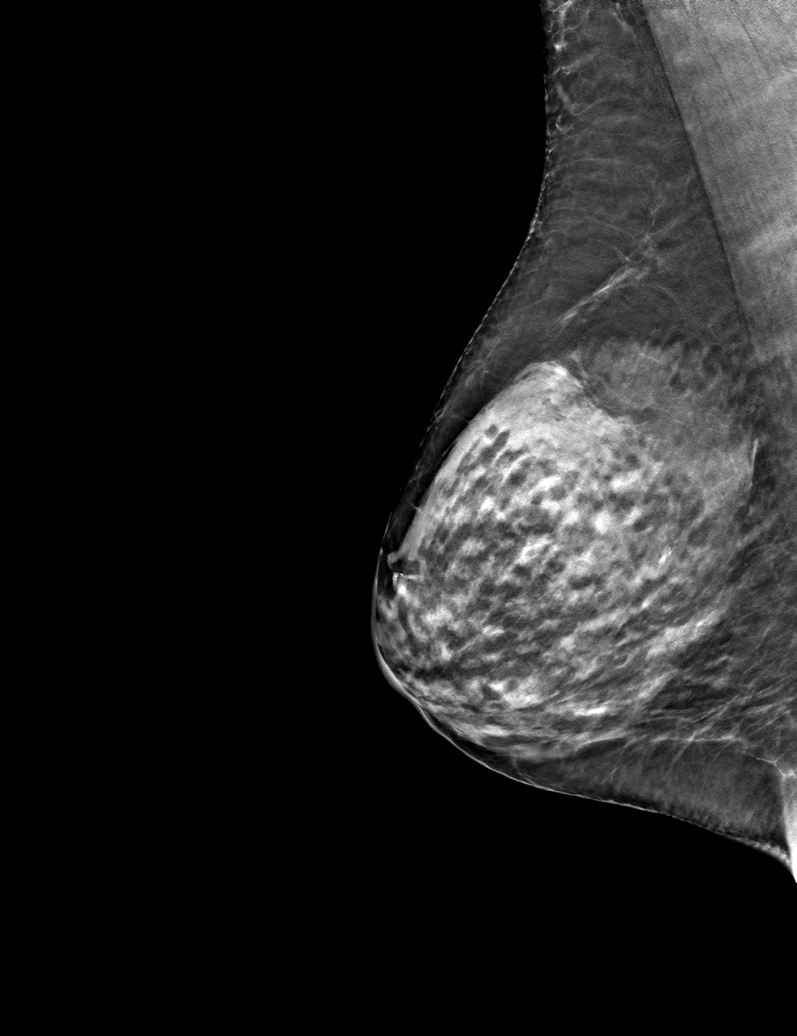

[6 of 30 positions shown; findings below may reference images not displayed]

ACR Breast Density Category c: The breast tissue is heterogeneously
dense, which may obscure small masses.
FINDINGS: There are no findings suspicious for malignancy.
IMPRESSION: No mammographic evidence of malignancy. A result letter of this
screening mammogram will be mailed directly to the patient.

RECOMMENDATION:
Screening mammogram in one year. (Code:[V2])

BI-RADS CATEGORY  1: Negative.

## 2021-02-11 ENCOUNTER — Other Ambulatory Visit: Payer: Self-pay

## 2021-02-11 ENCOUNTER — Encounter: Payer: Self-pay | Admitting: Family Medicine

## 2021-02-11 ENCOUNTER — Ambulatory Visit (INDEPENDENT_AMBULATORY_CARE_PROVIDER_SITE_OTHER): Payer: Medicare Other | Admitting: Family Medicine

## 2021-02-11 VITALS — BP 128/72 | HR 76 | Ht 60.0 in | Wt 132.8 lb

## 2021-02-11 DIAGNOSIS — G8929 Other chronic pain: Secondary | ICD-10-CM | POA: Diagnosis not present

## 2021-02-11 DIAGNOSIS — M542 Cervicalgia: Secondary | ICD-10-CM

## 2021-02-11 DIAGNOSIS — M797 Fibromyalgia: Secondary | ICD-10-CM

## 2021-02-11 MED ORDER — DULOXETINE HCL 20 MG PO CPEP
20.0000 mg | ORAL_CAPSULE | Freq: Every day | ORAL | 1 refills | Status: DC
Start: 2021-02-11 — End: 2021-04-10

## 2021-02-11 NOTE — Progress Notes (Signed)
I, Heather Hodges, LAT, ATC, am serving as scribe for Dr. Lynne Hodges.  Heather Hodges is a 75 y.o. female who presents to [place] at [facility] today for f/u of chronic neck pain and headaches/dizziness thought to be due to occipital neuralgia.  Her hx is significant for right hemorrhagic contusion in April 2019 following a fall off of a ladder.  She also sustained  thoracic vertebral body fractures as well as a pneumothorax.  She was last seen by Heather Hodges on 12/05/20 and then by Heather Hodges on 12/10/20.  She has been referred to PT and completed 5 sessions and has been d/c.  She also c/o pain in multiple locations in her body thought to be concerning for possible fibromyalgia.  She was informed about cymbalta as a possible medication treatment and provided w/ information about this medication.  Since her last visit w/ Heather Hodges, the pt has been seen by Heather Hodges w/ Neurology.  Today, pt reports that her symptoms are worse due to all the tests she's had recently.  She also reports chest and mid-back pain after having her mammogram.  She is also c/o R shoulder pain.  She is finished w/ PT and has been provided a HEP.    Diagnostic imaging: MRI brain w/ contrast- 12/14/20; MRI brain w/o contrast- 12/12/20; Neck and head angiogram - 12/12/20; Neck XR- 12/05/20; R ankle, R knee, R hip, L-spine and T-spine XR- 11/14/20  Pertinent review of systems: No fevers or chills  Relevant historical information: Osteoporosis.  Chronic pain syndrome.   Exam:  BP 128/72 (BP Location: Right Arm, Patient Position: Sitting, Cuff Size: Normal)   Pulse 76   Ht 5' (1.524 m)   Wt 132 lb 12.8 oz (60.2 kg)   SpO2 94%   BMI 25.94 kg/m  General: Well Developed, well nourished, and in no acute distress.   MSK: C-spine normal-appearing decreased cervical motion. Point tender multiple muscle groups upper and lower extremities.    Lab and Radiology Results EXAM: CERVICAL SPINE - 2-3 VIEW   COMPARISON:   Cervical spine radiograph 06/28/2019   FINDINGS: Normal alignment. No listhesis. Mild disc space narrowing and endplate spurring R3-U0 through C6-C7 with anterior spurring. Prominent multilevel facet hypertrophy. Lateral masses of C1 are well aligned on C2. there is no evidence of fracture, focal bone abnormality or bone destruction. No prevertebral soft tissue edema.   IMPRESSION: 1. 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 2. Prominent multilevel facet hypertrophy. 3. Stable radiographic appearance of degenerative change from March of 2021.     Electronically Signed   By: Heather Hodges M.D.   On: 12/05/2020 23:41 I, Heather Hodges, personally (independently) visualized and performed the interpretation of the images attached in this note.      Assessment and Plan: 75 y.o. female with chronic neck pain thought to be due to degenerative changes especially at the facet joints.  Pain is worse on the right but is bilateral.  At this point she has failed conservative management strategies including trial of physical therapy certainly greater than 6 weeks.  Plan for MRI to evaluate facet joints and for potential facet injection planning.  Very likely after the results of the MRI will refer to [facility] for facet injection planning  Additionally she has more generalized chronic pain thought to be fibromyalgia type.  Discussed treatment plan and options.  Again failing conservative management strategies including physical therapy.  We discussed Cymbalta in the past.  Plan  to start low-dose Cymbalta 20 mg daily and reassess in a month.  We will titrate upwards a little bit.  Currently also taking gabapentin.  Patient is tapering off of tramadol and currently.   PDMP not reviewed this encounter. Orders Placed This Encounter  Procedures   MR CERVICAL SPINE WO CONTRAST    Further evaluate cause of cervical pain    Standing Status:   Future    Standing Expiration Date:   02/11/2022     Order Specific Question:   What is the patient's sedation requirement?    Answer:   No Sedation    Order Specific Question:   Does the patient have a pacemaker or implanted devices?    Answer:   No    Order Specific Question:   Preferred imaging location?    Answer:   Product/process development scientist (table limit-350lbs)   Meds ordered this encounter  Medications   DULoxetine (CYMBALTA) 20 MG capsule    Sig: Take 1 capsule (20 mg total) by mouth daily.    Dispense:  30 capsule    Refill:  1     Discussed warning signs or symptoms. Please see discharge instructions. Patient expresses understanding.   The above documentation has been reviewed and is accurate and complete Heather Hodges, M.D.   Total encounter time 30 minutes including face-to-face time with the patient and, reviewing past medical record, and charting on the date of service.   Treatment plan and options.  Also reviewed imaging tests and showed pictures of her x-ray and MR arteriogram

## 2021-02-11 NOTE — Patient Instructions (Addendum)
Good to see you today.  Please call [facility] to schedule your MRI, [phone]   I sent the Cymbalta to your pharmacy  Follow-up after MRI

## 2021-02-14 ENCOUNTER — Encounter: Payer: Medicare Other | Admitting: Physical Therapy

## 2021-02-16 ENCOUNTER — Other Ambulatory Visit: Payer: Self-pay

## 2021-02-16 ENCOUNTER — Ambulatory Visit (INDEPENDENT_AMBULATORY_CARE_PROVIDER_SITE_OTHER): Payer: Medicare Other

## 2021-02-16 DIAGNOSIS — G8929 Other chronic pain: Secondary | ICD-10-CM

## 2021-02-16 DIAGNOSIS — M542 Cervicalgia: Secondary | ICD-10-CM | POA: Diagnosis not present

## 2021-02-16 IMAGING — MR MR CERVICAL SPINE W/O CM
5 series · 38 of 48 positions shown · non-contrast
Comparison: Cervical spine radiographs [DATE]

CLINICAL DATA: Cervical radiculopathy. Difficulty turning head to
side.

EXAM:
MRI CERVICAL SPINE WITHOUT CONTRAST
TECHNIQUE: Multiplanar, multisequence MR imaging of the cervical spine was
performed. No intravenous contrast was administered.

[Series 3: T2 · sagittal · 3.0mm · 0.69mm/px · 6 of 13 slices shown (1 of 2)]
[im 1/13]
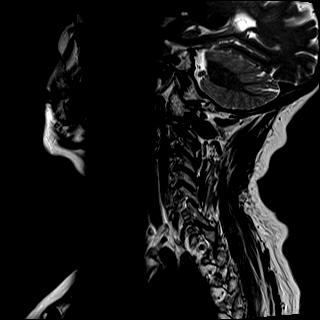
[im 3/13]
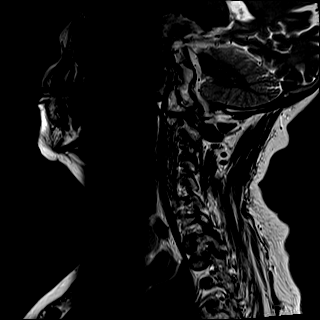
[im 5/13]
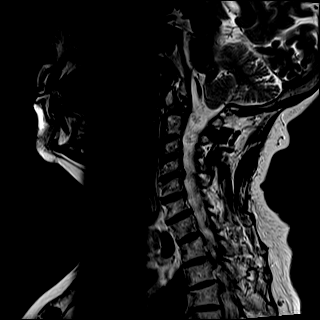
[im 8/13]
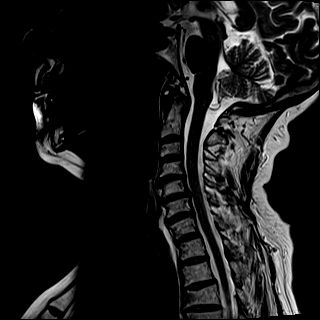
[im 10/13]
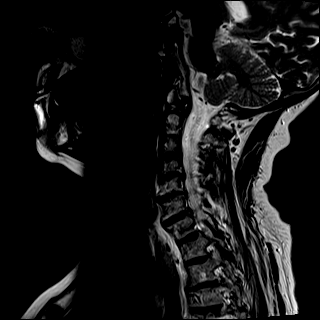
[im 13/13]
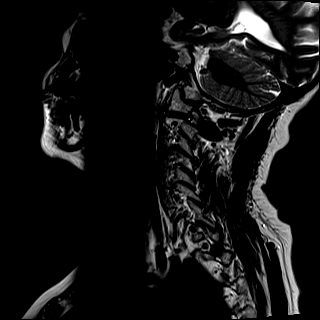

[Series 4: T1 · sagittal · 3.0mm · 0.86mm/px · 6 of 13 slices shown]
[im 1/13]
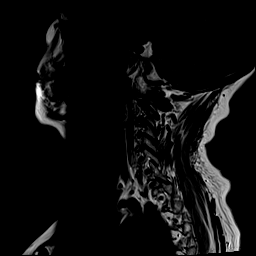
[im 3/13]
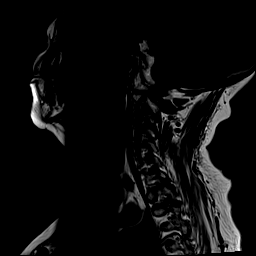
[im 5/13]
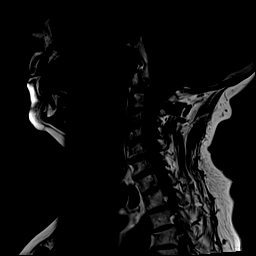
[im 8/13]
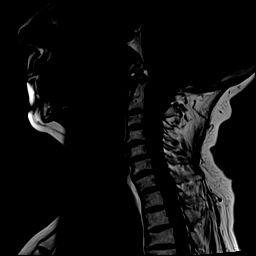
[im 10/13]
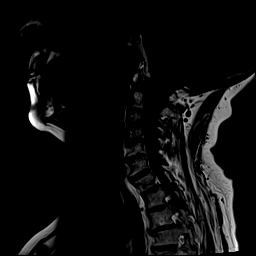
[im 13/13]
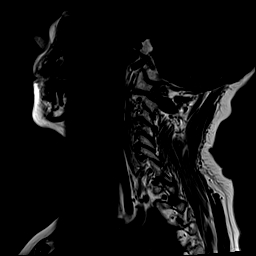

[Series 5: STIR · sagittal · 3.0mm · 0.69mm/px · 6 of 13 slices shown]
[im 1/13]
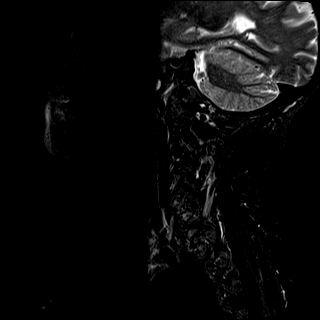
[im 3/13]
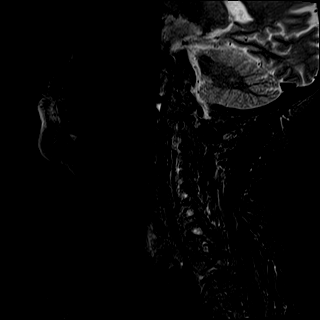
[im 5/13]
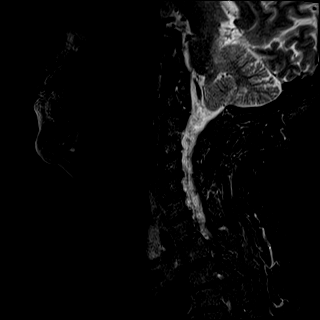
[im 8/13]
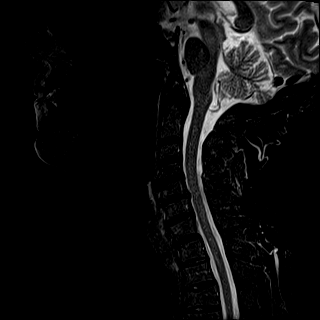
[im 10/13]
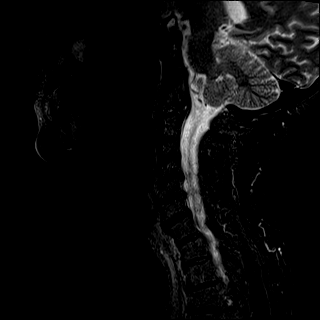
[im 13/13]
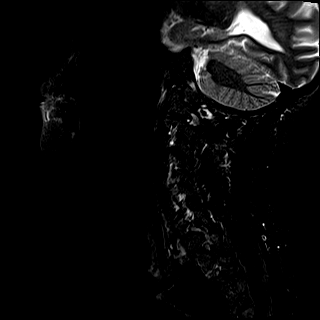

[Series 6: T2 · axial · 3.0mm · 0.70mm/px · z∈[-63,+29]mm · 12 of 32 slices shown (2 of 2)]
[im 1/32]
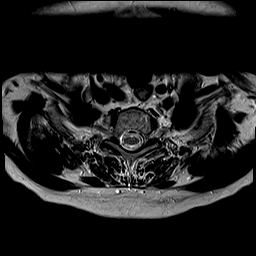
[im 3/32]
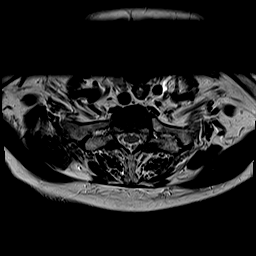
[im 5/32]
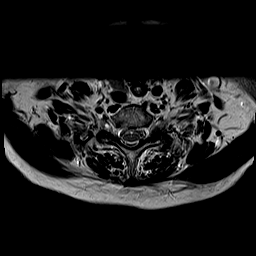
[im 7/32]
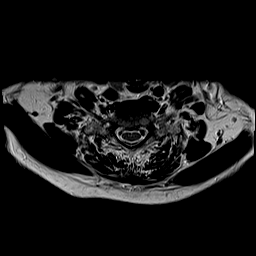
[im 9/32]
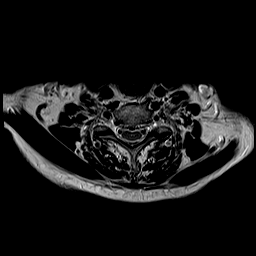
[im 12/32]
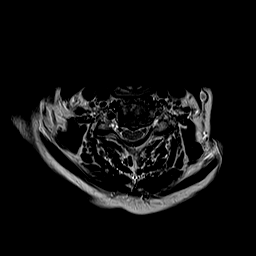
[im 14/32]
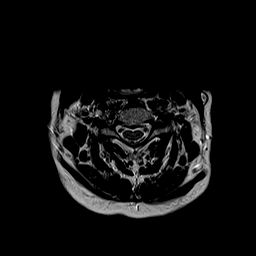
[im 16/32]
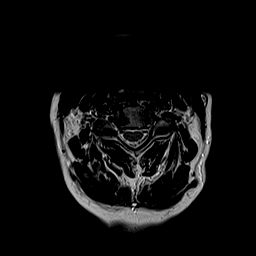
[im 18/32]
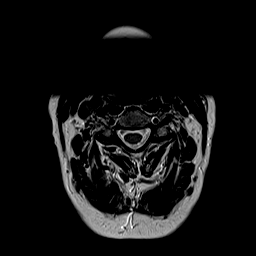
[im 23/32]
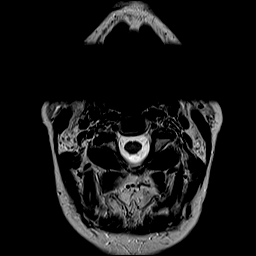
[im 27/32]
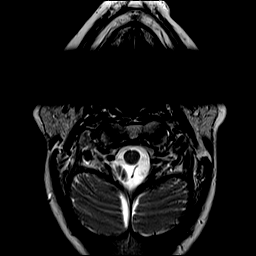
[im 32/32]
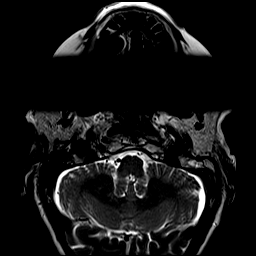

[Series 7: mpgr ax · axial · 3.0mm · 0.35mm/px · z∈[-63,+29]mm · 8 of 32 slices shown]
[im 1/32]
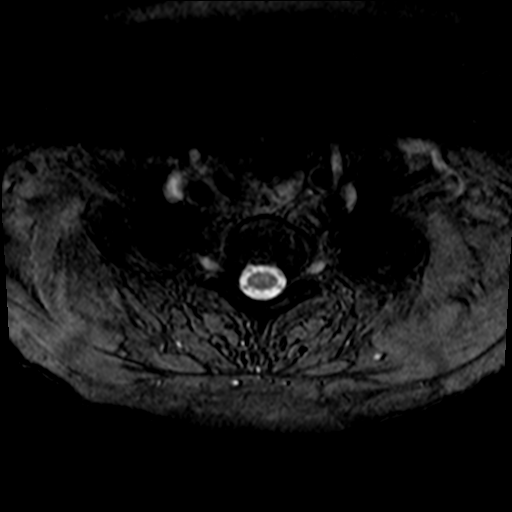
[im 5/32]
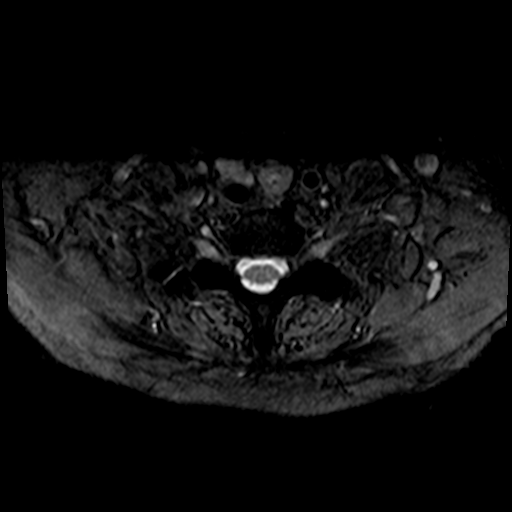
[im 9/32]
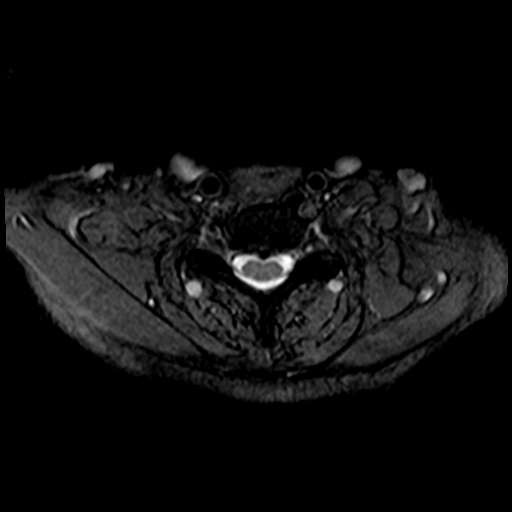
[im 14/32]
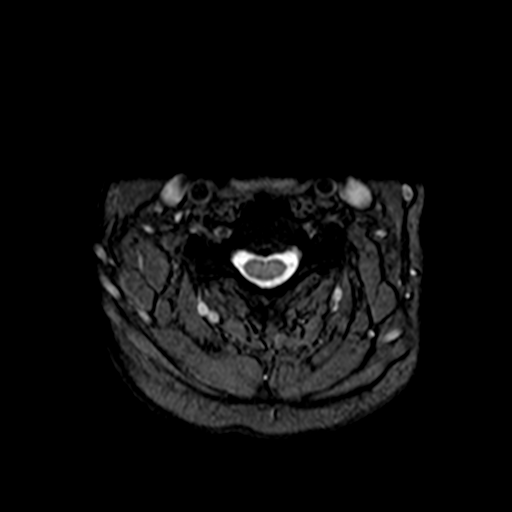
[im 18/32]
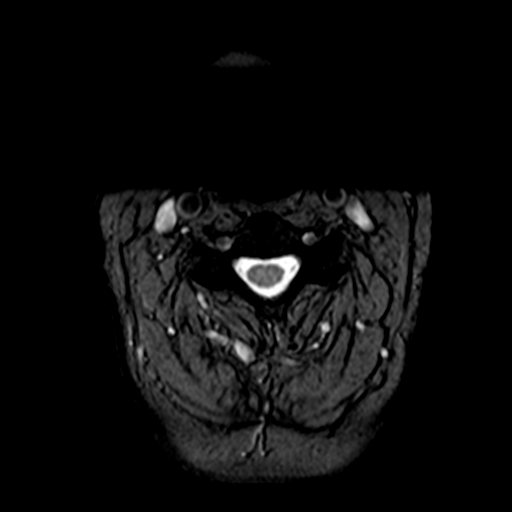
[im 23/32]
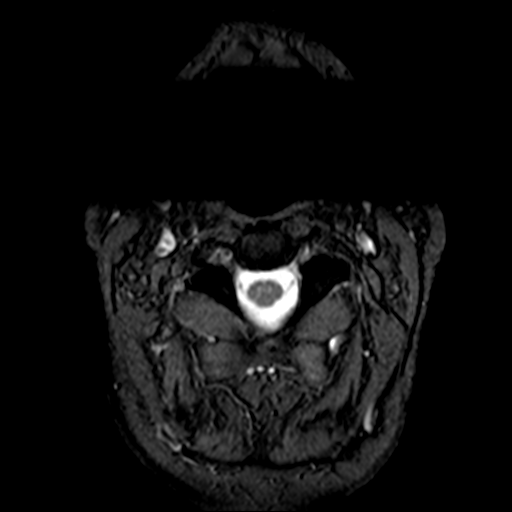
[im 27/32]
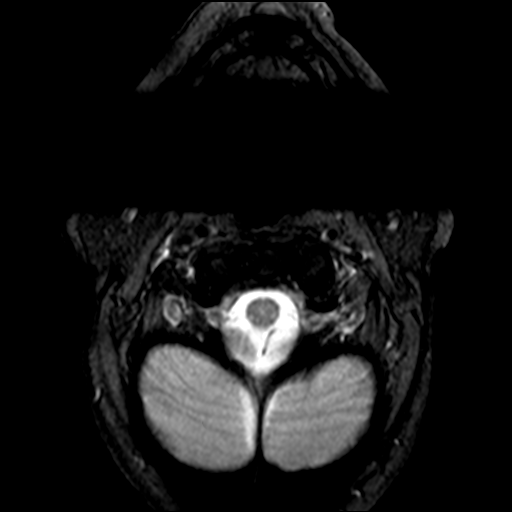
[im 32/32]
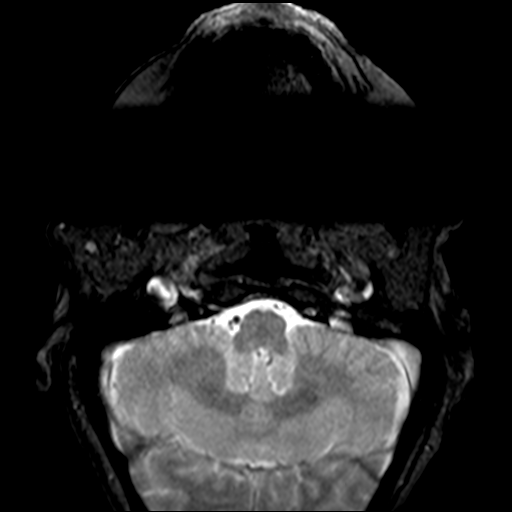

[38 of 48 positions shown; findings below may reference images not displayed]

FINDINGS: Alignment: Insert minimal anterolisthesis is present at C7-T1. No
significant listhesis is present in the cervical spine otherwise.
Mild straightening of the normal cervical lordosis is stable.

Vertebrae: Chronic endplate marrow changes again noted at C4-5, C5-6
and C6-7.

Cord: Normal signal and morphology.

Posterior Fossa, vertebral arteries, paraspinal tissues:
Craniocervical junction is normal. Flow is present in the vertebral
arteries bilaterally. Visualized intracranial contents are normal.

Disc levels:

C2-3: Asymmetric right-sided facet hypertrophy results in mild right
foraminal narrowing. The central canal is patent.

C3-4: Mild facet hypertrophy present bilaterally. Mild bilateral
foraminal narrowing noted. The central canal is patent.

C4-5: Uncovertebral spurring is worse on the left. Moderate
foraminal narrowing is worse left than right. Partial effacement of
ventral CSF is noted.

C5-6: A central disc protrusion effaces the ventral CSF and slightly
distorts the ventral surface cord. No abnormal signal is present.
Moderate left foraminal stenosis is present. The right foramen is
patent.

C6-7: Mild left-sided uncovertebral spurring and foraminal narrowing
is present. A shallow central disc protrusion is present.

C7-T1: Moderate facet hypertrophy is worse left than right. No
significant stenosis is present.
IMPRESSION: 1. Multilevel spondylosis of the cervical spine as described.
2. Mild right foraminal narrowing at C2-3 due to facet spurring.
3. Mild bilateral foraminal narrowing at C3-4 due to facet spurring.
4. Moderate foraminal narrowing bilaterally at C4-5 is worse on the
left secondary to uncovertebral disease. Mild central canal stenosis
is present.
5. Moderate left foraminal stenosis at C5-6.
6. Moderate central canal stenosis at C5-6 with contact and
distortion of the ventral surface of the cord.
7. Mild left-sided uncovertebral spurring and foraminal narrowing at
C6-7.

## 2021-02-18 NOTE — Progress Notes (Signed)
MRI cervical spine shows multilevel arthritis changes and areas where the nerve and spinal cord could get pinched. Next step probably should be injection in your neck and based on our discussion in clinic probably should refer to physical medicine and rehab for injection. Would you like me to do the referral now?

## 2021-02-19 ENCOUNTER — Telehealth: Payer: Self-pay | Admitting: Physical Therapy

## 2021-02-19 DIAGNOSIS — G8929 Other chronic pain: Secondary | ICD-10-CM

## 2021-02-19 NOTE — Addendum Note (Signed)
Addended by: Gregor Hams on: 02/19/2021 12:52 PM   Modules accepted: Orders

## 2021-02-19 NOTE — Telephone Encounter (Signed)
Referral placed to Dr. Ernestina Patches at [facility].  You should hear from his office soon.

## 2021-02-19 NOTE — Telephone Encounter (Signed)
Pt called regarding her MRI results and states that she would like to proceed w/ referral to [facility] for possible injections.  Please advise.

## 2021-02-26 ENCOUNTER — Encounter: Payer: Self-pay | Admitting: Physical Medicine and Rehabilitation

## 2021-02-26 ENCOUNTER — Other Ambulatory Visit: Payer: Self-pay

## 2021-02-26 ENCOUNTER — Ambulatory Visit (INDEPENDENT_AMBULATORY_CARE_PROVIDER_SITE_OTHER): Payer: Medicare Other | Admitting: Physical Medicine and Rehabilitation

## 2021-02-26 VITALS — BP 121/60 | HR 80

## 2021-02-26 DIAGNOSIS — M5412 Radiculopathy, cervical region: Secondary | ICD-10-CM

## 2021-02-26 DIAGNOSIS — M797 Fibromyalgia: Secondary | ICD-10-CM | POA: Diagnosis not present

## 2021-02-26 DIAGNOSIS — M4802 Spinal stenosis, cervical region: Secondary | ICD-10-CM | POA: Diagnosis not present

## 2021-02-26 DIAGNOSIS — M542 Cervicalgia: Secondary | ICD-10-CM

## 2021-02-26 MED ORDER — DIAZEPAM 5 MG PO TABS: ORAL_TABLET | ORAL | 0 refills | Status: DC

## 2021-02-26 NOTE — Progress Notes (Signed)
Pt state neck pain that travels to both shoulders and down to her right hand. Pt state any movements can makes the pain worse. Pt state she had a fall off an latter in 2019 that she never had looked at. Pt state she has pain in her pain also. Pt state she takes pain meds to help ease her pain.  Numeric Pain Rating Scale and Functional Assessment Average Pain 10 Pain Right Now 4 My pain is intermittent, sharp, burning, dull, tingling, and aching Pain is worse with: some activites Pain improves with: medication   In the last MONTH (on 0-10 scale) has pain interfered with the following?  1. General activity like being  able to carry out your everyday physical activities such as walking, climbing stairs, carrying groceries, or moving a chair?  Rating(7)  2. Relation with others like being able to carry out your usual social activities and roles such as  activities at home, at work and in your community. Rating(8)  3. Enjoyment of life such that you have  been bothered by emotional problems such as feeling anxious, depressed or irritable?  Rating(9)

## 2021-02-26 NOTE — Progress Notes (Signed)
Heather Hodges - 75 y.o. female MRN [id number]  Date of birth: Jan 18, 1946  Office Visit Note: Visit Date: 02/26/2021 PCP: Inda Coke, PA Referred by: Inda Coke, [place]  Subjective: Chief Complaint  Patient presents with   Right Shoulder - Pain   Left Shoulder - Pain   Neck - Pain   Right Hand - Pain   HPI: Heather Hodges is a 75 y.o. female who comes in today per the request of Dr. [name] for evaluation of chronic, worsening and severe bilateral neck pain radiating to both shoulders and down right arm. Patient reports pain is exacerbated by movement and activity, describes as a shooting and sore sensation, currently rates as 9 out of 10. Patient reports some relief of pain with rest, Gabapentin and Tramadol. Patient reports chronic neck pain became unbearable after she attended formal physical therapy at [facility] in August of this year.  Patient states she was unable to tolerate treatments and reports that physical therapy actually made her neck pain worse. Patient's recent cervical MRI exhibits multi-level spondylosis and moderate central canal stenosis at C5-C6 with contact and distortion of the ventral surface of the cord. Patient states her pain has started interfering with her ability to function and reports she is now having trouble completing daily tasks.   Patient has a significant medical history and reports chronic issues with thoracic discomfort and generalized pain since a fall off ladder in 2019 where she sustained multiple thoracic compression fractures, cerebral contusion and pneumothorax. Per Dr. [name] the chronic generalized pain seems to be related to fibromyalgia. Patient was previously treated by Dr. Barrington Ellison at [facility] where she had left T4-T5, T5-T6 and T6-T7 medial branch blocks which she reports did not help to alleviate pain. Patient was recently evaluated by Dr. Metta Clines at [facility] where  she had Gabapentin dosage increased to 200 mg three times a day.   Patient denies focal weakness, numbness and tingling. Patient denies recent trauma or falls.   Review of Systems  Musculoskeletal:  Positive for myalgias and neck pain.  Neurological:  Negative for tingling, sensory change, focal weakness and weakness.  All other systems reviewed and are negative. Otherwise per HPI.  Assessment & Plan: Visit Diagnoses:    ICD-10-CM   1. Cervicalgia  M54.2 Ambulatory referral to Physical Medicine Rehab    2. Radiculopathy, cervical region  M54.12 Ambulatory referral to Physical Medicine Rehab    3. Cervical stenosis of spinal canal  M48.02     4. Fibromyalgia  M79.7        Plan: Findings:  Chronic, worsening and severe bilateral neck pain radiating to both shoulders and down right arm.  Patient continues to have excruciating and debilitating pain despite good conservative therapies such as formal physical therapy and use of medications.  We did discuss patient's recent cervical MRI with her today in detail using images and spine model.  Patient's clinical presentation and exam are consistent with C6 nerve pattern.  Patient's recent cervical MRI does exhibit moderate central canal stenosis at C5-C6.  We believe the neck step is to perform a diagnostic and hopefully therapeutic right C7-T1 interlaminar epidural steroid injection under fluoroscopic guidance.  Patient did inform us that she has increased anxiety related to procedures.  We did speak with patient about pre-procedure oral sedation with Valium and did place a prescription for this medication today.  Patient is not currently undergoing long-term anticoagulation therapy.  She has no previous history of the cervical spine surgery.  Patient encouraged to continue being active as tolerated and to also increase her dosage of gabapentin per Dr. Ellard Artis Corey's recommendation.  No red flag symptoms noted upon exam today.   Meds & Orders:  Meds  ordered this encounter  Medications   diazepam (VALIUM) 5 MG tablet    Sig: Take one tablet by mouth with food one hour prior to procedure. May repeat 30 minutes prior if needed.    Dispense:  2 tablet    Refill:  0    Order Specific Question:   Supervising Provider    Answer:   Magnus Sinning [[id number]]    Orders Placed This Encounter  Procedures   Ambulatory referral to Physical Medicine Rehab    Follow-up: Return for Right C7-T1 interlaminar epidural steroid injection.   Procedures: No procedures performed      Clinical History: MRI CERVICAL SPINE WITHOUT CONTRAST   TECHNIQUE: Multiplanar, multisequence MR imaging of the cervical spine was performed. No intravenous contrast was administered.   COMPARISON:  Cervical spine radiographs 02/04/2021   FINDINGS: Alignment: Insert minimal anterolisthesis is present at C7-T1. No significant listhesis is present in the cervical spine otherwise. Mild straightening of the normal cervical lordosis is stable.   Vertebrae: Chronic endplate marrow changes again noted at C4-5, C5-6 and C6-7.   Cord: Normal signal and morphology.   Posterior Fossa, vertebral arteries, paraspinal tissues: Craniocervical junction is normal. Flow is present in the vertebral arteries bilaterally. Visualized intracranial contents are normal.   Disc levels:   C2-3: Asymmetric right-sided facet hypertrophy results in mild right foraminal narrowing. The central canal is patent.   C3-4: Mild facet hypertrophy present bilaterally. Mild bilateral foraminal narrowing noted. The central canal is patent.   C4-5: Uncovertebral spurring is worse on the left. Moderate foraminal narrowing is worse left than right. Partial effacement of ventral CSF is noted.   C5-6: A central disc protrusion effaces the ventral CSF and slightly distorts the ventral surface cord. No abnormal signal is present. Moderate left foraminal stenosis is present. The right foramen  is patent.   C6-7: Mild left-sided uncovertebral spurring and foraminal narrowing is present. A shallow central disc protrusion is present.   C7-T1: Moderate facet hypertrophy is worse left than right. No significant stenosis is present.   IMPRESSION: 1. Multilevel spondylosis of the cervical spine as described. 2. Mild right foraminal narrowing at C2-3 due to facet spurring. 3. Mild bilateral foraminal narrowing at C3-4 due to facet spurring. 4. Moderate foraminal narrowing bilaterally at C4-5 is worse on the left secondary to uncovertebral disease. Mild central canal stenosis is present. 5. Moderate left foraminal stenosis at C5-6. 6. Moderate central canal stenosis at C5-6 with contact and distortion of the ventral surface of the cord. 7. Mild left-sided uncovertebral spurring and foraminal narrowing at C6-7.     Electronically Signed   By: San Morelle M.D.   On: 02/18/2021 13:59   She reports that she has never smoked. She has never used smokeless tobacco. No results for input(s): HGBA1C, LABURIC in the last 8760 hours.  Objective:  VS:  HT:    WT:   BMI:     BP:121/60  HR:80bpm  TEMP: ( )  RESP:  Physical Exam Vitals and nursing note reviewed.  HENT:     Head: Normocephalic and atraumatic.     Right Ear: External ear normal.     Left Ear: External ear normal.  Nose: Nose normal.     Mouth/Throat:     Mouth: Mucous membranes are moist.  Eyes:     Extraocular Movements: Extraocular movements intact.  Cardiovascular:     Rate and Rhythm: Normal rate.     Pulses: Normal pulses.  Pulmonary:     Effort: Pulmonary effort is normal.  Abdominal:     General: Abdomen is flat. There is no distension.  Musculoskeletal:        General: Tenderness present.     Cervical back: Tenderness present.     Comments: Discomfort noted with flexion, extension and side-to-side rotation. Patient has good strength in the upper extremities including 5 out of 5 strength in  wrist extension, long finger flexion and APB.  There is no atrophy of the hands intrinsically. Sensation intact bilaterally. Dysesthesias noted to right C6 dermatome. Negative Hoffman's sign. Equivocally positive Spurling's sign.    Skin:    General: Skin is warm and dry.     Capillary Refill: Capillary refill takes less than 2 seconds.  Neurological:     General: No focal deficit present.     Mental Status: She is alert and oriented to person, place, and time.  Psychiatric:        Mood and Affect: Mood normal.    Ortho Exam  Imaging: No results found.  Past Medical/Family/Surgical/Social History: Medications & Allergies reviewed per EMR, new medications updated. Patient Active Problem List   Diagnosis Date Noted   Fibromyalgia 02/11/2021   Chronic neck pain 02/11/2021   Osteoporosis    Hypothyroidism    Hyperlipidemia    Diverticulosis    Arthritis    Chronic pain syndrome 02/03/2018   Contusion of left lung 07/20/2017   Cerebral contusion 07/17/2017   Traumatic pneumothorax 07/17/2017   Rib fractures 07/16/2017   Vitamin D deficiency 08/06/2015   Past Medical History:  Diagnosis Date   Arthritis    Colon polyps    Diverticulosis    History of chicken pox    Hyperlipidemia    Hypothyroidism    Osteoporosis    Vitamin D deficiency    Family History  Problem Relation Age of Onset   Arthritis Mother    Breast cancer Mother        1s-70s   High Cholesterol Mother    Heart attack Father    High Cholesterol Father    Arthritis Brother    Hearing loss Brother    Hypercholesterolemia Brother    Past Surgical History:  Procedure Laterality Date   BREAST BIOPSY Left    Benign   TONSILLECTOMY AND ADENOIDECTOMY     Social History   Occupational History   Not on file  Tobacco Use   Smoking status: Never   Smokeless tobacco: Never  Substance and Sexual Activity   Alcohol use: Never   Drug use: Never   Sexual activity: Not on file

## 2021-03-11 ENCOUNTER — Ambulatory Visit: Payer: Medicare Other | Admitting: Family Medicine

## 2021-03-12 ENCOUNTER — Other Ambulatory Visit: Payer: Self-pay

## 2021-03-12 ENCOUNTER — Ambulatory Visit (INDEPENDENT_AMBULATORY_CARE_PROVIDER_SITE_OTHER): Payer: Medicare Other | Admitting: Physical Medicine and Rehabilitation

## 2021-03-12 ENCOUNTER — Encounter: Payer: Self-pay | Admitting: Physical Medicine and Rehabilitation

## 2021-03-12 ENCOUNTER — Ambulatory Visit: Payer: Self-pay

## 2021-03-12 VITALS — BP 109/71 | HR 76

## 2021-03-12 DIAGNOSIS — M5412 Radiculopathy, cervical region: Secondary | ICD-10-CM | POA: Diagnosis not present

## 2021-03-12 MED ORDER — METHYLPREDNISOLONE ACETATE 80 MG/ML IJ SUSP
80.0000 mg | Freq: Once | INTRAMUSCULAR | Status: AC
  Administered 2021-03-12: 10:00:00 80 mg

## 2021-03-12 NOTE — Patient Instructions (Signed)

## 2021-03-12 NOTE — Progress Notes (Signed)
Pt state neck pain that travels to both shoulders and down to her right hand. Pt state any movements can makes the pain worse. Pt state she takes over the counter pain meds to help ease her pain.  Numeric Pain Rating Scale and Functional Assessment Average Pain 8   In the last MONTH (on 0-10 scale) has pain interfered with the following?  1. General activity like being  able to carry out your everyday physical activities such as walking, climbing stairs, carrying groceries, or moving a chair?  Rating(10)   +Driver, -BT, -Dye Allergies.

## 2021-03-19 NOTE — Procedures (Signed)
Cervical Epidural Steroid Injection - Interlaminar Approach with Fluoroscopic Guidance  Patient: Heather Hodges      Date of Birth: 08-09-1945 MRN: [id number] PCP: Inda Coke, PA      Visit Date: 03/12/2021   Universal Protocol:    Date/Time: 12/20/226:08 AM  Consent Given By: the patient  Position: PRONE  Additional Comments: Vital signs were monitored before and after the procedure. Patient was prepped and draped in the usual sterile fashion. The correct patient, procedure, and site was verified.   Injection Procedure Details:   Procedure diagnoses: Radiculopathy, cervical region [M54.12]    Meds Administered:  Meds ordered this encounter  Medications   methylPREDNISolone acetate (DEPO-MEDROL) injection 80 mg     Laterality: Right  Location/Site: C7-T1  Needle: 3.5 in., 20 ga. Tuohy  Needle Placement: Paramedian epidural space  Findings:  -Comments: Excellent flow of contrast into the epidural space.  Procedure Details: Using a paramedian approach from the side mentioned above, the region overlying the inferior lamina was localized under fluoroscopic visualization and the soft tissues overlying this structure were infiltrated with 4 ml. of 1% Lidocaine without Epinephrine. A # 20 gauge, Tuohy needle was inserted into the epidural space using a paramedian approach.  The epidural space was localized using loss of resistance along with contralateral oblique bi-planar fluoroscopic views.  After negative aspirate for air, blood, and CSF, a 2 ml. volume of Isovue-250 was injected into the epidural space and the flow of contrast was observed. Radiographs were obtained for documentation purposes.   The injectate was administered into the level noted above.  Additional Comments:  The patient tolerated the procedure well Dressing: 2 x 2 sterile gauze and Band-Aid    Post-procedure details: Patient was observed during the procedure. Post-procedure instructions were  reviewed.  Patient left the clinic in stable condition.

## 2021-03-19 NOTE — Progress Notes (Signed)
Heather Hodges - 75 y.o. female MRN [id number]  Date of birth: [birth year]  Office Visit Note: Visit Date: 03/12/2021 PCP: Inda Coke, PA Referred by: Inda Coke, [place]  Subjective: Chief Complaint  Patient presents with   Neck - Pain   Right Shoulder - Pain   Left Shoulder - Pain   Right Hand - Pain   HPI:  Heather Hodges is a 75 y.o. female who comes in today at the request of Barnet Pall, FNP for planned Right C7-T1 Cervical Interlaminar epidural steroid injection with fluoroscopic guidance.  The patient has failed conservative care including home exercise, medications, time and activity modification.  This injection will be diagnostic and hopefully therapeutic.  Please see requesting physician notes for further details and justification.  ROS Otherwise per HPI.  Assessment & Plan: Visit Diagnoses:    ICD-10-CM   1. Radiculopathy, cervical region  M54.12 XR C-ARM NO REPORT    Epidural Steroid injection    methylPREDNISolone acetate (DEPO-MEDROL) injection 80 mg      Plan: No additional findings.   Meds & Orders:  Meds ordered this encounter  Medications   methylPREDNISolone acetate (DEPO-MEDROL) injection 80 mg    Orders Placed This Encounter  Procedures   XR C-ARM NO REPORT   Epidural Steroid injection    Follow-up: Return if symptoms worsen or fail to improve.   Procedures: No procedures performed  Cervical Epidural Steroid Injection - Interlaminar Approach with Fluoroscopic Guidance  Patient: Heather Hodges      Date of Birth: [birth year] MRN: [id number] PCP: Inda Coke, PA      Visit Date: 03/12/2021   Universal Protocol:    Date/Time: 12/20/226:08 AM  Consent Given By: the patient  Position: PRONE  Additional Comments: Vital signs were monitored before and after the procedure. Patient was prepped and draped in the usual sterile fashion. The correct patient, procedure, and site was verified.   Injection Procedure Details:    Procedure diagnoses: Radiculopathy, cervical region [M54.12]    Meds Administered:  Meds ordered this encounter  Medications   methylPREDNISolone acetate (DEPO-MEDROL) injection 80 mg     Laterality: Right  Location/Site: C7-T1  Needle: 3.5 in., 20 ga. Tuohy  Needle Placement: Paramedian epidural space  Findings:  -Comments: Excellent flow of contrast into the epidural space.  Procedure Details: Using a paramedian approach from the side mentioned above, the region overlying the inferior lamina was localized under fluoroscopic visualization and the soft tissues overlying this structure were infiltrated with 4 ml. of 1% Lidocaine without Epinephrine. A # 20 gauge, Tuohy needle was inserted into the epidural space using a paramedian approach.  The epidural space was localized using loss of resistance along with contralateral oblique bi-planar fluoroscopic views.  After negative aspirate for air, blood, and CSF, a 2 ml. volume of Isovue-250 was injected into the epidural space and the flow of contrast was observed. Radiographs were obtained for documentation purposes.   The injectate was administered into the level noted above.  Additional Comments:  The patient tolerated the procedure well Dressing: 2 x 2 sterile gauze and Band-Aid    Post-procedure details: Patient was observed during the procedure. Post-procedure instructions were reviewed.  Patient left the clinic in stable condition.   Clinical History: MRI CERVICAL SPINE WITHOUT CONTRAST   TECHNIQUE: Multiplanar, multisequence MR imaging of the cervical spine was performed. No intravenous contrast was administered.   COMPARISON:  Cervical spine radiographs 02/04/2021   FINDINGS: Alignment: Insert minimal anterolisthesis is present  at C7-T1. No significant listhesis is present in the cervical spine otherwise. Mild straightening of the normal cervical lordosis is stable.   Vertebrae: Chronic endplate marrow  changes again noted at C4-5, C5-6 and C6-7.   Cord: Normal signal and morphology.   Posterior Fossa, vertebral arteries, paraspinal tissues: Craniocervical junction is normal. Flow is present in the vertebral arteries bilaterally. Visualized intracranial contents are normal.   Disc levels:   C2-3: Asymmetric right-sided facet hypertrophy results in mild right foraminal narrowing. The central canal is patent.   C3-4: Mild facet hypertrophy present bilaterally. Mild bilateral foraminal narrowing noted. The central canal is patent.   C4-5: Uncovertebral spurring is worse on the left. Moderate foraminal narrowing is worse left than right. Partial effacement of ventral CSF is noted.   C5-6: A central disc protrusion effaces the ventral CSF and slightly distorts the ventral surface cord. No abnormal signal is present. Moderate left foraminal stenosis is present. The right foramen is patent.   C6-7: Mild left-sided uncovertebral spurring and foraminal narrowing is present. A shallow central disc protrusion is present.   C7-T1: Moderate facet hypertrophy is worse left than right. No significant stenosis is present.   IMPRESSION: 1. Multilevel spondylosis of the cervical spine as described. 2. Mild right foraminal narrowing at C2-3 due to facet spurring. 3. Mild bilateral foraminal narrowing at C3-4 due to facet spurring. 4. Moderate foraminal narrowing bilaterally at C4-5 is worse on the left secondary to uncovertebral disease. Mild central canal stenosis is present. 5. Moderate left foraminal stenosis at C5-6. 6. Moderate central canal stenosis at C5-6 with contact and distortion of the ventral surface of the cord. 7. Mild left-sided uncovertebral spurring and foraminal narrowing at C6-7.     Electronically Signed   By: San Morelle M.D.   On: 02/18/2021 13:59     Objective:  VS:  HT:     WT:    BMI:      BP:109/71   HR:76bpm   TEMP: ( )   RESP:  Physical  Exam Vitals and nursing note reviewed.  Constitutional:      General: She is not in acute distress.    Appearance: Normal appearance. She is not ill-appearing.  HENT:     Head: Normocephalic and atraumatic.     Right Ear: External ear normal.     Left Ear: External ear normal.  Eyes:     Extraocular Movements: Extraocular movements intact.  Cardiovascular:     Rate and Rhythm: Normal rate.     Pulses: Normal pulses.  Musculoskeletal:     Cervical back: Tenderness present. No rigidity.     Right lower leg: No edema.     Left lower leg: No edema.     Comments: Patient has good strength in the upper extremities including 5 out of 5 strength in wrist extension long finger flexion and APB.  There is no atrophy of the hands intrinsically.  There is a negative Hoffmann's test.   Lymphadenopathy:     Cervical: No cervical adenopathy.  Skin:    Findings: No erythema, lesion or rash.  Neurological:     General: No focal deficit present.     Mental Status: She is alert and oriented to person, place, and time.     Sensory: No sensory deficit.     Motor: No weakness or abnormal muscle tone.     Coordination: Coordination normal.  Psychiatric:        Mood and Affect: Mood normal.  Behavior: Behavior normal.     Imaging: No results found.

## 2021-04-04 ENCOUNTER — Telehealth: Payer: Self-pay

## 2021-04-04 MED ORDER — GABAPENTIN 100 MG PO CAPS: 200.0000 mg | ORAL_CAPSULE | Freq: Three times a day (TID) | ORAL | 1 refills | Status: DC

## 2021-04-04 NOTE — Telephone Encounter (Signed)
New Rx Sent to [facility]

## 2021-04-04 NOTE — Telephone Encounter (Signed)
Patient called stating that she went to see Dr. Tomi Likens and was told to increase her 100mg  gabapentin to 2pills 3 times daily total of 600mg . Patient states she called the office to get a refill and was told that she would need to get the refill from our office. [facility] and would like a call when it is done.

## 2021-04-10 ENCOUNTER — Other Ambulatory Visit: Payer: Self-pay | Admitting: Family Medicine

## 2021-04-10 NOTE — Telephone Encounter (Signed)
Rx refill request approved per Dr. Corey's orders. 

## 2021-04-23 ENCOUNTER — Ambulatory Visit (INDEPENDENT_AMBULATORY_CARE_PROVIDER_SITE_OTHER): Payer: Medicare Other | Admitting: Physician Assistant

## 2021-04-23 ENCOUNTER — Encounter: Payer: Self-pay | Admitting: Physician Assistant

## 2021-04-23 ENCOUNTER — Other Ambulatory Visit: Payer: Self-pay

## 2021-04-23 VITALS — BP 120/64 | HR 72 | Temp 97.3°F | Ht 60.0 in | Wt 130.2 lb

## 2021-04-23 DIAGNOSIS — R0789 Other chest pain: Secondary | ICD-10-CM | POA: Diagnosis not present

## 2021-04-23 DIAGNOSIS — E039 Hypothyroidism, unspecified: Secondary | ICD-10-CM

## 2021-04-23 DIAGNOSIS — N644 Mastodynia: Secondary | ICD-10-CM

## 2021-04-23 DIAGNOSIS — Z23 Encounter for immunization: Secondary | ICD-10-CM | POA: Diagnosis not present

## 2021-04-23 DIAGNOSIS — M797 Fibromyalgia: Secondary | ICD-10-CM

## 2021-04-23 LAB — CBC WITH DIFFERENTIAL/PLATELET
Basophils Absolute: 0.1 10*3/uL (ref 0.0–0.1)
Basophils Relative: 0.7 % (ref 0.0–3.0)
Eosinophils Absolute: 0.1 10*3/uL (ref 0.0–0.7)
Eosinophils Relative: 1 % (ref 0.0–5.0)
HCT: 37.6 % (ref 36.0–46.0)
Hemoglobin: 12.6 g/dL (ref 12.0–15.0)
Lymphocytes Relative: 29.6 % (ref 12.0–46.0)
Lymphs Abs: 2.4 10*3/uL (ref 0.7–4.0)
MCHC: 33.4 g/dL (ref 30.0–36.0)
MCV: 93.1 fl (ref 78.0–100.0)
Monocytes Absolute: 0.6 10*3/uL (ref 0.1–1.0)
Monocytes Relative: 7.6 % (ref 3.0–12.0)
Neutro Abs: 5 10*3/uL (ref 1.4–7.7)
Neutrophils Relative %: 61.1 % (ref 43.0–77.0)
Platelets: 239 10*3/uL (ref 150.0–400.0)
RBC: 4.04 Mil/uL (ref 3.87–5.11)
RDW: 13.5 % (ref 11.5–15.5)
WBC: 8.1 10*3/uL (ref 4.0–10.5)

## 2021-04-23 LAB — COMPREHENSIVE METABOLIC PANEL
ALT: 15 U/L (ref 0–35)
AST: 18 U/L (ref 0–37)
Albumin: 4.1 g/dL (ref 3.5–5.2)
Alkaline Phosphatase: 64 U/L (ref 39–117)
BUN: 9 mg/dL (ref 6–23)
CO2: 29 mEq/L (ref 19–32)
Calcium: 9.1 mg/dL (ref 8.4–10.5)
Chloride: 102 mEq/L (ref 96–112)
Creatinine, Ser: 0.76 mg/dL (ref 0.40–1.20)
GFR: 75.89 mL/min (ref >60.00)
Glucose, Bld: 70 mg/dL (ref 70–99)
Potassium: 4.8 mEq/L (ref 3.5–5.1)
Sodium: 138 mEq/L (ref 135–145)
Total Bilirubin: 0.6 mg/dL (ref 0.2–1.2)
Total Protein: 7 g/dL (ref 6.0–8.3)

## 2021-04-23 LAB — TSH: TSH: 1.02 u[IU]/mL (ref 0.35–5.50)

## 2021-04-23 LAB — CK: Total CK: 47 U/L (ref 7–177)

## 2021-04-23 NOTE — Progress Notes (Signed)
Heather Hodges is a 76 y.o. female here for left breast pain.   History of Present Illness:   Chief Complaint  Patient presents with   Breast Pain    Pt c/o pain under left breast off and on since Mammogram in Nov.    HPI  Left Breast Pain/Chest Heather Hodges presents with c/o intermittent underside left breast pain that has been onset for about two months. Pt has seen me about this issue before on 07/13/19, where she informed me that pain began occurring intermittently after receiving a chest tube in that area due to a pneumothorax in 2019. Due to nature of sx and past hx, I referred her to Dr. [name], sports medicine for further evaluation as well as to have an updated mammogram completed. Following this, her results were WNL and she proceeded to follow up with Dr. Georgina Snell, sports medicine for further evaluation.   Although she was managing well with regular visits and remaining compliant with taking gabapentin 200 TID, she noticed that her pain was occasionally returning. According to pt, it wasn't until after her recent mammogram on 01/31/21, that her pain became more frequent, almost constant. Due to this, she believes that the mammogram had irritated the area. Pt states that pain is similar to past pain but describes pain to be bone related versus muscular. Denies nipple discharge, SOB, or CP.   Despite findings of the mammogram showing there was no evidence of malignancy, there was breast tissue that was heterogeneously dense, which may obscure small masses. At this time, pt is wanting to focus on provided relief rather than finding out what is occurring.   Hypothyroidism Pt is compliant with taking synthroid 88 mcg every other day, alternating with synthroid 75 mcg every other day with no adverse effects. She is interested in having these levels re-checked at this times. Denies ongoing sx.   Fibromyalgia Heather Hodges was recently dx with this issue following her many visits with Dr.  [name], sports medicine. Although she is no longer taking tramadol 50 mg daily, she is compliant with taking cymbalta 20 mg daily with no complications. She is currently following up with Dr. Georgina Snell and is managing well.   Past Medical History:  Diagnosis Date   Arthritis    Colon polyps    Diverticulosis    History of chicken pox    Hyperlipidemia    Hypothyroidism    Osteoporosis    Vitamin D deficiency      Social History   Tobacco Use   Smoking status: Never   Smokeless tobacco: Never  Substance Use Topics   Alcohol use: Never   Drug use: Never    Past Surgical History:  Procedure Laterality Date   BREAST BIOPSY Left    Benign   TONSILLECTOMY AND ADENOIDECTOMY      Family History  Problem Relation Age of Onset   Arthritis Mother    Breast cancer Mother        49s-70s   High Cholesterol Mother    Heart attack Father    High Cholesterol Father    Arthritis Brother    Hearing loss Brother    Hypercholesterolemia Brother     Allergies  Allergen Reactions   Risedronate Sodium Other (See Comments)    TMJ dysfunction    Current Medications:   Current Outpatient Medications:    atorvastatin (LIPITOR) 40 MG tablet, Take 1 tablet (40 mg total) by mouth daily., Disp: 90 tablet, Rfl: 3  b complex vitamins capsule, Take 1 capsule by mouth every other day., Disp: , Rfl:    Calcium Carbonate-Vitamin D (CALCIUM 500+D PO), Take 2 each by mouth every other day., Disp: , Rfl:    DULoxetine (CYMBALTA) 20 MG capsule, TAKE 1 CAPSULE BY MOUTH EVERY DAY, Disp: 30 capsule, Rfl: 1   gabapentin (NEURONTIN) 100 MG capsule, Take 2 capsules (200 mg total) by mouth 3 (three) times daily., Disp: 540 capsule, Rfl: 1   ketoconazole (NIZORAL) 2 % cream, Apply to affected area 1-2 times daily, Disp: 15 g, Rfl: 0   levothyroxine (SYNTHROID) 75 MCG tablet, Take 1 tablet (75 mcg total) by mouth every other day., Disp: 90 tablet, Rfl: 1   levothyroxine (SYNTHROID) 88 MCG tablet, Take 1  tablet (88 mcg total) by mouth every other day., Disp: 90 tablet, Rfl: 1   lidocaine (XYLOCAINE) 4 % external solution, Apply topically., Disp: , Rfl:    Lidocaine 4 % PTCH, Place onto the skin., Disp: , Rfl:    meloxicam (MOBIC) 7.5 MG tablet, Take 1 tablet (7.5 mg total) by mouth daily., Disp: 90 tablet, Rfl: 3   Tart Cherry 1200 MG CAPS, Take 1 capsule by mouth daily in the afternoon., Disp: , Rfl:    diazepam (VALIUM) 5 MG tablet, Take one tablet by mouth with food one hour prior to procedure. May repeat 30 minutes prior if needed. (Patient not taking: Reported on 04/23/2021), Disp: 2 tablet, Rfl: 0   traMADol (ULTRAM) 50 MG tablet, Take 50 mg by mouth 2 (two) times daily. (Patient not taking: Reported on 04/23/2021), Disp: , Rfl:    Review of Systems:   ROS Negative unless otherwise specified per HPI. Vitals:   Vitals:   04/23/21 0853  BP: 120/64  Pulse: 72  Temp: (!) 97.3 F (36.3 C)  TempSrc: Temporal  SpO2: 94%  Weight: 130 lb 4 oz (59.1 kg)  Height: 5' (1.524 m)     Body mass index is 25.44 kg/m.  Physical Exam:   Physical Exam Vitals and nursing note reviewed.  Constitutional:      General: She is not in acute distress.    Appearance: She is well-developed. She is not ill-appearing or toxic-appearing.  Cardiovascular:     Rate and Rhythm: Normal rate and regular rhythm.     Pulses: Normal pulses.     Heart sounds: Normal heart sounds, S1 normal and S2 normal.  Pulmonary:     Effort: Pulmonary effort is normal.     Breath sounds: Normal breath sounds.  Musculoskeletal:     Comments: Area of tenderness through left chest wall -- most concentrated under L breast and along left upper outer breast tissue  Skin:    General: Skin is warm and dry.  Neurological:     Mental Status: She is alert.     GCS: GCS eye subscore is 4. GCS verbal subscore is 5. GCS motor subscore is 6.  Psychiatric:        Speech: Speech normal.        Behavior: Behavior normal. Behavior is  cooperative.    Assessment and Plan:   Breast pain, left; Chest wall pain Ongoing She declines additional mammogram as this caused her symptoms We discussed obtaining chest CT for further evaluation but at this point she declines any diagnostic work-up She would like to continue to see Dr. Georgina Snell for nonopioid pain management  - CBC with Differential/Platelet  - Comp Met  Acquired hypothyroidism Controlled Continue synthroid 88  and 75 mcg alternating daily  Update labs today, will adjust medication as indicated by results  - TSH  Fibromyalgia Controlled  Continue gabapentin 200 mg TID and Cymbalta 20 mg daily  Update labs, will make recommendations accordingly  I am also going to update her CK level given her statin use just to rule out statin induced myopathy Follow up with Dr. Georgina Snell, sports medicine for further evaluation  I,Havlyn C Ratchford,acting as a scribe for Inda Coke, PA.,have documented all relevant documentation on the behalf of Inda Coke, PA,as directed by  Inda Coke, PA while in the presence of Inda Coke, [place].  I, Inda Coke, [place], have reviewed all documentation for this visit. The documentation on 04/23/21 for the exam, diagnosis, procedures, and orders are all accurate and complete.   Inda Coke, PA-C

## 2021-04-23 NOTE — Patient Instructions (Signed)
It was great to see you!  We will update your blood work today  I will send Dr Georgina Snell a message  Take care,  Inda Coke PA-C

## 2021-04-29 NOTE — Progress Notes (Signed)
I, Wendy Poet, LAT, ATC, am serving as scribe for Dr. Lynne Hodges.  Heather Hodges is a 76 y.o. female who presents to [place] at [facility] today for f/u of chronic neck pain and headaches/dizziness thought to be due to occipital neuralgia and cervical DJD.  Her hx is significant for right hemorrhagic contusion in April 2019 following a fall off of a ladder.  She also sustained  thoracic vertebral body fractures as well as a pneumothorax. She was last seen by Dr. Georgina Snell on 02/11/21 after completing a course of PT and after seeing Dr. Tomi Likens w/ Neurology.  She was referred for a cervical MRI and then had a cervical ESI on 03/12/21 (R C7-T1).  Today, pt reports increased pain along the L side of her rib cage under her L breast after mammogram on 11/3. Pt c/o increased, sharp pain when she flexing forward.  She notes that the pain radiates from the posterior left thoracic spine around the lateral chest wall to the anterior left chest wall.. Pt reports sometimes the rib pain is severe enough that she holds her L breast up and guards her L arm against her chest. Pt notes increased in cervical motion and less pain in her neck.   She  Diagnostic testing: C-spine MRI- 02/16/21; C-spine XR- 12/05/20  Pertinent review of systems: No fevers or chills  Relevant historical information: Osteoporosis.   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 Patient notes that she had a chest tube after a fall that performed at an outside hospital in 2021.  Exam:  BP 118/76    Pulse 75    Ht 5' (1.524 m)    Wt 131 lb (59.4 kg)    SpO2 96%    BMI 25.58 kg/m  General: Well Developed, well nourished, and in no acute distress.   MSK: T-spine nontender midline decreased thoracic motion. Tender palpation left anterior chest wall.   Lab and Radiology Results  X-ray images left ribs and left chest obtained today personally and independently interpreted. Old  healed rib fracture visible left chest.  Callus formation is present.  No acute nondisplaced rib fractures are present per my read. Await formal radiology review  EXAM: THORACIC SPINE 2 VIEWS   COMPARISON:  06/28/2019   FINDINGS: Compression deformity is again noted at T3 and T6 similar in appearance to that noted on the prior exam. No new compression deformity is seen. No paraspinal mass is noted. Aortic calcifications are seen. The lungs are clear.   IMPRESSION: Chronic T3 and T6 compression deformities. No acute abnormality noted.     Electronically Signed   By: Inez Catalina M.D.   On: 11/14/2020 22:04 I, Heather Hodges, personally (independently) visualized and performed the interpretation of the images attached in this note.    Assessment and Plan: 76 y.o. female with left anterior chest wall pain.  Pain is perhaps multifactorial.  She is at risk for thoracic radiculopathy at around of the T6 dermatomal pattern as she has a compression fracture at this area that has not been fully evaluated with an MRI.  Her pain distribution and pattern does fit this.  Additionally she has to my read an old rib fracture in this area that could be bothering her.  It is possible that she did break her ribs in November getting a mammogram in the right were seen a healed rib fracture now.  However I would expect it should feel  better by now. Await formal radiology read of rib x-ray and proceed with thoracic MRI to evaluate for thoracic radiculopathy. Anticipate possible epidural steroid injection for T-spine radiculopathy.  PDMP not reviewed this encounter. Orders Placed This Encounter  Procedures   DG Ribs Unilateral W/Chest Left    Standing Status:   Future    Number of Occurrences:   1    Standing Expiration Date:   04/30/2022    Order Specific Question:   Reason for Exam (SYMPTOM  OR DIAGNOSIS REQUIRED)    Answer:   left rib pain    Order Specific Question:   Preferred imaging location?     Answer:   [facility]   MR THORACIC SPINE WO CONTRAST    Standing Status:   Future    Standing Expiration Date:   04/30/2022    Order Specific Question:   What is the patient's sedation requirement?    Answer:   No Sedation    Order Specific Question:   Does the patient have a pacemaker or implanted devices?    Answer:   No    Order Specific Question:   Preferred imaging location?    Answer:   [facility] (table limit-350lbs)   No orders of the defined types were placed in this encounter.    Discussed warning signs or symptoms. Please see discharge instructions. Patient expresses understanding.   The above documentation has been reviewed and is accurate and complete Heather Hodges, M.D.

## 2021-04-30 ENCOUNTER — Other Ambulatory Visit: Payer: Self-pay

## 2021-04-30 ENCOUNTER — Ambulatory Visit (INDEPENDENT_AMBULATORY_CARE_PROVIDER_SITE_OTHER): Payer: Medicare Other | Admitting: Family Medicine

## 2021-04-30 ENCOUNTER — Ambulatory Visit (INDEPENDENT_AMBULATORY_CARE_PROVIDER_SITE_OTHER): Payer: Medicare Other

## 2021-04-30 VITALS — BP 118/76 | HR 75 | Ht 60.0 in | Wt 131.0 lb

## 2021-04-30 DIAGNOSIS — M5414 Radiculopathy, thoracic region: Secondary | ICD-10-CM

## 2021-04-30 DIAGNOSIS — R0781 Pleurodynia: Secondary | ICD-10-CM

## 2021-04-30 DIAGNOSIS — R0789 Other chest pain: Secondary | ICD-10-CM

## 2021-04-30 IMAGING — DX DG RIBS W/ CHEST 3+V*L*
3 series · 3 of 3 positions shown · non-contrast
Comparison: Chest x-ray with ribs [DATE].

CLINICAL DATA: Left-sided rib pain.

EXAM:
LEFT RIBS AND CHEST - 3+ VIEW

[chest pa]
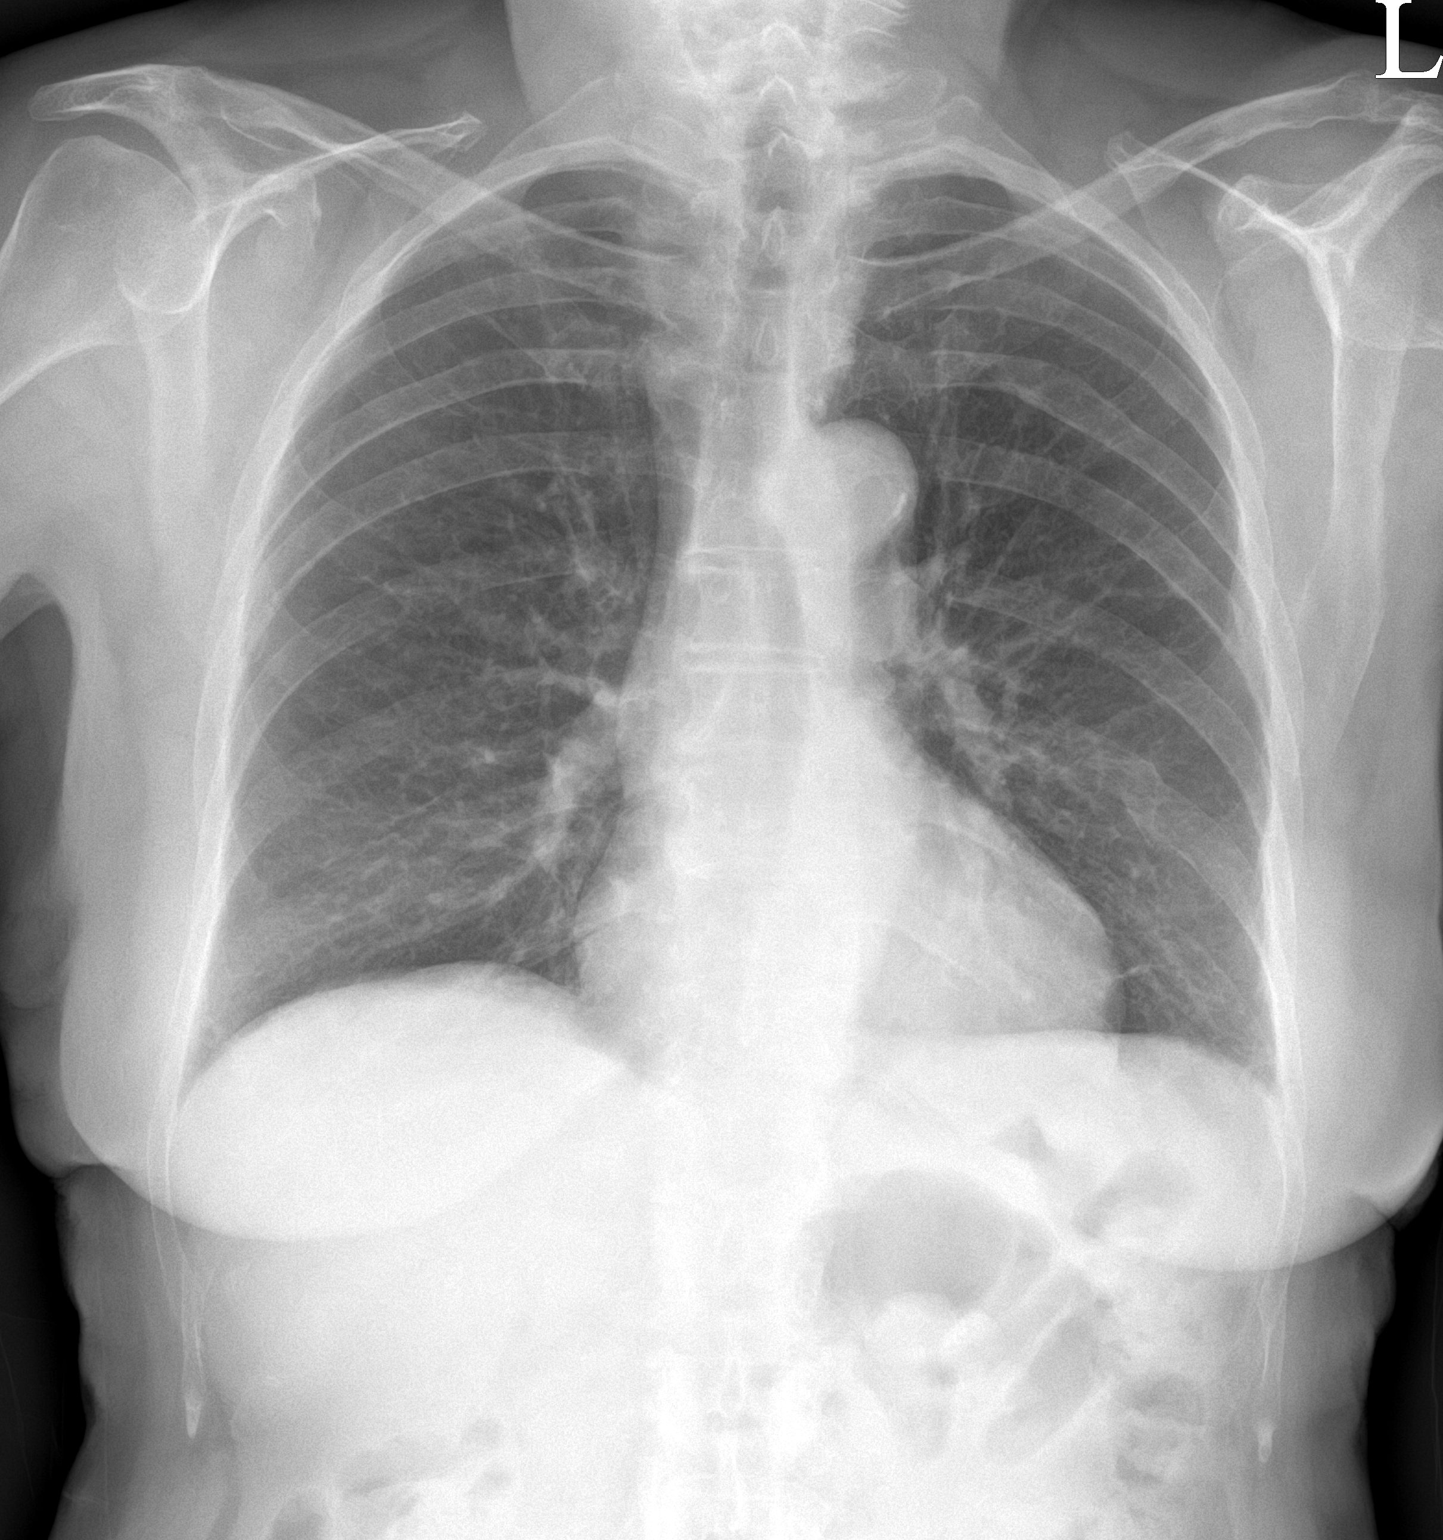

[rib ap]
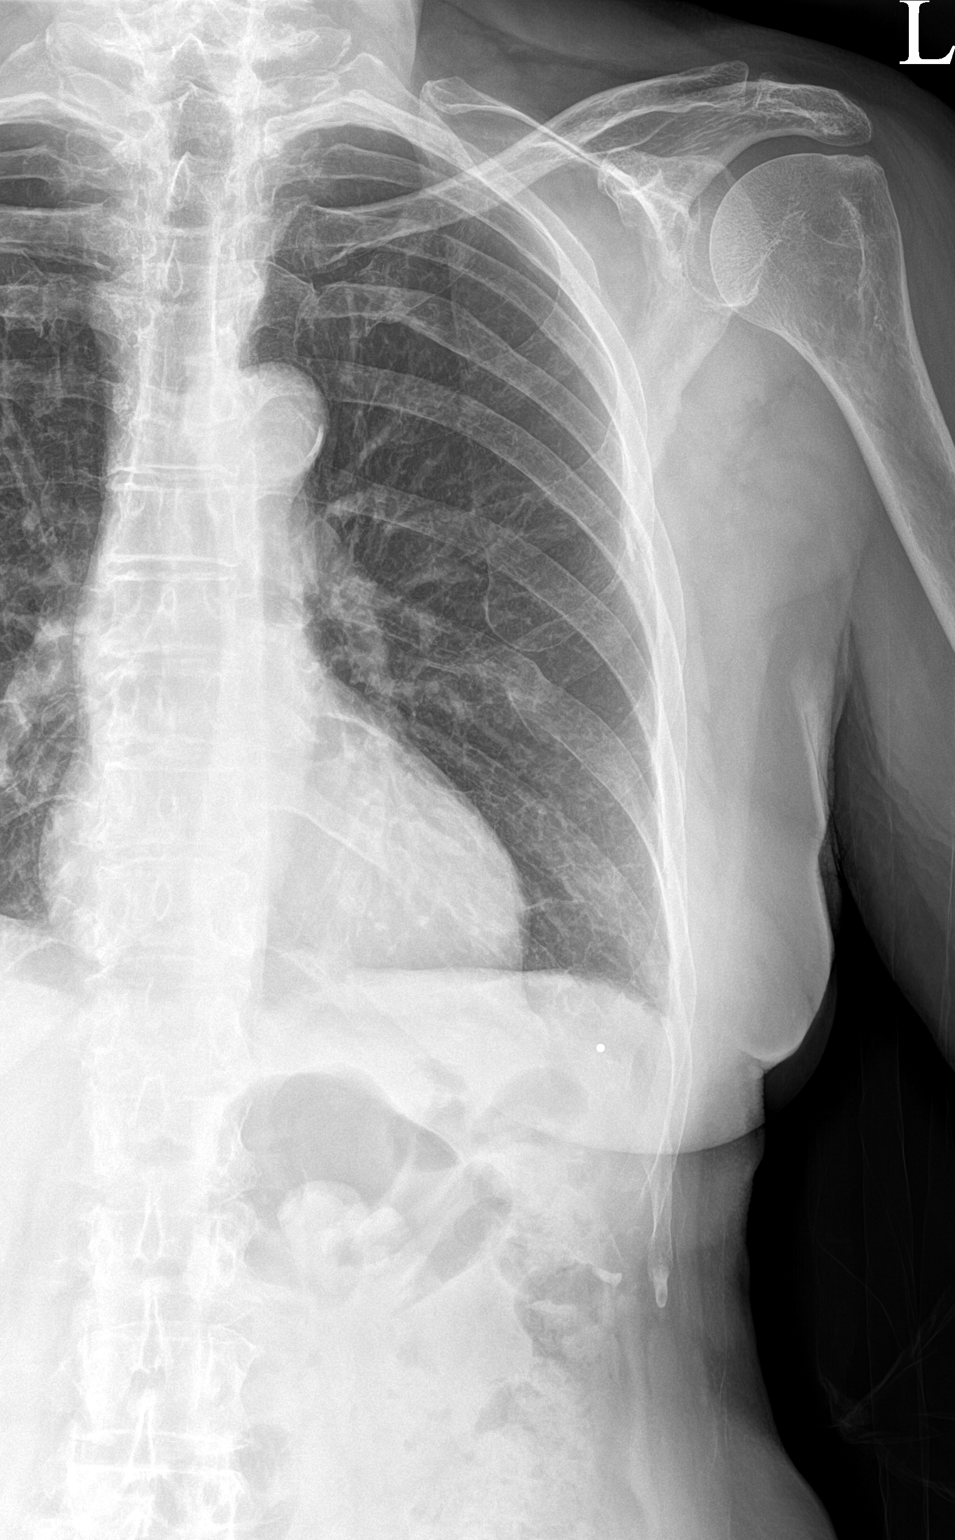

[rib obl]
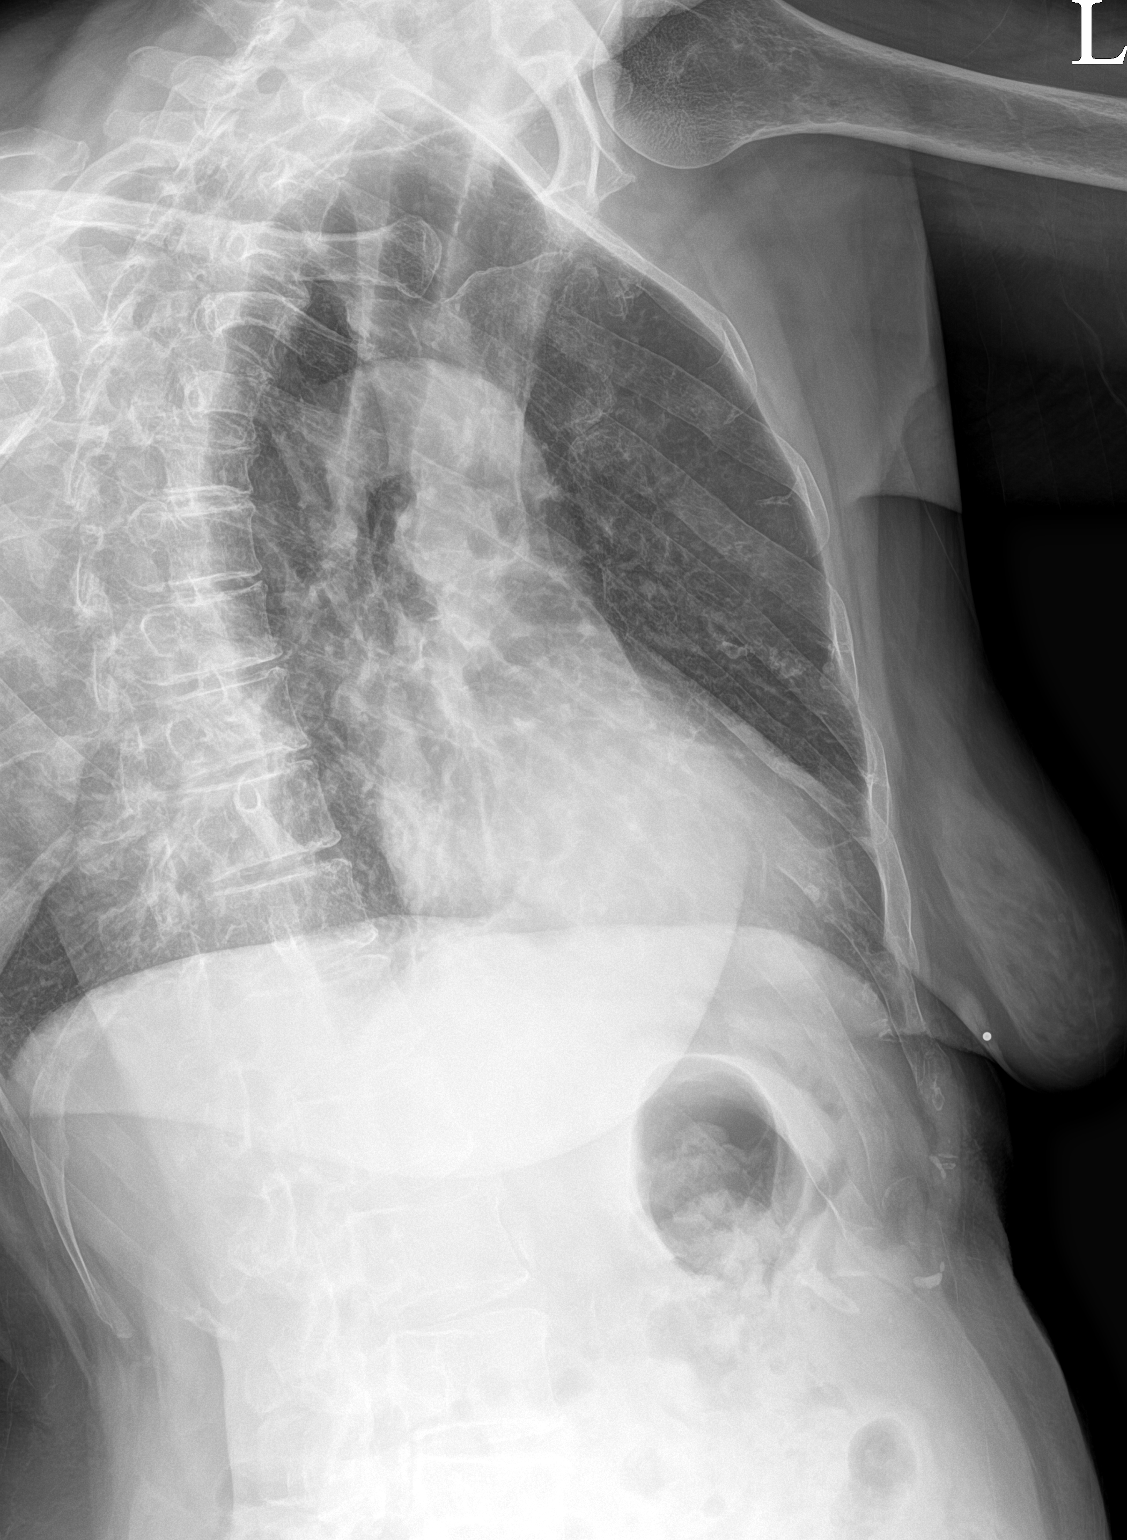

[3 of 3 positions shown; findings below may reference images not displayed]

FINDINGS: There are healed left fifth and eighth rib fractures. No acute
fractures are seen.

There is no evidence of pneumothorax or pleural effusion. Both lungs
are clear. Heart size and mediastinal contours are within normal
limits.
IMPRESSION: No acute rib fractures.  No acute cardiopulmonary process.

## 2021-04-30 NOTE — Patient Instructions (Addendum)
Thank you for coming in today.   You should hear from MRI scheduling within 1 week. If you do not hear please let me know.    Recheck after MRI 

## 2021-05-01 NOTE — Progress Notes (Signed)
Rib x-ray shows a old healed left and eighth rib fracture.  No acute (new) rib fractures are visible.

## 2021-05-06 ENCOUNTER — Ambulatory Visit (INDEPENDENT_AMBULATORY_CARE_PROVIDER_SITE_OTHER): Payer: Medicare Other

## 2021-05-06 ENCOUNTER — Other Ambulatory Visit: Payer: Self-pay

## 2021-05-06 DIAGNOSIS — M5414 Radiculopathy, thoracic region: Secondary | ICD-10-CM

## 2021-05-06 IMAGING — MR MR THORACIC SPINE W/O CM
5 series · 39 of 48 positions shown · non-contrast
Comparison: None.

CLINICAL DATA: Compression fracture, thoracic

EXAM:
MRI THORACIC SPINE WITHOUT CONTRAST
TECHNIQUE: Multiplanar, multisequence MR imaging of the thoracic spine was
performed. No intravenous contrast was administered.

[Series 4: T2 · sagittal · 3.0mm · 1.00mm/px · 6 of 17 slices shown (1 of 2)]
[im 1/17]
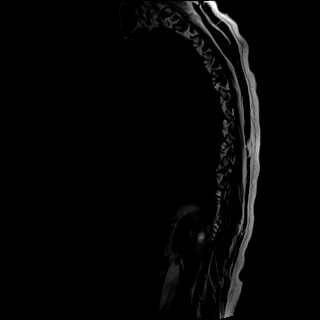
[im 4/17]
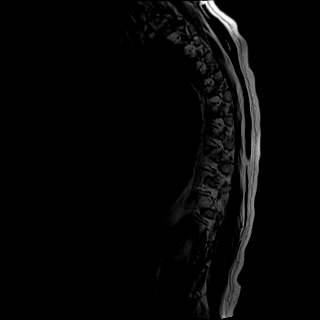
[im 7/17]
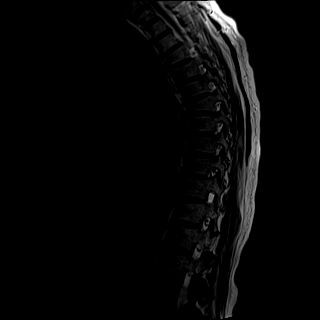
[im 10/17]
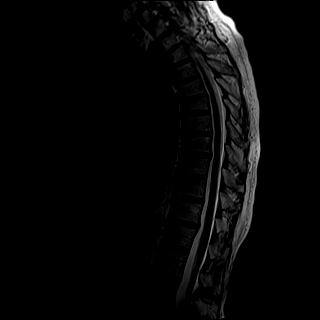
[im 13/17]
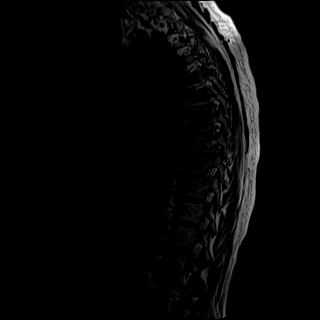
[im 17/17]
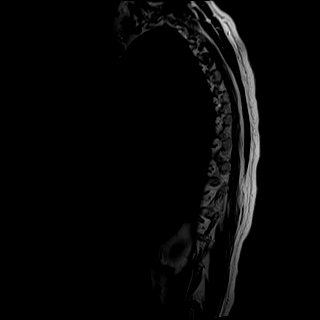

[Series 5: T1 · sagittal · 3.0mm · 1.00mm/px · 6 of 17 slices shown]
[im 1/17]
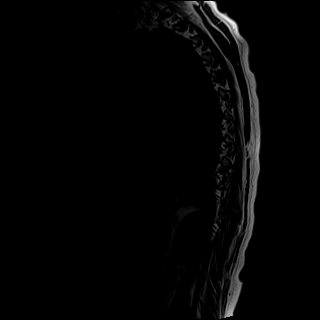
[im 4/17]
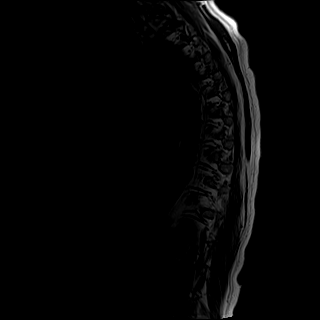
[im 7/17]
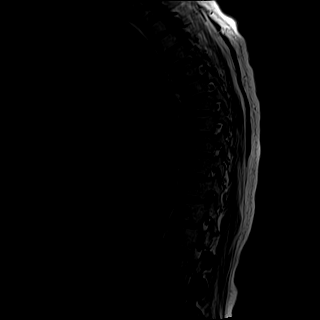
[im 10/17]
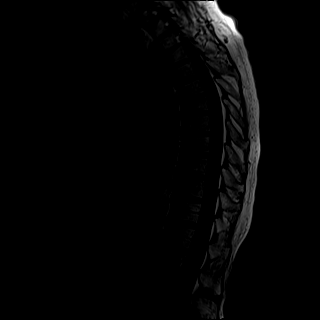
[im 13/17]
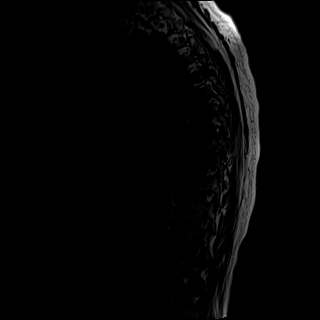
[im 17/17]
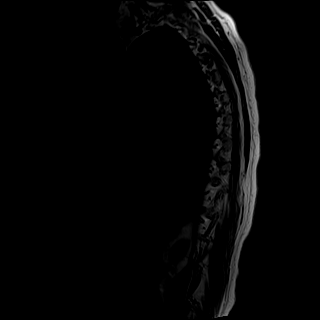

[Series 6: STIR · sagittal · 3.0mm · 1.00mm/px · 6 of 17 slices shown]
[im 1/17]
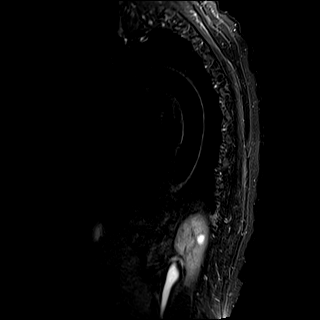
[im 4/17]
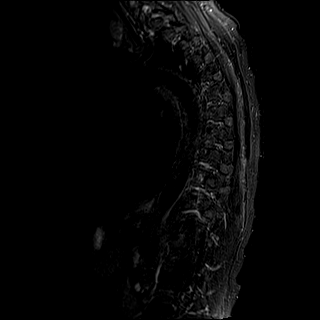
[im 7/17]
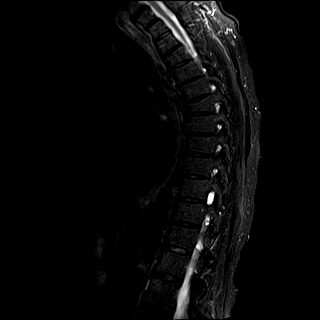
[im 10/17]
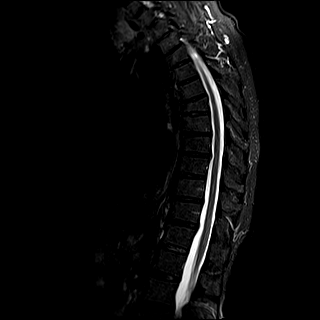
[im 13/17]
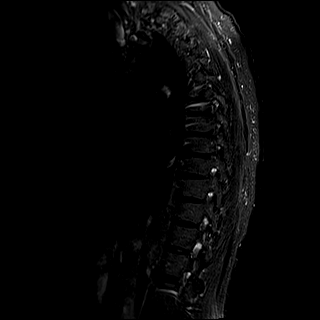
[im 17/17]
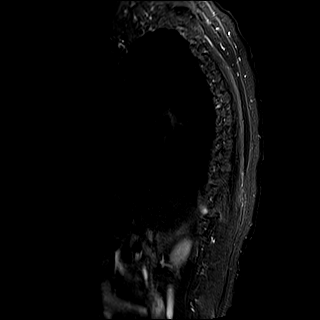

[Series 7: T2 · axial · 5.0mm · 0.78mm/px · z∈[-176,-16]mm · 13 of 39 slices shown (2 of 2)]
[im 1/39]
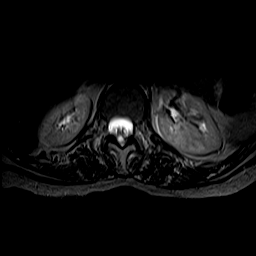
[im 3/39]
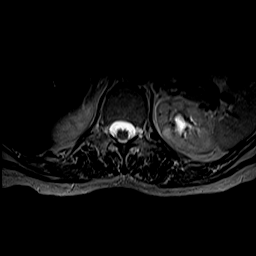
[im 6/39]
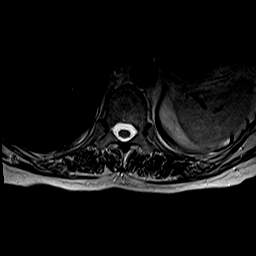
[im 9/39]
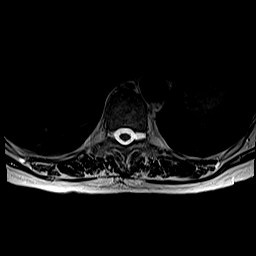
[im 11/39]
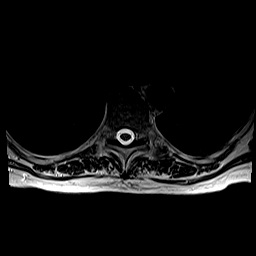
[im 14/39]
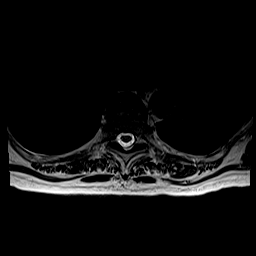
[im 17/39]
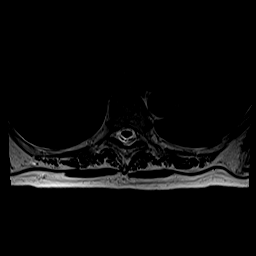
[im 20/39]
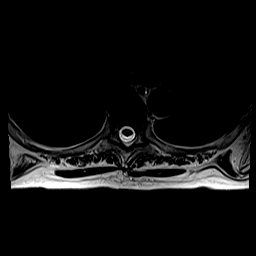
[im 22/39]
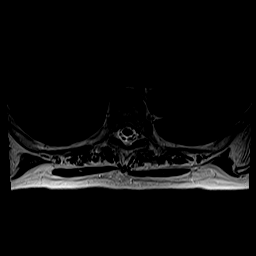
[im 25/39]
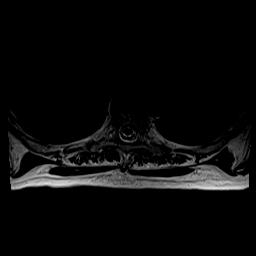
[im 28/39]
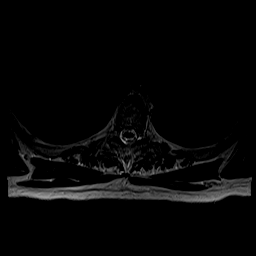
[im 33/39]
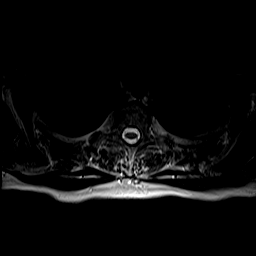
[im 39/39]
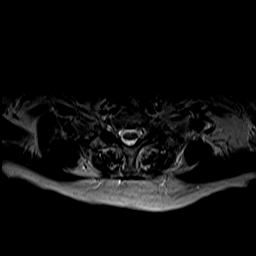

[Series 8: mpgr ax · axial · 5.0mm · 0.39mm/px · z∈[-176,-16]mm · 8 of 39 slices shown]
[im 1/39]
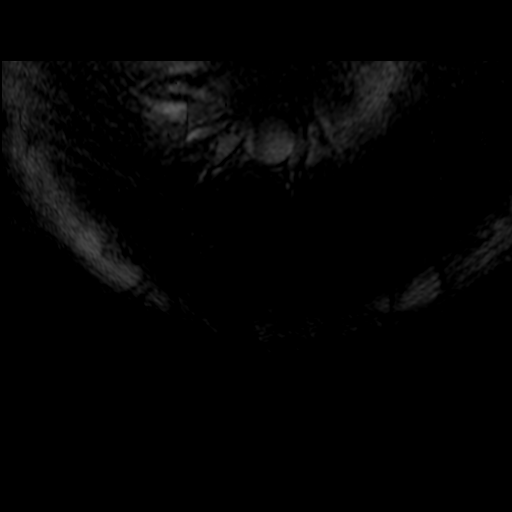
[im 6/39]
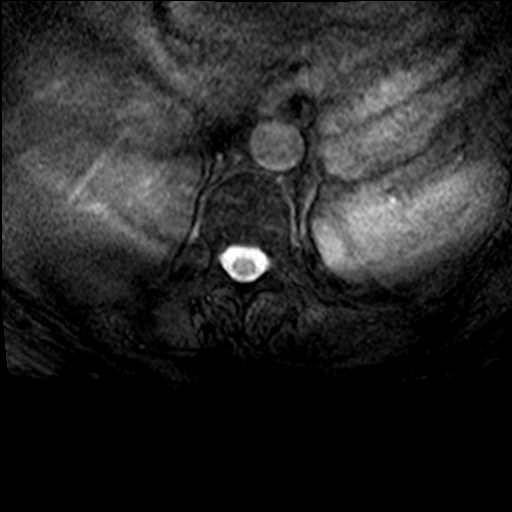
[im 11/39]
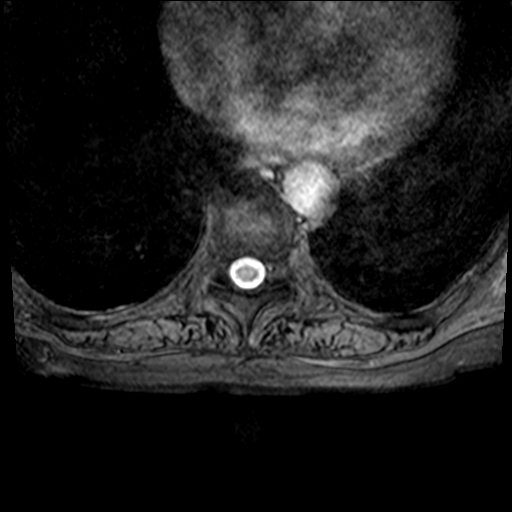
[im 17/39]
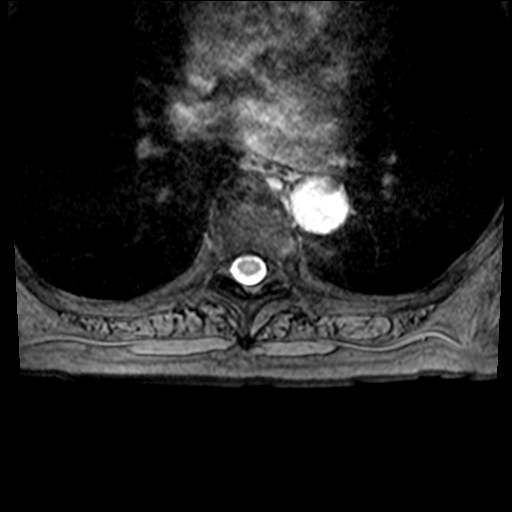
[im 22/39]
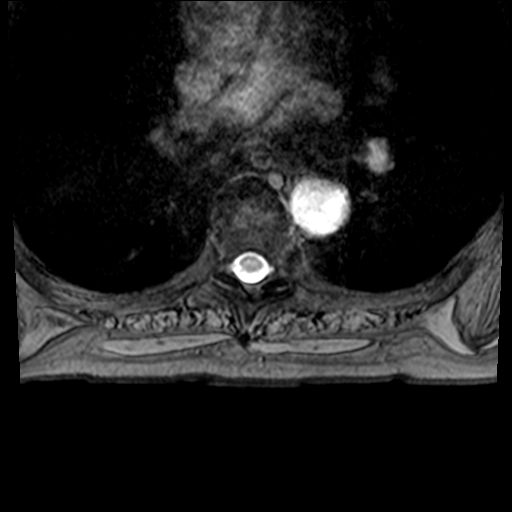
[im 28/39]
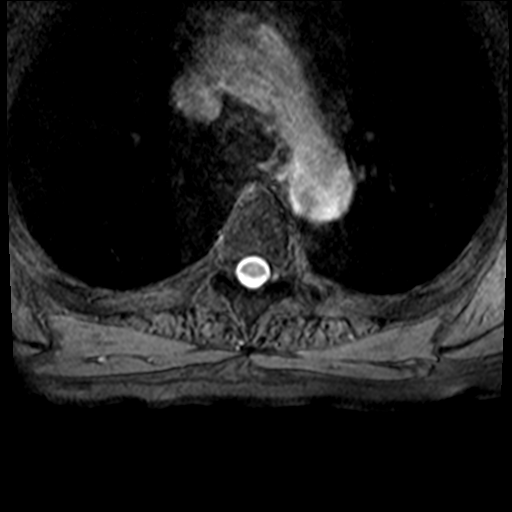
[im 33/39]
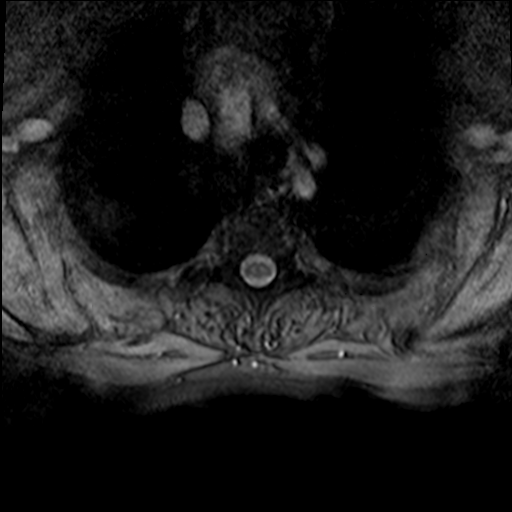
[im 39/39]
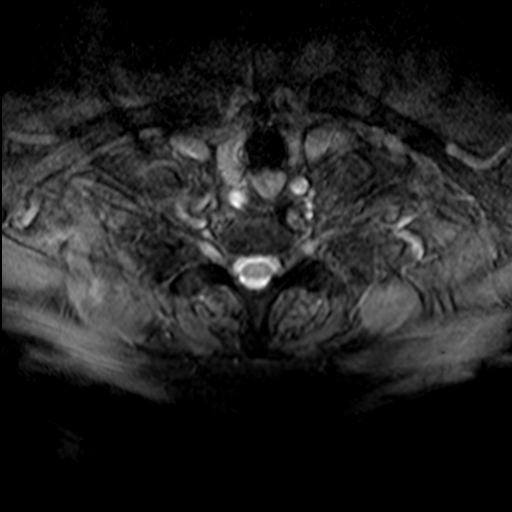

[39 of 48 positions shown; findings below may reference images not displayed]

FINDINGS: Alignment:  Trace degenerative anterolisthesis at T2-T3.

Vertebrae: There are chronic anterior compression deformities of T3
and T6 with 20% and 45% height loss respectively. No evidence of
progressive height loss. There is no acute thoracic spine fracture.
No aggressive osseous lesion.

Cord: No abnormal cord signal.

Paraspinal and other soft tissues: Negative

Disc levels: Mild central disc bulging at T8-T9, T10-T11, and
T11-T12 results in no significant spinal canal stenosis. No neural
foraminal stenosis at any level. There is a perineural cyst on the
left at T10-T11 and T12-L1.
IMPRESSION: Chronic anterior compression deformities of T3 and T6 with 20% of
45% height loss respectively. No evidence of progressive height
loss.

No significant spinal canal or neural foraminal stenosis in the
thoracic spine.

## 2021-05-07 NOTE — Progress Notes (Signed)
Thoracic spine MRI shows chronic compression fractures.  These do not appear to be new and probably are not going to get better with an attempt of kyphoplasty or vertebroplasty.  No evidence of further compression fractures are present in the back. No pinched nerves are visible in the thoracic spine either.  We will talk about this MRI in full detail during your follow-up visit on February 8.

## 2021-05-07 NOTE — Progress Notes (Signed)
I, Wendy Poet, LAT, ATC, am serving as scribe for Dr. [name].   Heather Hodges is a 76 y.o. female who presents to [place] at [facility] today for f/u of thoracic back pain and chronic neck pain and to review her T-spine MRI indicating T3 and T6 compression fractures.  She was last seen by Dr. Georgina Snell on 04/30/21 w/ increased L-sided rib pain after having a mammogram on 01/31/21.  Pt c/o increased, sharp pain when she flexing forward.  She notes that the pain radiates from the posterior left thoracic spine around the lateral chest wall to the anterior left chest wall.. Pt reports sometimes the rib pain is severe enough that she holds her L breast up and guards her L arm against her chest.  She was referred for a T-spine MRI to assess for possible thoracic radiculopathy.  Today, pt reports that her pain remains about the same but notes that her pain medication is helping (gabapentin and duloxetine).  She also notes that back in August 2020 she had a thoracic facet medial branch block attempt that she thinks may have caused this overall pain (Dr Ermalene Postin Neurology)  Diagnostic testing: T-spine MRI- 05/06/21; Chest/rib XR- 04/30/21; C-spine MRI- 02/16/21; C-spine XR- 12/05/20   Pertinent review of systems: No fevers or chills  Relevant historical information: History of a traumatic pneumothorax and rib fractures.   Exam:  BP 120/70 (BP Location: Left Arm, Patient Position: Sitting, Cuff Size: Normal)    Pulse 75    Ht 5' (1.524 m)    Wt 132 lb 12.8 oz (60.2 kg)    SpO2 96%    BMI 25.94 kg/m  General: Well Developed, well nourished, and in no acute distress.   MSK: Left chest decreased thoracic motion.  Tender palpation left lateral chest wall.    Lab and Radiology Results No results found for this or any previous visit (from the past 72 hour(s)). MR THORACIC SPINE WO CONTRAST  Result Date: 05/06/2021 CLINICAL DATA:  Compression fracture, thoracic EXAM: MRI THORACIC SPINE  WITHOUT CONTRAST TECHNIQUE: Multiplanar, multisequence MR imaging of the thoracic spine was performed. No intravenous contrast was administered. COMPARISON:  None. FINDINGS: Alignment:  Trace degenerative anterolisthesis at T2-T3. Vertebrae: There are chronic anterior compression deformities of T3 and T6 with 20% and 45% height loss respectively. No evidence of progressive height loss. There is no acute thoracic spine fracture. No aggressive osseous lesion. Cord: No abnormal cord signal. Paraspinal and other soft tissues: Negative Disc levels: Mild central disc bulging at T8-T9, T10-T11, and T11-T12 results in no significant spinal canal stenosis. No neural foraminal stenosis at any level. There is a perineural cyst on the left at T10-T11 and T12-L1. 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 No evidence of progressive height loss. No significant spinal canal or neural foraminal stenosis in the thoracic spine. Electronically Signed   By: Maurine Simmering M.D.   On: 05/06/2021 16:35    EXAM: LEFT RIBS AND CHEST - 3+ VIEW   COMPARISON:  Chest x-ray with ribs 12/07/2019.   FINDINGS: There are healed left fifth and eighth rib fractures. No acute fractures are seen.   There is no evidence of pneumothorax or pleural effusion. Both lungs are clear. Heart size and mediastinal contours are within normal limits.   IMPRESSION: No acute rib fractures.  No acute cardiopulmonary process.     Electronically Signed   By: Tina Griffiths.D.  On: 04/30/2021 21:45  I, [name], personally (independently) visualized and performed the interpretation of the images attached in this note.    Assessment and Plan: 76 y.o. female with left lateral chest wall pain.  Etiology remains somewhat unclear.  I still think it is possible that she has thoracic radiculopathy despite no obvious nerve compression on her thoracic MRI.  I think it is possible that the  T6 or T5 nerve root was injured during an attempt at medial branch block in August 2020. Additionally another source of pain could be intercostal nerve irritation from the rib fractures that she had previously that are now chronic and healed. Lastly source of pain could be muscle dysfunction again after the traumatic rib fractures and traumatic pneumothorax.  Regardless she is done a fair amount of conservative management and anything beyond what she is done now would be invasive or risky to cause worsening symptoms including potential epidural steroid injection which she is not willing to consider.  Fortunately she has had reasonable success with duloxetine in an attempt to try improving her fibromyalgia pain.  She feels overall improved with 20 mg of duloxetine in the morning.  She notes this combined with occasional gabapentin does make her a bit sedated.  I recommend trying to take it at bedtime.  She asked if a higher dose would work better and I think it could.  Plan: Duloxetine 30 mg at bedtime and recheck in 2 months.  Total encounter time 40 minutes including face-to-face time with the patient and, reviewing past medical record, and charting on the date of service.   Extensive discussion and chart review and planning.  PDMP not reviewed this encounter. No orders of the defined types were placed in this encounter.  Meds ordered this encounter  Medications   DULoxetine (CYMBALTA) 30 MG capsule    Sig: Take 1 capsule (30 mg total) by mouth daily.    Dispense:  30 capsule    Refill:  2     Discussed warning signs or symptoms. Please see discharge instructions. Patient expresses understanding.   The above documentation has been reviewed and is accurate and complete [name], M.D.

## 2021-05-08 ENCOUNTER — Other Ambulatory Visit: Payer: Self-pay

## 2021-05-08 ENCOUNTER — Ambulatory Visit (INDEPENDENT_AMBULATORY_CARE_PROVIDER_SITE_OTHER): Payer: Medicare Other | Admitting: Family Medicine

## 2021-05-08 ENCOUNTER — Encounter: Payer: Self-pay | Admitting: Family Medicine

## 2021-05-08 VITALS — BP 120/70 | HR 75 | Ht 60.0 in | Wt 132.8 lb

## 2021-05-08 DIAGNOSIS — R0781 Pleurodynia: Secondary | ICD-10-CM

## 2021-05-08 DIAGNOSIS — M5414 Radiculopathy, thoracic region: Secondary | ICD-10-CM | POA: Diagnosis not present

## 2021-05-08 MED ORDER — DULOXETINE HCL 30 MG PO CPEP: 30.0000 mg | ORAL_CAPSULE | Freq: Every day | ORAL | 2 refills | Status: DC

## 2021-05-08 NOTE — Patient Instructions (Addendum)
Good to see you.  Increase Cymbalta to 30 mg at bedtime.  Follow-up: 2 months

## 2021-06-06 ENCOUNTER — Telehealth: Payer: Self-pay | Admitting: Physician Assistant

## 2021-06-06 NOTE — Telephone Encounter (Signed)
[facility]  ?

## 2021-06-06 NOTE — Telephone Encounter (Signed)
.. ?  Encourage patient to contact the pharmacy for refills or they can request refills through [facility] ? ?LAST APPOINTMENT DATE:  04/23/21 ? ?NEXT APPOINTMENT DATE: none on file ? ?MEDICATION: ? ?Is the patient out of medication? No - enough for 2 weeks ? ?[facility]  ? ?Let patient know to contact pharmacy at the end of the day to make sure medication is ready. ? ?Please notify patient to allow 48-72 hours to process  ?

## 2021-06-12 ENCOUNTER — Other Ambulatory Visit: Payer: Self-pay | Admitting: Physician Assistant

## 2021-06-12 NOTE — Telephone Encounter (Signed)
.. ?  Encourage patient to contact the pharmacy for refills or they can request refills through [facility] ? ?LAST APPOINTMENT DATE:  04/23/21 ? ?NEXT APPOINTMENT DATE: N/A ? ?MEDICATION:levothyroxine (SYNTHROID) 75 MCG tablet ? ?levothyroxine (SYNTHROID) 88 MCG tablet ? ?meloxicam (MOBIC) 7.5 MG tablet ? ?Is the patient out of medication? no ? ?PHARMACY: ?[place], [place] Phone:  [phone]  ?Fax:  [phone]  ?  ? ? ?Let patient know to contact pharmacy at the end of the day to make sure medication is ready. ? ?Please notify patient to allow 48-72 hours to process  ?

## 2021-06-13 MED ORDER — MELOXICAM 7.5 MG PO TABS: 7.5000 mg | ORAL_TABLET | Freq: Every day | ORAL | 3 refills | Status: DC

## 2021-06-13 MED ORDER — LEVOTHYROXINE SODIUM 75 MCG PO TABS: 75.0000 ug | ORAL_TABLET | ORAL | 1 refills | Status: DC

## 2021-06-13 MED ORDER — LEVOTHYROXINE SODIUM 88 MCG PO TABS: 88.0000 ug | ORAL_TABLET | ORAL | 1 refills | Status: DC

## 2021-07-02 NOTE — Progress Notes (Signed)
? ?I, Wendy Poet, LAT, ATC, am serving as scribe for Dr. [name]. ? ?Heather Hodges is a 76 y.o. female who presents to [place] at [facility] today for f/u of L-sided rib pain, thoracic back pain due to T3 and T6 compression fractures and chronic neck pain.  She was last seen by Dr. Georgina Snell on 05/08/21  and noted no change in her symptoms.  She was advised to increase her Duloxetine dosage to 30 mg at bedtime.  She's completed  prior course of PT for neck and mid-back pain in Sept 2022.  Today, pt reports that her L ribs and T-spine remain unchanged.  More recently she has been having R-sided neck and scapular pain since last week.  She has been using a massage gun. ? ?Diagnostic testing: T-spine MRI- 05/06/21; Chest/rib XR- 04/30/21; C-spine MRI- 02/16/21; C-spine XR- 12/05/20 ? ?Pertinent review of systems: No fevers or chills ? ?Relevant historical information: History of traumatic pneumothorax and history of a cerebral contusion. ? ? ?Exam:  ?BP 110/70 (BP Location: Right Arm, Patient Position: Sitting, Cuff Size: Normal)   Pulse 89   Ht 5' (1.524 m)   Wt 132 lb 6.4 oz (60.1 kg)   SpO2 93%   BMI 25.86 kg/m?  ?General: Well Developed, well nourished, and in no acute distress.  ? ?MSK: C-spine nontender midline  ?Tender to palpation paraspinal musculature bilaterally. ?Decreased cervical motion. ?Upper extremity strength is intact. ?T-spine decreased thoracic motion. ? ? ? ?Lab and Radiology Results ?EXAM: ?MRI CERVICAL SPINE WITHOUT CONTRAST ?  ?TECHNIQUE: ?Multiplanar, multisequence MR imaging of the cervical spine was ?performed. No intravenous contrast was administered. ?  ?COMPARISON:  Cervical spine radiographs 02/04/2021 ?  ?FINDINGS: ?Alignment: Insert minimal anterolisthesis is present at C7-T1. No ?significant listhesis is present in the cervical spine otherwise. ?Mild straightening of the normal cervical lordosis is stable. ?  ?Vertebrae: Chronic endplate marrow changes again  noted at C4-5, C5-6 ?and C6-7. ?  ?Cord: Normal signal and morphology. ?  ?Posterior Fossa, vertebral arteries, paraspinal tissues: ?Craniocervical junction is normal. Flow is present in the vertebral ?arteries bilaterally. Visualized intracranial contents are normal. ?  ?Disc levels: ?  ?C2-3: Asymmetric right-sided facet hypertrophy results in mild right ?foraminal narrowing. The central canal is patent. ?  ?C3-4: Mild facet hypertrophy present bilaterally. Mild bilateral ?foraminal narrowing noted. The central canal is patent. ?  ?C4-5: Uncovertebral spurring is worse on the left. Moderate ?foraminal narrowing is worse left than right. Partial effacement of ?ventral CSF is noted. ?  ?C5-6: A central disc protrusion effaces the ventral CSF and slightly ?distorts the ventral surface cord. No abnormal signal is present. ?Moderate left foraminal stenosis is present. The right foramen is ?patent. ?  ?C6-7: Mild left-sided uncovertebral spurring and foraminal narrowing ?is present. A shallow central disc protrusion is present. ?  ?C7-T1: Moderate facet hypertrophy is worse left than right. No ?significant stenosis is present. ?  ?IMPRESSION: ?1. Multilevel spondylosis of the cervical spine as described. ?2. Mild right foraminal narrowing at C2-3 due to facet spurring. ?3. Mild bilateral foraminal narrowing at C3-4 due to facet spurring. ?4. Moderate foraminal narrowing bilaterally at C4-5 is worse on the ?left secondary to uncovertebral disease. Mild central canal stenosis ?is present. ?5. Moderate left foraminal stenosis at C5-6. ?6. Moderate central canal stenosis at C5-6 with contact and ?distortion of the ventral surface of the cord. ?7. Mild left-sided uncovertebral spurring and foraminal narrowing at ?C6-7. ?  ?  ?Electronically  Signed ?  By: San Morelle M.D. ?  On: 02/18/2021 13:59 ?  ?EXAM: ?MRI THORACIC SPINE WITHOUT CONTRAST ?  ?TECHNIQUE: ?Multiplanar, multisequence MR imaging of the thoracic spine  was ?performed. No intravenous contrast was administered. ?  ?COMPARISON:  None. ?  ?FINDINGS: ?Alignment:  Trace degenerative anterolisthesis at T2-T3. ?  ?Vertebrae: There are chronic anterior compression deformities of T3 ?and T6 with 20% and 45% height loss respectively. No evidence of ?progressive height loss. There is no acute thoracic spine fracture. ?No aggressive osseous lesion. ?  ?Cord: No abnormal cord signal. ?  ?Paraspinal and other soft tissues: Negative ?  ?Disc levels: Mild central disc bulging at T8-T9, T10-T11, and ?T11-T12 results in no significant spinal canal stenosis. No neural ?foraminal stenosis at any level. There is a perineural cyst on the ?left at T10-T11 and T12-L1. ?  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 No evidence of progressive height ?loss. ?  ?No significant spinal canal or neural foraminal stenosis in the ?thoracic spine. ?  ?  ?Electronically Signed ?  By: Maurine Simmering M.D. ?  On: 05/06/2021 16:35 ? ?I, [name], personally (independently) visualized and performed the interpretation of the images attached in this note. ? ? ? ? ? ? ?Assessment and Plan: ?76 y.o. female with chronic pain syndrome with acute exacerbation of lateral neck pain and headache.  I do believe her exacerbation of her chronic neck pain is due to muscle spasm and dysfunction.  I think she also has occipital neuralgia.  We spent time talking about these diagnoses today.  Most reasonable neck step would be physical therapy.  She is reluctant to consider physical therapy at this time.  We discussed some simple pragmatic treatment options including heating pad and Tylenol arthritis which she thinks is a better option for now.  If needed in the future she will let me know and I can order physical therapy.  Ultimately could try referral to [facility] for injection at the occipital nerve or even spine injections.  She is very reluctant to consider  spine injections. ? ?Chronic thoracic pain thought to be due to potential injury of thoracic nerve root during attempted facet injection in the past.  Pain is present but somewhat manageable with Cymbalta and Tylenol.  Plan to continue current regimen.  Certainly could try an epidural steroid injection in the future but she is reluctant to consider this. ? ?Chronic pain syndrome improved with Cymbalta.  Recommend continued Tylenol.  Recommend against frequent NSAIDs. ? ? ?Total encounter time 30 minutes including face-to-face time with the patient and, reviewing past medical record, and charting on the date of service.   ?Treatment plan and options ? ? ?Discussed warning signs or symptoms. Please see discharge instructions. Patient expresses understanding. ? ? ?The above documentation has been reviewed and is accurate and complete [name], M.D. ? ? ?

## 2021-07-03 ENCOUNTER — Encounter: Payer: Self-pay | Admitting: Family Medicine

## 2021-07-03 ENCOUNTER — Ambulatory Visit (INDEPENDENT_AMBULATORY_CARE_PROVIDER_SITE_OTHER): Payer: Medicare Other | Admitting: Family Medicine

## 2021-07-03 VITALS — BP 110/70 | HR 89 | Ht 60.0 in | Wt 132.4 lb

## 2021-07-03 DIAGNOSIS — M5414 Radiculopathy, thoracic region: Secondary | ICD-10-CM

## 2021-07-03 DIAGNOSIS — R0781 Pleurodynia: Secondary | ICD-10-CM

## 2021-07-03 DIAGNOSIS — G8929 Other chronic pain: Secondary | ICD-10-CM

## 2021-07-03 DIAGNOSIS — M797 Fibromyalgia: Secondary | ICD-10-CM | POA: Diagnosis not present

## 2021-07-03 DIAGNOSIS — M542 Cervicalgia: Secondary | ICD-10-CM

## 2021-07-03 NOTE — Patient Instructions (Addendum)
Good to see you today. ? ?Let me know if/when you'd like to proceed w/ an injection or PT (knee, spine, etc). ? ?Follow-up: as needed ?

## 2021-07-22 ENCOUNTER — Telehealth: Payer: Self-pay | Admitting: Physician Assistant

## 2021-07-22 MED ORDER — ATORVASTATIN CALCIUM 40 MG PO TABS: 40.0000 mg | ORAL_TABLET | Freq: Every day | ORAL | 1 refills | Status: DC

## 2021-07-22 NOTE — Telephone Encounter (Signed)
.. ?  Encourage patient to contact the pharmacy for refills or they can request refills through [facility] ? ?LAST APPOINTMENT DATE:   ?04/23/21 ? ?NEXT APPOINTMENT DATE: ?N/a  ? ?MEDICATION: ?atorvastatin (LIPITOR) 40 MG tablet [[id number]]  ? ?Is the patient out of medication?  ?Will run out on Aug 02, 2021 ? ?PHARMACY: ?[place], [place]  ?[address] [place] [address]  ?Phone:  [phone]  Fax:  [phone]  ?DEA #:  -- ?DAW Reason: -- ? ? ? ?Let patient know to contact pharmacy at the end of the day to make sure medication is ready. ? ?Please notify patient to allow 48-72 hours to process  ?

## 2021-07-22 NOTE — Telephone Encounter (Signed)
Left detailed message on personal voicemail Rx for Atorvastatin was sent to [facility] as requested. Any questions please call office. ?

## 2021-07-27 ENCOUNTER — Other Ambulatory Visit: Payer: Self-pay | Admitting: Family Medicine

## 2021-09-10 ENCOUNTER — Encounter: Payer: Medicare Other | Admitting: Physician Assistant

## 2021-09-16 ENCOUNTER — Ambulatory Visit (INDEPENDENT_AMBULATORY_CARE_PROVIDER_SITE_OTHER): Payer: Medicare Other | Admitting: Physician Assistant

## 2021-09-16 ENCOUNTER — Encounter: Payer: Self-pay | Admitting: Physician Assistant

## 2021-09-16 VITALS — BP 120/68 | HR 73 | Temp 97.7°F | Ht 60.0 in | Wt 133.0 lb

## 2021-09-16 DIAGNOSIS — M542 Cervicalgia: Secondary | ICD-10-CM

## 2021-09-16 DIAGNOSIS — E039 Hypothyroidism, unspecified: Secondary | ICD-10-CM

## 2021-09-16 DIAGNOSIS — Z1159 Encounter for screening for other viral diseases: Secondary | ICD-10-CM | POA: Diagnosis not present

## 2021-09-16 DIAGNOSIS — Z0000 Encounter for general adult medical examination without abnormal findings: Secondary | ICD-10-CM | POA: Diagnosis not present

## 2021-09-16 DIAGNOSIS — M1990 Unspecified osteoarthritis, unspecified site: Secondary | ICD-10-CM

## 2021-09-16 DIAGNOSIS — M797 Fibromyalgia: Secondary | ICD-10-CM | POA: Diagnosis not present

## 2021-09-16 DIAGNOSIS — E785 Hyperlipidemia, unspecified: Secondary | ICD-10-CM | POA: Diagnosis not present

## 2021-09-16 DIAGNOSIS — Z1211 Encounter for screening for malignant neoplasm of colon: Secondary | ICD-10-CM

## 2021-09-16 DIAGNOSIS — G8929 Other chronic pain: Secondary | ICD-10-CM

## 2021-09-16 LAB — COMPREHENSIVE METABOLIC PANEL
ALT: 22 U/L (ref 0–35)
AST: 23 U/L (ref 0–37)
Albumin: 4 g/dL (ref 3.5–5.2)
Alkaline Phosphatase: 56 U/L (ref 39–117)
BUN: 9 mg/dL (ref 6–23)
CO2: 29 mEq/L (ref 19–32)
Calcium: 9 mg/dL (ref 8.4–10.5)
Chloride: 101 mEq/L (ref 96–112)
Creatinine, Ser: 0.79 mg/dL (ref 0.40–1.20)
GFR: 72.24 mL/min (ref >60.00)
Glucose, Bld: 85 mg/dL (ref 70–99)
Potassium: 4.3 mEq/L (ref 3.5–5.1)
Sodium: 136 mEq/L (ref 135–145)
Total Bilirubin: 0.6 mg/dL (ref 0.2–1.2)
Total Protein: 6.9 g/dL (ref 6.0–8.3)

## 2021-09-16 LAB — CBC WITH DIFFERENTIAL/PLATELET
Basophils Absolute: 0 10*3/uL (ref 0.0–0.1)
Basophils Relative: 0.4 % (ref 0.0–3.0)
Eosinophils Absolute: 0.1 10*3/uL (ref 0.0–0.7)
Eosinophils Relative: 0.9 % (ref 0.0–5.0)
HCT: 36.2 % (ref 36.0–46.0)
Hemoglobin: 12.2 g/dL (ref 12.0–15.0)
Lymphocytes Relative: 35.5 % (ref 12.0–46.0)
Lymphs Abs: 2.5 10*3/uL (ref 0.7–4.0)
MCHC: 33.6 g/dL (ref 30.0–36.0)
MCV: 92.7 fl (ref 78.0–100.0)
Monocytes Absolute: 0.4 10*3/uL (ref 0.1–1.0)
Monocytes Relative: 6.3 % (ref 3.0–12.0)
Neutro Abs: 4 10*3/uL (ref 1.4–7.7)
Neutrophils Relative %: 56.9 % (ref 43.0–77.0)
Platelets: 243 10*3/uL (ref 150.0–400.0)
RBC: 3.91 Mil/uL (ref 3.87–5.11)
RDW: 13.5 % (ref 11.5–15.5)
WBC: 7 10*3/uL (ref 4.0–10.5)

## 2021-09-16 LAB — LIPID PANEL
Cholesterol: 215 mg/dL — ABNORMAL HIGH (ref 0–200)
HDL: 57 mg/dL (ref >39.00)
NonHDL: 157.71
Total CHOL/HDL Ratio: 4
Triglycerides: 240 mg/dL — ABNORMAL HIGH (ref 0.0–149.0)
VLDL: 48 mg/dL — ABNORMAL HIGH (ref 0.0–40.0)

## 2021-09-16 LAB — TSH: TSH: 0.47 u[IU]/mL (ref 0.35–5.50)

## 2021-09-16 LAB — LDL CHOLESTEROL, DIRECT: Direct LDL: 116 mg/dL

## 2021-09-16 NOTE — Progress Notes (Signed)
Subjective:    Heather Hodges is a 76 y.o. female and is here for a comprehensive physical exam.   HPI  Health Maintenance Due  Topic Date Due   Hepatitis C Screening  Never done    Acute Concerns: None  Chronic Issues: Arthritis  Patient states she still has been dealing with ongoing pain. She has increased pain and feel achy all the time. She states she has had tried PT in the past. She states this has not helped. She is currently taking Meloxicam 7.5 mg daily for her pain. However, she still has been having increased pain. States she is unable to drive due to pain. Denies any worsening symptoms.   Fibromyalgia  Patient states she continues to have pain. She is currently taking Gabapentin 100 mg 3 times daily and Cymbalta 30 mg daily with no adverse side effects. Tolerating her medication well.   Chronic neck pain  Patient still has been experiencing neck pain. She takes Duloxetine 30 mg daily with no complication. She has tried PT in the past but states this has made her pain worse. She follows up with Dr. Georgina Snell and is managing well.   Hypothyroidism Currently feels well controlled. She takes 88 mcg every other day and 75 mcg levothyroxine every other day. Tolerating well.  HLD Currently taking lipitor 40 mg daily. Denies myalgias associated with this medication.  Health Maintenance: Immunizations -- UTD Colonoscopy -- not UTD, interested in colonoscopy Mammogram -- UTD PAP -- N/A Bone Density -- UTD Diet -- Balanced  Sleep habits -- No concern Exercise -- None specific  Weight -- 133 Ib (60.3 kg) Mood -- Stable Weight history: Wt Readings from Last 10 Encounters:  09/16/21 133 lb (60.3 kg)  07/03/21 132 lb 6.4 oz (60.1 kg)  05/08/21 132 lb 12.8 oz (60.2 kg)  04/30/21 131 lb (59.4 kg)  04/23/21 130 lb 4 oz (59.1 kg)  02/11/21 132 lb 12.8 oz (60.2 kg)  01/29/21 131 lb 12.8 oz (59.8 kg)  12/12/20 129 lb 6.1 oz (58.7 kg)  12/10/20 131 lb (59.4 kg)  12/05/20  130 lb (59 kg)   Body mass index is 25.97 kg/m. No LMP recorded. Patient is postmenopausal. Alcohol use:  reports no history of alcohol use. Tobacco use:  Tobacco Use: Low Risk  (09/16/2021)   Patient History    Smoking Tobacco Use: Never    Smokeless Tobacco Use: Never    Passive Exposure: Not on file        11/30/2020    8:10 AM  Depression screen PHQ 2/9  Decreased Interest 0  Down, Depressed, Hopeless 0  PHQ - 2 Score 0     Other providers/specialists: Patient Care Team: Inda Coke, [place] as PCP - General (Physician Assistant) Celene Squibb., MD as Referring Physician (Neurology) Lavonna Monarch, MD as Consulting Physician (Dermatology)    PMHx, SurgHx, SocialHx, Medications, and Allergies were reviewed in the Visit Navigator and updated as appropriate.   Past Medical History:  Diagnosis Date   Arthritis    Colon polyps    Diverticulosis    History of chicken pox    Hyperlipidemia    Hypothyroidism    Osteoporosis    Vitamin D deficiency      Past Surgical History:  Procedure Laterality Date   BREAST BIOPSY Left    Benign   TONSILLECTOMY AND ADENOIDECTOMY       Family History  Problem Relation Age of Onset   Arthritis Mother  Breast cancer Mother        48s-70s   High Cholesterol Mother    Heart attack Father    High Cholesterol Father    Arthritis Brother    Hearing loss Brother    Hypercholesterolemia Brother     Social History   Tobacco Use   Smoking status: Never   Smokeless tobacco: Never  Substance Use Topics   Alcohol use: Never   Drug use: Never    Review of Systems:   Review of Systems  Constitutional:  Negative for chills, fever, malaise/fatigue and weight loss.  HENT:  Negative for hearing loss, sinus pain and sore throat.   Respiratory:  Negative for cough and hemoptysis.   Cardiovascular:  Negative for chest pain, palpitations, leg swelling and PND.  Gastrointestinal:  Negative for abdominal pain, constipation,  diarrhea, heartburn, nausea and vomiting.  Genitourinary:  Negative for dysuria, frequency and urgency.  Musculoskeletal:  Negative for back pain, myalgias and neck pain.  Skin:  Negative for itching and rash.  Neurological:  Negative for dizziness, tingling, seizures and headaches.  Endo/Heme/Allergies:  Negative for polydipsia.  Psychiatric/Behavioral:  Negative for depression. The patient is not nervous/anxious.     Objective:   BP 120/68 (BP Location: Left Arm, Patient Position: Sitting, Cuff Size: Normal)   Pulse 73   Temp 97.7 F (36.5 C) (Temporal)   Ht 5' (1.524 m)   Wt 133 lb (60.3 kg)   SpO2 95%   BMI 25.97 kg/m  Body mass index is 25.97 kg/m.   General Appearance:    Alert, cooperative, no distress, appears stated age  Head:    Normocephalic, without obvious abnormality, atraumatic  Eyes:    PERRL, conjunctiva/corneas clear, EOM's intact, fundi    benign, both eyes  Ears:    Normal TM's and external ear canals, both ears  Nose:   Nares normal, septum midline, mucosa normal, no drainage    or sinus tenderness  Throat:   Lips, mucosa, and tongue normal; teeth and gums normal  Neck:   Supple, symmetrical, trachea midline, no adenopathy;    thyroid:  no enlargement/tenderness/nodules; no carotid   bruit or JVD  Back:     Symmetric, no curvature, ROM normal, no CVA tenderness  Lungs:     Clear to auscultation bilaterally, respirations unlabored  Chest Wall:    No tenderness or deformity   Heart:    Regular rate and rhythm, S1 and S2 normal, no murmur, rub or gallop  Breast Exam:    Deferred  Abdomen:     Soft, non-tender, bowel sounds active all four quadrants,    no masses, no organomegaly  Genitalia:    Deferred  Extremities:   Extremities normal, atraumatic, no cyanosis or edema  Pulses:   2+ and symmetric all extremities  Skin:   Skin color, texture, turgor normal, no rashes or lesions  Lymph nodes:   Cervical, supraclavicular, and axillary nodes normal   Neurologic:   CNII-XII intact, normal strength, sensation and reflexes    throughout    Assessment/Plan:   Routine physical examination Today patient counseled on age appropriate routine health concerns for screening and prevention, each reviewed and up to date or declined. Immunizations reviewed and up to date or declined. Labs ordered and reviewed. Risk factors for depression reviewed and negative. Hearing function and visual acuity are intact. ADLs screened and addressed as needed. Functional ability and level of safety reviewed and appropriate. Education, counseling and referrals performed based on assessed  risks today. Patient provided with a copy of personalized plan for preventive services.  Acquired hypothyroidism Update TSH and adjust 88 mcg and 75 mcg alternating levothyroxine daily Follow-up based on results  Hyperlipidemia, unspecified hyperlipidemia type Update lipid panel and adjust lipitor 40 mg daily as indicated  Encounter for screening for other viral diseases Update Hep C  Fibromyalgia; Arthritis; Chronic neck pain Overall controlled Recommend continued cymbalta 30 mg daily, mobic 7/5 mg daily, and extra strength tylenol prn Sports medicine available prn  Special screening for malignant neoplasms, colon Referral sent  Patient Counseling: '[x]'$    Nutrition: Stressed importance of moderation in sodium/caffeine intake, saturated fat and cholesterol, caloric balance, sufficient intake of fresh fruits, vegetables, fiber, calcium, iron, and 1 mg of folate supplement per day (for females capable of pregnancy).  '[x]'$    Stressed the importance of regular exercise.   '[x]'$    Substance Abuse: Discussed cessation/primary prevention of tobacco, alcohol, or other drug use; driving or other dangerous activities under the influence; availability of treatment for abuse.   '[x]'$    Injury prevention: Discussed safety belts, safety helmets, smoke detector, smoking near bedding or upholstery.    '[x]'$    Sexuality: Discussed sexually transmitted diseases, partner selection, use of condoms, avoidance of unintended pregnancy  and contraceptive alternatives.  '[x]'$    Dental health: Discussed importance of regular tooth brushing, flossing, and dental visits.  '[x]'$    Health maintenance and immunizations reviewed. Please refer to Health maintenance section.    I,Savera Zaman,acting as a Education administrator for [employer], PA.,have documented all relevant documentation on the behalf of Inda Coke, PA,as directed by  Inda Coke, PA while in the presence of Inda Coke, [place].   I, Inda Coke, [place], have reviewed all documentation for this visit. The documentation on 09/16/21 for the exam, diagnosis, procedures, and orders are all accurate and complete.  Inda Coke, PA-C [facility]

## 2021-09-16 NOTE — Patient Instructions (Addendum)
It was great to see you!  Referral for colonoscopy placed today  Please go to the lab for blood work.   Our office will call you with your results unless you have chosen to receive results via MyChart.  If your blood work is normal we will follow-up each year for physicals and as scheduled for chronic medical problems.  If anything is abnormal we will treat accordingly and get you in for a follow-up.  Take care,  Aldona Bar

## 2021-09-17 ENCOUNTER — Telehealth: Payer: Self-pay | Admitting: *Deleted

## 2021-09-17 ENCOUNTER — Telehealth: Payer: Self-pay | Admitting: Physician Assistant

## 2021-09-17 ENCOUNTER — Other Ambulatory Visit: Payer: Self-pay | Admitting: *Deleted

## 2021-09-17 LAB — HEPATITIS C ANTIBODY: Hepatitis C Ab: NONREACTIVE

## 2021-09-17 MED ORDER — ATORVASTATIN CALCIUM 80 MG PO TABS: 80.0000 mg | ORAL_TABLET | Freq: Every day | ORAL | 3 refills | Status: DC

## 2021-09-17 NOTE — Telephone Encounter (Signed)
Pt states she has questions about vm received about results.

## 2021-09-17 NOTE — Telephone Encounter (Signed)
See other telephone encounter.

## 2021-09-17 NOTE — Telephone Encounter (Signed)
Left message on voicemail to call office.  

## 2021-09-17 NOTE — Telephone Encounter (Signed)
Pt called back, had questions about cholesterol, which one was elevated. Told her LDL which is bad cholesterol was elevated at 116 and Samantha would like to get it to 100, that is why she is increasing for lipitor medication. Pt verbalized understanding and just forgot to write it down. Told her okay.

## 2021-09-20 ENCOUNTER — Telehealth: Payer: Self-pay | Admitting: Family Medicine

## 2021-09-20 ENCOUNTER — Other Ambulatory Visit: Payer: Self-pay

## 2021-09-20 MED ORDER — GABAPENTIN 100 MG PO CAPS
200.0000 mg | ORAL_CAPSULE | Freq: Three times a day (TID) | ORAL | 1 refills | Status: DC
Start: 2021-09-20 — End: 2022-03-26

## 2021-09-20 NOTE — Telephone Encounter (Signed)
Rx refill sent to [facility] per Dr. Zollie Pee standing orders

## 2021-10-23 ENCOUNTER — Other Ambulatory Visit: Payer: Self-pay | Admitting: Family Medicine

## 2021-11-11 ENCOUNTER — Telehealth: Payer: Self-pay | Admitting: Internal Medicine

## 2021-11-11 NOTE — Telephone Encounter (Signed)
Hi Dr. Lorenso Courier,  This patient is requesting a transfer of care to you.  She has changed her PCP to Dr. Morene Rankins in the [facility] system and wants to have all of her doctors with [facility].  Dr. Morene Rankins referred her to [place] for colonoscopy.  Patient is 76 years old, not having any current GI issues, is not on blood thinners, and is not diabetic.  She was most recently seen at [facility].  I tried to pull records from [place] as it shows her last colon was 11/06/15, but could not pull a report.  Contacted patient to let her know we needed those records faxed here for your review.  Once records are received, we will forward them to you.

## 2021-11-13 ENCOUNTER — Ambulatory Visit (INDEPENDENT_AMBULATORY_CARE_PROVIDER_SITE_OTHER): Payer: Medicare Other | Admitting: Physician Assistant

## 2021-11-13 ENCOUNTER — Encounter: Payer: Self-pay | Admitting: Physician Assistant

## 2021-11-13 VITALS — BP 110/60 | HR 74 | Temp 97.9°F | Ht 60.0 in | Wt 131.5 lb

## 2021-11-13 DIAGNOSIS — M1990 Unspecified osteoarthritis, unspecified site: Secondary | ICD-10-CM | POA: Diagnosis not present

## 2021-11-13 MED ORDER — MELOXICAM 15 MG PO TABS: 15.0000 mg | ORAL_TABLET | Freq: Every day | ORAL | 0 refills | Status: DC

## 2021-11-13 NOTE — Progress Notes (Signed)
Heather Hodges is a 76 y.o. female here for a follow up of a pre-existing problem.  History of Present Illness:   Chief Complaint  Patient presents with   Pain    Pt is c/o pain from head to toe, mainly right side.    HPI  Arthritis In July was at a camp and felt like she over-did it with physical activity. She has had worsening of her arthritis due to this. Has R shoulder pain, R index finger pain, R ankle pain. She has her ongoing L thoracic pain as well. She is wondering if she could use a medication adjustment.  She is currently taking: gabapentin 200 mg TID, cymbalta 30 mg, tylenol arthritis and mobic 7.5 mg daily  Denies: decreased ROM, significant swelling at joints, weakness/numbness/tingling   Past Medical History:  Diagnosis Date   Arthritis    Colon polyps    Diverticulosis    History of chicken pox    Hyperlipidemia    Hypothyroidism    Osteoporosis    Vitamin D deficiency      Social History   Tobacco Use   Smoking status: Never   Smokeless tobacco: Never  Substance Use Topics   Alcohol use: Never   Drug use: Never    Past Surgical History:  Procedure Laterality Date   BREAST BIOPSY Left    Benign   TONSILLECTOMY AND ADENOIDECTOMY      Family History  Problem Relation Age of Onset   Arthritis Mother    Breast cancer Mother        81s-70s   High Cholesterol Mother    Heart attack Father    High Cholesterol Father    Arthritis Brother    Hearing loss Brother    Hypercholesterolemia Brother     Allergies  Allergen Reactions   Risedronate Sodium Other (See Comments)    TMJ dysfunction    Current Medications:   Current Outpatient Medications:    acetaminophen (TYLENOL 8 HOUR ARTHRITIS PAIN) 650 MG CR tablet, Take 650 mg by mouth every 8 (eight) hours as needed for pain., Disp: , Rfl:    atorvastatin (LIPITOR) 80 MG tablet, Take 1 tablet (80 mg total) by mouth daily., Disp: 90 tablet, Rfl: 3   b complex vitamins capsule, Take 1 capsule  by mouth every other day., Disp: , Rfl:    Calcium Carbonate-Vitamin D (CALCIUM 500+D PO), Take 2 each by mouth every other day., Disp: , Rfl:    DULoxetine (CYMBALTA) 30 MG capsule, TAKE 1 CAPSULE BY MOUTH EVERY DAY, Disp: 30 capsule, Rfl: 2   gabapentin (NEURONTIN) 100 MG capsule, Take 2 capsules (200 mg total) by mouth 3 (three) times daily., Disp: 540 capsule, Rfl: 1   ketoconazole (NIZORAL) 2 % cream, Apply to affected area 1-2 times daily, Disp: 15 g, Rfl: 0   levothyroxine (SYNTHROID) 75 MCG tablet, Take 1 tablet (75 mcg total) by mouth every other day., Disp: 90 tablet, Rfl: 1   levothyroxine (SYNTHROID) 88 MCG tablet, Take 1 tablet (88 mcg total) by mouth every other day., Disp: 90 tablet, Rfl: 1   lidocaine (XYLOCAINE) 4 % external solution, Apply topically., Disp: , Rfl:    Lidocaine 4 % PTCH, Place onto the skin., Disp: , Rfl:    meloxicam (MOBIC) 7.5 MG tablet, Take 1 tablet (7.5 mg total) by mouth daily., Disp: 90 tablet, Rfl: 3   Omega-3 Fatty Acids (OMEGA 3 500 PO), Take 1 capsule by mouth daily in the afternoon., Disp: ,  Rfl:    OVER THE COUNTER MEDICATION, Apply topically as needed. Hempvana, Disp: , Rfl:    SYSTANE ULTRA 0.4-0.3 % SOLN, SMARTSIG:1 Drop(s) In Eye(s) PRN, Disp: , Rfl:    Review of Systems:   ROS Negative unless otherwise specified per HPI.    Vitals:   Vitals:   11/13/21 1056  BP: 110/60  Pulse: 74  Temp: 97.9 F (36.6 C)  TempSrc: Temporal  SpO2: 95%  Weight: 131 lb 8 oz (59.6 kg)  Height: 5' (1.524 m)     Body mass index is 25.68 kg/m.  Physical Exam:   Physical Exam Vitals and nursing note reviewed.  Constitutional:      General: She is not in acute distress.    Appearance: She is well-developed. She is not ill-appearing or toxic-appearing.  Cardiovascular:     Rate and Rhythm: Normal rate and regular rhythm.     Pulses: Normal pulses.     Heart sounds: Normal heart sounds, S1 normal and S2 normal.  Pulmonary:     Effort: Pulmonary  effort is normal.     Breath sounds: Normal breath sounds.  Skin:    General: Skin is warm and dry.  Neurological:     Mental Status: She is alert.     GCS: GCS eye subscore is 4. GCS verbal subscore is 5. GCS motor subscore is 6.  Psychiatric:        Speech: Speech normal.        Behavior: Behavior normal. Behavior is cooperative.     Assessment and Plan:   Arthritis Uncontrolled Continue gabapentin 200 mg TID, cymbalta 30 mg, tylenol arthritis Increase mobic to 15 mg Follow-up in 3 months -- will update renal function panel Recommend PT, she declines  Inda Coke, PA-C

## 2021-11-13 NOTE — Patient Instructions (Addendum)
It was great to see you!  Continue the gabapentin 200 mg three times daily.  Increase your mobic to 15 mg daily -- you can double up your 7.5 mg while you have this medication at home.   Keep me posted on your symptoms. I would like [facility] -- let me know about this!  Take care,  Inda Coke PA-C

## 2021-12-06 ENCOUNTER — Ambulatory Visit: Payer: Medicare Other

## 2021-12-24 ENCOUNTER — Other Ambulatory Visit: Payer: Self-pay | Admitting: Family Medicine

## 2021-12-24 MED ORDER — DULOXETINE HCL 30 MG PO CPEP: 30.0000 mg | ORAL_CAPSULE | Freq: Every day | ORAL | 3 refills | Status: DC

## 2022-01-07 ENCOUNTER — Ambulatory Visit: Payer: Medicare Other | Admitting: Dermatology

## 2022-01-15 ENCOUNTER — Other Ambulatory Visit: Payer: Self-pay | Admitting: Family Medicine

## 2022-01-24 ENCOUNTER — Other Ambulatory Visit: Payer: Self-pay | Admitting: Physician Assistant

## 2022-03-10 ENCOUNTER — Telehealth: Payer: Self-pay | Admitting: Physician Assistant

## 2022-03-10 NOTE — Telephone Encounter (Signed)
Pt was advised to go to ED but refused. Please advise.   Patient Name: Heather Hodges Gender: Unknown DOB: 1945-08-26 Age: 76 Y 38 M 19 D Return Phone Number: [phone] (Primary) Address: City/ State/ Zip: [place] Client [place] [facility] [place] Client Site [place] [facility] [place] [employer] [place]- [place] Contact Type Call Who Is Calling Patient / Member / Family / Caregiver Call Type Triage / Clinical Relationship To Patient Self Return Phone Number [phone] (Primary) Chief Complaint Knee Injury Reason for Call Symptomatic / Request for Lynn states she fell Thursday face down on an asphalt parking lot. She has significant bruising on her face and injured her knee. Translation No Nurse Assessment Nurse: Curlene Labrum, RN, [place] Date/Time (Eastern Time): 03/08/2022 12:24:41 PM Confirm and document reason for call. If symptomatic, describe symptoms. ---Caller states she fell face down on the asphalt and injured her knee on Thursday and has a goose egg on her head. She called a doctor friend who gave her things to watch for but didn't recommend being seen. Today her knee, ankle, and hand are swollen and tender. Caller states she would like an appointment with her PCP on Monday. Does the patient have any new or worsening symptoms? ---Yes Will a triage be completed? ---Yes Related visit to physician within the last 2 weeks? ---No Does the PT have any chronic conditions? (i.e. diabetes, asthma, this includes High risk factors for pregnancy, etc.) ---Yes List chronic conditions. ---hx of falls, compression in the back Is this a behavioral health or substance abuse call? ---No Guidelines Guideline Title Affirmed Question Affirmed Notes Nurse Date/Time Eilene Ghazi Time) Knee Injury Large swelling or bruise (> 2 inches or 5 cm) Belac, RN, [place] 03/08/2022  12:29:59 PM Guidelines Guideline Title Affirmed Question Affirmed Notes Nurse Date/Time Eilene Ghazi Time) Head Injury [1] One or two "black eyes" (bruising, purple color of eyelids) AND [2] onset within 24 hours of head injury Belac, RN, [place] 03/08/2022 12:39:39 PM Disp. Time Eilene Ghazi Time) Disposition Final User 03/08/2022 12:39:09 PM See PCP within 24 Hours Belac, RN, [place] 03/08/2022 12:52:56 PM Go to ED Now Yes Curlene Labrum, RN, [place] Final Disposition 03/08/2022 12:52:56 PM Go to ED Now Yes Belac, RN, [place] Caller Disagree/Comply Disagree Caller Understands Yes PreDisposition Did not know what to do Care Advice Given Per Guideline SEE PCP WITHIN 24 HOURS: * IF OFFICE WILL BE CLOSED: You need to be seen within the next 24 hours. A clinic or an urgent care center is often a good source of care if your doctor's office is closed or you can't get an appointment. USE A COLD PACK FOR PAIN, SWELLING, OR BRUISING: * Put a cold pack or an ice bag (wrapped in a moist towel) on the area for 20 minutes. * Repeat in 1 hour, then every 4 hours while awake. * For pain relief, you can take either acetaminophen, ibuprofen, or naproxen. PAIN MEDICINES: CALL BACK IF: * Pain becomes severe * You become worse CARE ADVICE given per Knee Injury (Adult) guideline. GO TO ED NOW: * You need to be seen in the Emergency Department. * Go to the ED at ___________ [place] given per Head Injury (Adult) guideline. Comments User: Dimas Chyle, RN Date/Time Eilene Ghazi Time): 03/08/2022 12:44:44 PM Caller states the bump where she hit her head is swollen and above her eye, it is also bruised. Referrals GO TO FACILITY REFUSED

## 2022-03-10 NOTE — Telephone Encounter (Signed)
Please advise 

## 2022-03-11 ENCOUNTER — Encounter: Payer: Self-pay | Admitting: Physician Assistant

## 2022-03-11 ENCOUNTER — Ambulatory Visit (INDEPENDENT_AMBULATORY_CARE_PROVIDER_SITE_OTHER)
Admission: RE | Admit: 2022-03-11 | Discharge: 2022-03-11 | Disposition: A | Discharge status: Home / Self Care | Payer: Medicare Other | Source: Ambulatory Visit | Attending: Physician Assistant | Admitting: Physician Assistant

## 2022-03-11 ENCOUNTER — Ambulatory Visit (INDEPENDENT_AMBULATORY_CARE_PROVIDER_SITE_OTHER): Payer: Medicare Other | Admitting: Physician Assistant

## 2022-03-11 ENCOUNTER — Encounter (HOSPITAL_BASED_OUTPATIENT_CLINIC_OR_DEPARTMENT_OTHER): Payer: Self-pay

## 2022-03-11 ENCOUNTER — Ambulatory Visit (HOSPITAL_BASED_OUTPATIENT_CLINIC_OR_DEPARTMENT_OTHER)
Admission: RE | Admit: 2022-03-11 | Discharge: 2022-03-11 | Disposition: A | Discharge status: Home / Self Care | Payer: Medicare Other | Source: Ambulatory Visit | Attending: Physician Assistant | Admitting: Physician Assistant

## 2022-03-11 VITALS — BP 122/70 | HR 75 | Temp 97.5°F | Ht 60.0 in | Wt 130.5 lb

## 2022-03-11 DIAGNOSIS — M79671 Pain in right foot: Secondary | ICD-10-CM

## 2022-03-11 DIAGNOSIS — M25571 Pain in right ankle and joints of right foot: Secondary | ICD-10-CM

## 2022-03-11 DIAGNOSIS — S060X0A Concussion without loss of consciousness, initial encounter: Secondary | ICD-10-CM | POA: Insufficient documentation

## 2022-03-11 DIAGNOSIS — M25561 Pain in right knee: Secondary | ICD-10-CM

## 2022-03-11 DIAGNOSIS — W19XXXA Unspecified fall, initial encounter: Secondary | ICD-10-CM

## 2022-03-11 DIAGNOSIS — M25531 Pain in right wrist: Secondary | ICD-10-CM | POA: Diagnosis not present

## 2022-03-11 NOTE — Progress Notes (Signed)
Subjective:   I, Heather Hodges, LAT, ATC acting as a scribe for Heather Leader, MD.  Chief Complaint: Heather Hodges,  is a 76 y.o. female who presents for initial evaluation of a head injury. Pt suffered a fall on 12/7, when walking to the [place] store and mis-judging the height of the step. Pt fell forward, landing on her . No LOC. Today, pt c/o cont'd overall body soreness, "tightness" in her head, LBP, R knee, R ankle pain, intermittent HA, and dizziness.  When I asked about dizziness she describes it more of a headache or pressure sensation.  She denies a room spinning sensation.  Dx imaging: 03/11/22 Head CT and R wrist, R knee, R foot, & R ankle XR  Injury date : 03/06/22 Visit #: 1  History of Present Illness:   Concussion Self-Reported Symptom Score Symptoms rated on a scale 1-6, in last 24 hours  Headache: 2   Pressure in head: 4 Neck pain: 2 Nausea or vomiting: 0 Dizziness: 1  Blurred vision: 0  Balance problems: 1 Sensitivity to light:  3 Sensitivity to noise: 6 Feeling slowed down: 6 Feeling like "in a fog": 0 "Don't feel right": 6 Difficulty concentrating: 6 Difficulty remembering: 2 Fatigue or low energy: 6 Confusion: 0 Drowsiness: 4 More emotional: 6 Irritability: 4 Sadness: 3 Nervous or anxious: 2 Trouble falling asleep: 2   Total # of Symptoms: 18/22 Total Symptom Score: 66/132  Tinnitus: No  Review of Systems: No fevers or chills    Review of History: Fibromyalgia treated with duloxetine and gabapentin.  Objective:    Physical Examination Vitals:   03/12/22 0915  BP: 130/84  Pulse: 73  SpO2: 97%   MSK: C-spine normal cervical motion.  Right knee normal motion tender palpation anterior knee.  Intact strength. Right ankle mild effusion mildly tender palpation anterior ankle normal motion and strength. Neuro: Alert and normal coordination and gait.  Reflexes are intact.  Balance slightly impaired. Psych: Normal speech thought  process and affect.      Imaging:  CT HEAD WO CONTRAST (5MM)  Result Date: 03/11/2022 CLINICAL DATA:  [place] 6 days ago with trauma to the head. EXAM: CT HEAD WITHOUT CONTRAST TECHNIQUE: Contiguous axial images were obtained from the base of the skull through the vertex without intravenous contrast. RADIATION DOSE REDUCTION: This exam was performed according to the departmental dose-optimization program which includes automated exposure control, adjustment of the mA and/or kV according to patient size and/or use of iterative reconstruction technique. COMPARISON:  12/14/2020 FINDINGS: Brain: Mild age related volume loss. No evidence of old or acute focal infarction, mass lesion, hemorrhage, hydrocephalus or extra-axial collection. Vascular: There is atherosclerotic calcification of the major vessels at the base of the brain. Skull: Negative.  No traumatic finding. Sinuses/Orbits: Clear/normal Other: None IMPRESSION: No acute or traumatic finding. Mild age related volume loss. Electronically Signed   By: Nelson Chimes M.D.   On: 03/11/2022 14:33   DG Wrist Complete Right  Result Date: 03/11/2022 CLINICAL DATA:  Pain status post fall EXAM: RIGHT WRIST - COMPLETE 4 VIEW COMPARISON:  None Available. FINDINGS: There is no evidence of fracture or dislocation. There is no evidence of arthropathy or other focal bone abnormality. Soft tissues are unremarkable. Well corticated osseous fragment at the ulnar styloid process may represent an old injury. IMPRESSION: No acute osseous abnormality identified. Electronically Signed   By: Sammie Bench M.D.   On: 03/11/2022 14:05   DG Foot Complete Right  Result Date:  03/11/2022 CLINICAL DATA:  Pain EXAM: RIGHT FOOT COMPLETE - 3 VIEW COMPARISON:  None Available. FINDINGS: No acute fracture, dislocation or subluxation. First metatarsophalangeal and first interphalangeal degenerative changes noted with joint space narrowing and osteophytes. There is posterior and  plantar calcaneal spur. No osteolytic or osteoblastic Changes. IMPRESSION: Degenerative changes.  No acute osseous abnormalities. Electronically Signed   By: Sammie Bench M.D.   On: 03/11/2022 14:04   DG Ankle Complete Right  Result Date: 03/11/2022 CLINICAL DATA:  r lateral ankle pain s/p fall EXAM: RIGHT ANKLE - COMPLETE 3 VIEW COMPARISON:  11/14/2020 FINDINGS: There is no evidence of fracture, dislocation, or joint effusion. There is no evidence of arthropathy or other focal bone abnormality. There are posterior and plantar calcaneal spurs. Soft tissue swelling noted at the lateral malleolus. IMPRESSION: Soft tissue swelling. No acute osseous abnormality identified. Electronically Signed   By: Sammie Bench M.D.   On: 03/11/2022 14:02   DG Knee Complete 4 Views Right  Result Date: 03/11/2022 CLINICAL DATA:  r knee pain s/p fall EXAM: RIGHT KNEE - COMPLETE 4 VIEW COMPARISON:  11/14/2020. FINDINGS: No evidence of fracture, dislocation, or joint effusion. There is patellofemoral and medial compartment joint space narrowing and osteophytes consistent with degenerative joint disease. No evidence of effusion. Osseous structures appear osteopenic IMPRESSION: Degenerative changes. Electronically Signed   By: Sammie Bench M.D.   On: 03/11/2022 14:01   I, Heather Hodges, personally (independently) visualized and performed the interpretation of the images attached in this note.   Assessment and Plan   76 y.o. female with fall with concussion and injury to the right knee and ankle  Overall doing reasonably well with mild to moderate symptoms. She already has reasonable medication symptom control currently for her concussion symptoms.  She would like to avoid physical therapy referral for her dizziness/vestibular symptoms and for her orthopedic symptoms for now.  Plan to continue Tylenol and Voltaren gel and proceed with some watchful waiting for the next few weeks.  Recheck in about 3 weeks.       Action/Discussion: Reviewed diagnosis, management options, expected outcomes, and the reasons for scheduled and emergent follow-up. Questions were adequately answered. Patient expressed verbal understanding and agreement with the following plan.     Patient Education: Reviewed with patient the risks (i.e, a repeat concussion, post-concussion syndrome, second-impact syndrome) of returning to play prior to complete resolution, and thoroughly reviewed the signs and symptoms of concussion.Reviewed need for complete resolution of all symptoms, with rest AND exertion, prior to return to play. Reviewed red flags for urgent medical evaluation: worsening symptoms, nausea/vomiting, intractable headache, musculoskeletal changes, focal neurological deficits. [facility] Concussion Care Plan, which clearly outlines the plans stated above, was given to patient.   Level of service: Total encounter time 30 minutes including face-to-face time with the patient and, reviewing past medical record, and charting on the date of service.        After Visit Summary printed out and provided to patient as appropriate.  The above documentation has been reviewed and is accurate and complete Heather Hodges

## 2022-03-11 NOTE — Progress Notes (Addendum)
Heather Hodges is a 76 y.o. female here for a new problem.  History of Present Illness:   Chief Complaint  Patient presents with   Fall    Pt fell on Thursday, she was walking to the store and miss judged the height of the asphalt pt fell face forward. Forehead and nose is ecchymotic.    HPI  Fall Patient reports that she was walking to [employer] 6 days ago and when going up a step, she did not fully clear it and fell on the right side of her body. She put her hands in front of her to catch herself/ break fall. She states that she hit her forehead, knee, ankle, and shin. All of these areas hurt today along with her back and medial left foot. Patient reports that her right knee is swollen. She explains that she can bear weight, but can feel unsteady. Patient expresses that her head gets irritated with movement. She mentions that she is experiencing slight difficulty with reading and concentration. Denies vision changes. She did not lose consciousness.  Has hx of osteoporosis.    Past Medical History:  Diagnosis Date   Arthritis    Colon polyps    Diverticulosis    History of chicken pox    Hyperlipidemia    Hypothyroidism    Osteoporosis    Vitamin D deficiency      Social History   Tobacco Use   Smoking status: Never   Smokeless tobacco: Never  Substance Use Topics   Alcohol use: Never   Drug use: Never    Past Surgical History:  Procedure Laterality Date   BREAST BIOPSY Left    Benign   TONSILLECTOMY AND ADENOIDECTOMY      Family History  Problem Relation Age of Onset   Arthritis Mother    Breast cancer Mother        51s-70s   High Cholesterol Mother    Heart attack Father    High Cholesterol Father    Arthritis Brother    Hearing loss Brother    Hypercholesterolemia Brother     Allergies  Allergen Reactions   Risedronate Sodium Other (See Comments)    TMJ dysfunction    Current Medications:   Current Outpatient Medications:     acetaminophen (TYLENOL 8 HOUR ARTHRITIS PAIN) 650 MG CR tablet, Take 650 mg by mouth every 8 (eight) hours as needed for pain., Disp: , Rfl:    atorvastatin (LIPITOR) 80 MG tablet, Take 1 tablet (80 mg total) by mouth daily., Disp: 90 tablet, Rfl: 3   DULoxetine (CYMBALTA) 30 MG capsule, TAKE 1 CAPSULE BY MOUTH EVERY DAY, Disp: 30 capsule, Rfl: 2   gabapentin (NEURONTIN) 100 MG capsule, Take 2 capsules (200 mg total) by mouth 3 (three) times daily., Disp: 540 capsule, Rfl: 1   ketoconazole (NIZORAL) 2 % cream, Apply to affected area 1-2 times daily, Disp: 15 g, Rfl: 0   levothyroxine (SYNTHROID) 75 MCG tablet, Take 1 tablet (75 mcg total) by mouth every other day., Disp: 90 tablet, Rfl: 1   levothyroxine (SYNTHROID) 88 MCG tablet, Take 1 tablet (88 mcg total) by mouth every other day., Disp: 90 tablet, Rfl: 1   lidocaine (XYLOCAINE) 4 % external solution, Apply topically., Disp: , Rfl:    Lidocaine 4 % PTCH, Place onto the skin., Disp: , Rfl:    meloxicam (MOBIC) 15 MG tablet, TAKE 1 TABLET DAILY, Disp: 90 tablet, Rfl: 1   Multiple Vitamins-Minerals (CENTRUM SILVER 50+WOMEN  PO), Take 1 tablet by mouth daily in the afternoon., Disp: , Rfl:    Omega-3 Fatty Acids (OMEGA 3 500 PO), Take 1 capsule by mouth daily in the afternoon., Disp: , Rfl:    OVER THE COUNTER MEDICATION, Apply topically as needed. Hempvana, Disp: , Rfl:    SYSTANE ULTRA 0.4-0.3 % SOLN, SMARTSIG:1 Drop(s) In Eye(s) PRN, Disp: , Rfl:    b complex vitamins capsule, Take 1 capsule by mouth every other day. (Patient not taking: Reported on 03/11/2022), Disp: , Rfl:    Calcium Carbonate-Vitamin D (CALCIUM 500+D PO), Take 2 each by mouth every other day. (Patient not taking: Reported on 03/11/2022), Disp: , Rfl:    Review of Systems:   ROS Negative unless otherwise specified per HPI.  Vitals:   Vitals:   03/11/22 1132  BP: 122/70  Pulse: 75  Temp: (!) 97.5 F (36.4 C)  TempSrc: Temporal  SpO2: 94%  Weight: 130 lb 8 oz (59.2  kg)  Height: 5' (1.524 m)     Body mass index is 25.49 kg/m.  Physical Exam:   Physical Exam Constitutional:      General: She is not in acute distress.    Appearance: Normal appearance. She is not ill-appearing.  HENT:     Head: Normocephalic and atraumatic.     Comments: Ecchymosis to forehead and nasal bridge Tenderness to forehead No tenderness to nasal bridge    Right Ear: External ear normal. No hemotympanum. Tympanic membrane is not injected, scarred, perforated or erythematous.     Left Ear: External ear normal. No hemotympanum. Tympanic membrane is not injected, scarred, perforated or erythematous.  Eyes:     Extraocular Movements: Extraocular movements intact.     Pupils: Pupils are equal, round, and reactive to light.  Cardiovascular:     Rate and Rhythm: Normal rate and regular rhythm.     Heart sounds: Normal heart sounds. No murmur heard.    No gallop.  Pulmonary:     Effort: Pulmonary effort is normal. No respiratory distress.     Breath sounds: Normal breath sounds. No wheezing or rales.  Musculoskeletal:     Comments: R foot: tenderness to base of great MTP joint; normal ROM; no obvious swelling/erythema  R ankle: generalized tenderness with deep palpation to R lateral ankle; normal ROM; no obvious swelling/erythema  R knee: anterior and posterior aspect with generalized TTP and mild swelling; ecchymosis to patellar region  R hand: TTP with ecchymosis of base of R thumb MCP joint;; normal ROM  Skin:    General: Skin is warm and dry.  Neurological:     General: No focal deficit present.     Mental Status: She is alert and oriented to person, place, and time.     Cranial Nerves: Cranial nerves 2-12 are intact.     Sensory: Sensation is intact.     Motor: Motor function is intact.     Coordination: Coordination is intact.     Gait: Gait is intact.  Psychiatric:        Judgment: Judgment normal.     Assessment and Plan:   Fall, initial encounter She  has not had any other falls this year. She does not want to participate in physical therapy at this time. She does not have any interest in pursuing [facility] Continue to monitor and advocate for safety precautions for patient  Acute right ankle pain Able to bear weight however due to age, trauma and history of osteoporosis will update  x-ray Defer any abnormal results to sports medicine  Right foot pain Able to bear weight however due to age, trauma and history of osteoporosis will update x-ray Defer any abnormal results to sports medicine  Acute pain of right knee Able to bear weight however due to age, trauma and history of osteoporosis will update x-ray Defer any abnormal results to sports medicine  Right wrist pain Due to age, trauma and history of osteoporosis will update x-ray Defer any abnormal results to sports medicine  Concussion without loss of consciousness, initial encounter Neuro exam WNL Referral to sports medicine for concussion evaluation, was able to secure appointment for tomorrow with Dr. [name]. Will obtain head CT given age.  If any abnormalities, will refer to the emergency room.  Discussed that if any of her symptoms worsen in the meantime, she should go to the ER.  I,Verona Buck,acting as a Education administrator for [employer], PA.,have documented all relevant documentation on the behalf of Inda Coke, PA,as directed by  Inda Coke, PA while in the presence of Inda Coke, [place].  I, Inda Coke, [place], have reviewed all documentation for this visit. The documentation on 03/11/22 for the exam, diagnosis, procedures, and orders are all accurate and complete.  Time spent with patient today was 53 minutes which consisted of chart review, discussing diagnosis, work up, treatment answering questions and documentation.   Inda Coke, PA-C

## 2022-03-11 NOTE — Patient Instructions (Signed)
It was great to see you!  You have a concussion -- please read attached handout and see Dr Debroah Loop  An order for xrays has been put in for you. To have this done, you can walk in at the [facility] location without a scheduled appointment.  The address is [name] It is across the street from [facility]. Lab and x-xray are located in the basement.   Hours of operation are M-F 8:30am to 5:00pm.  Please note that they are closed for lunch between 12:30 and 1:00pm.  We are also ordering a head CT -- please stay by your phone to be contacted about this  Go to the ER if any worsening symptoms!  Take care,  Inda Coke PA-C

## 2022-03-12 ENCOUNTER — Ambulatory Visit (INDEPENDENT_AMBULATORY_CARE_PROVIDER_SITE_OTHER): Payer: Medicare Other | Admitting: Family Medicine

## 2022-03-12 VITALS — BP 130/84 | HR 73 | Ht 60.0 in | Wt 130.0 lb

## 2022-03-12 DIAGNOSIS — M25561 Pain in right knee: Secondary | ICD-10-CM | POA: Diagnosis not present

## 2022-03-12 DIAGNOSIS — M25571 Pain in right ankle and joints of right foot: Secondary | ICD-10-CM

## 2022-03-12 DIAGNOSIS — S060X0A Concussion without loss of consciousness, initial encounter: Secondary | ICD-10-CM | POA: Diagnosis not present

## 2022-03-12 NOTE — Patient Instructions (Addendum)
Thank you for coming in today.   Recheck in 3 weeks  Use tylenol and voltaren gel on the sore ankle and knee.   We can add PT or even conative rehab but I think you would find that to be more annoying than helpful right now.

## 2022-03-25 ENCOUNTER — Telehealth: Payer: Self-pay | Admitting: Family Medicine

## 2022-03-25 NOTE — Telephone Encounter (Signed)
Pt requesting refill of Gabapentin to [facility].

## 2022-03-26 MED ORDER — GABAPENTIN 100 MG PO CAPS: 200.0000 mg | ORAL_CAPSULE | Freq: Three times a day (TID) | ORAL | 1 refills | Status: DC

## 2022-03-26 NOTE — Telephone Encounter (Signed)
Done

## 2022-04-02 ENCOUNTER — Encounter: Payer: Medicare Other | Admitting: Family Medicine

## 2022-04-02 NOTE — Progress Notes (Unsigned)
Subjective:   I, Heather Hodges, LAT, ATC acting as a scribe for Heather Leader, MD.  Chief Complaint: Heather Hodges,  is a 77 y.o. female who presents for f/u concussion. Pt suffered a fall on 12/7, when walking to the [employer] store and mis-judging the height of the step, falling forward. No LOC. Pt was last seen by Dr. Georgina Hodges on 03/12/22 and wanted to avoid PT/vestibular therapy for now and was advised to use Tylenol and Voltaren gel. Today, pt reports  Dx imaging: 03/11/22 Head CT and R wrist, R knee, R foot, & R ankle XR   Injury date : 03/06/22 Visit #: 2  History of Present Illness:   Concussion Self-Reported Symptom Score Symptoms rated on a scale 1-6, in last 24 hours  Headache: ***   Pressure in head: *** Neck pain: *** Nausea or vomiting: *** Dizziness: ***  Blurred vision: ***  Balance problems: *** Sensitivity to light:  *** Sensitivity to noise: *** Feeling slowed down: *** Feeling like "in a fog": *** "Don't feel right": *** Difficulty concentrating: *** Difficulty remembering: *** Fatigue or low energy: *** Confusion: *** Drowsiness: *** More emotional: *** Irritability: *** Sadness: *** Nervous or anxious: *** Trouble falling asleep: ***   Total # of Symptoms: *** Total Symptom Score: ***  Previous Total # of Symptoms: 18/22 Previous Symptom Score: 66/132  Tinnitus: No  Review of Systems:  ***    Review of History: ***  Objective:    Physical Examination There were no vitals filed for this visit. MSK:  *** Neuro: *** Psych: ***     Imaging:  ***  Assessment and Plan   77 y.o. female with ***    ***    Action/Discussion: Reviewed diagnosis, management options, expected outcomes, and the reasons for scheduled and emergent follow-up. Questions were adequately answered. Patient expressed verbal understanding and agreement with the following plan.     Patient Education: Reviewed with patient the risks (i.e, a repeat  concussion, post-concussion syndrome, second-impact syndrome) of returning to play prior to complete resolution, and thoroughly reviewed the signs and symptoms of concussion.Reviewed need for complete resolution of all symptoms, with rest AND exertion, prior to return to play. Reviewed red flags for urgent medical evaluation: worsening symptoms, nausea/vomiting, intractable headache, musculoskeletal changes, focal neurological deficits. [facility]'s Concussion Care Plan, which clearly outlines the plans stated above, was given to patient.   Level of service: ***     After Visit Summary printed out and provided to patient as appropriate.  The above documentation has been reviewed and is accurate and complete Heather Hodges

## 2022-04-03 ENCOUNTER — Ambulatory Visit (INDEPENDENT_AMBULATORY_CARE_PROVIDER_SITE_OTHER): Payer: Medicare Other

## 2022-04-03 ENCOUNTER — Ambulatory Visit (INDEPENDENT_AMBULATORY_CARE_PROVIDER_SITE_OTHER): Payer: Medicare Other | Admitting: Family Medicine

## 2022-04-03 VITALS — BP 108/72 | HR 75 | Ht 60.0 in | Wt 132.0 lb

## 2022-04-03 DIAGNOSIS — G8929 Other chronic pain: Secondary | ICD-10-CM | POA: Diagnosis not present

## 2022-04-03 DIAGNOSIS — S060X0D Concussion without loss of consciousness, subsequent encounter: Secondary | ICD-10-CM | POA: Diagnosis not present

## 2022-04-03 DIAGNOSIS — M546 Pain in thoracic spine: Secondary | ICD-10-CM

## 2022-04-03 DIAGNOSIS — M545 Low back pain, unspecified: Secondary | ICD-10-CM

## 2022-04-03 MED ORDER — METHOCARBAMOL 500 MG PO TABS: 500.0000 mg | ORAL_TABLET | Freq: Every evening | ORAL | 1 refills | Status: DC | PRN

## 2022-04-03 NOTE — Patient Instructions (Signed)
Thank you for coming in today.   Please get an Xray today before you leave   Try the methocarbinol at bedtime.   Plan for aquatic PT.   Recheck in 3 weeks.

## 2022-04-04 NOTE — Progress Notes (Signed)
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  No new fractures are visible.

## 2022-04-04 NOTE — Progress Notes (Signed)
Lumbar spine x-ray shows some arthritis changes.

## 2022-04-07 ENCOUNTER — Other Ambulatory Visit: Payer: Self-pay | Admitting: Physician Assistant

## 2022-04-07 ENCOUNTER — Telehealth: Payer: Self-pay | Admitting: Physician Assistant

## 2022-04-07 DIAGNOSIS — N644 Mastodynia: Secondary | ICD-10-CM

## 2022-04-07 DIAGNOSIS — Z1231 Encounter for screening mammogram for malignant neoplasm of breast: Secondary | ICD-10-CM

## 2022-04-07 NOTE — Telephone Encounter (Signed)
Patient states she called the [facility] to schedule her yearly Mammogram and told the [facility] Center that her left Breast feels full (after recovering from fall).  States the [facility] told her that she needs the Mammogram Order to be changed to Diagnostic.

## 2022-04-07 NOTE — Telephone Encounter (Signed)
Please see message and advise if okay to order Diagnostic Bilateral Mammogram?

## 2022-04-08 NOTE — Telephone Encounter (Signed)
Spoke to pt told her Heather Hodges signed orders you should be able to call [facility] Center and schedule Mammogram. Pt verbalized understanding and said they told her they will call her. Told her okay, but if you do not hear from them by end of week call them. Pt verbalized understanding.

## 2022-04-10 NOTE — Telephone Encounter (Signed)
[place] with [facility] states they have not received the Order for the  Diagnostic Mammogram (states they were having trouble with the fax machine for a couple of days but it is working now)  Requests  Order be resent

## 2022-04-11 NOTE — Telephone Encounter (Signed)
Called GI spoke to [place] told her [facility] called yesterday for order. I tried to fax order for Diagnostic mammogram but would not go through asked her if she could see it in the system? Silverio Lay said yes, but still need to fax order over. Told her okay will try again later.

## 2022-04-11 NOTE — Addendum Note (Signed)
Addended by: Marian Sorrow on: 04/11/2022 09:31 AM   Modules accepted: Orders

## 2022-04-11 NOTE — Telephone Encounter (Signed)
Called GI again spoke to [facility] told her I have been trying all day to send fax it is not working, is there another number? She said [phone]. Order faxed to [phone] and finally went through.

## 2022-04-15 ENCOUNTER — Encounter (HOSPITAL_BASED_OUTPATIENT_CLINIC_OR_DEPARTMENT_OTHER): Payer: Self-pay | Admitting: Physical Therapy

## 2022-04-15 ENCOUNTER — Ambulatory Visit (HOSPITAL_BASED_OUTPATIENT_CLINIC_OR_DEPARTMENT_OTHER): Payer: Medicare Other | Attending: Family Medicine | Admitting: Physical Therapy

## 2022-04-15 DIAGNOSIS — R2681 Unsteadiness on feet: Secondary | ICD-10-CM | POA: Diagnosis not present

## 2022-04-15 DIAGNOSIS — M546 Pain in thoracic spine: Secondary | ICD-10-CM | POA: Diagnosis not present

## 2022-04-15 DIAGNOSIS — M6281 Muscle weakness (generalized): Secondary | ICD-10-CM | POA: Diagnosis not present

## 2022-04-15 DIAGNOSIS — M5459 Other low back pain: Secondary | ICD-10-CM | POA: Insufficient documentation

## 2022-04-15 DIAGNOSIS — R296 Repeated falls: Secondary | ICD-10-CM | POA: Insufficient documentation

## 2022-04-15 DIAGNOSIS — G8929 Other chronic pain: Secondary | ICD-10-CM | POA: Insufficient documentation

## 2022-04-15 DIAGNOSIS — M545 Low back pain, unspecified: Secondary | ICD-10-CM | POA: Insufficient documentation

## 2022-04-15 DIAGNOSIS — R252 Cramp and spasm: Secondary | ICD-10-CM

## 2022-04-15 NOTE — Therapy (Signed)
OUTPATIENT PHYSICAL THERAPY THORACOLUMBAR EVALUATION   Patient Name: Heather Hodges MRN: [id number] DOB:01-28-1946, 77 y.o., female Today's Date: 04/15/2022  END OF SESSION:  PT End of Session - 04/15/22 1609     Visit Number 1    Number of Visits 17    Date for PT Re-Evaluation 06/10/22    Authorization Type MCR and [employer]    Authorization Time Period 04/15/22 to 06/24/22    Progress Note Due on Visit 10    PT Start Time 1515    PT Stop Time 1554    PT Time Calculation (min) 39 min    Activity Tolerance Patient tolerated treatment well    Behavior During Therapy [facility] for tasks assessed/performed             Past Medical History:  Diagnosis Date   Arthritis    Colon polyps    Diverticulosis    History of chicken pox    Hyperlipidemia    Hypothyroidism    Osteoporosis    Vitamin D deficiency    Past Surgical History:  Procedure Laterality Date   BREAST BIOPSY Left    Benign   TONSILLECTOMY AND ADENOIDECTOMY     Patient Active Problem List   Diagnosis Date Noted   Fibromyalgia 02/11/2021   Chronic neck pain 02/11/2021   Osteoporosis    Hypothyroidism    Hyperlipidemia    Diverticulosis    Arthritis    Chronic pain syndrome 02/03/2018   Contusion of left lung 07/20/2017   Cerebral contusion ([place]) 07/17/2017   Traumatic pneumothorax 07/17/2017   Rib fractures 07/16/2017   Vitamin D deficiency 08/06/2015    PCP: Inda Coke PA-C  REFERRING PROVIDER: Gregor Hams, MD  REFERRING DIAG: M54.50,G89.29 (ICD-10-CM) - Chronic bilateral low back pain without sciatica M54.6,G89.29 (ICD-10-CM) - Chronic bilateral thoracic back pain  Rationale for Evaluation and Treatment: Rehabilitation  THERAPY DIAG:  Other low back pain  Pain in thoracic spine  Cramp and spasm  Muscle weakness (generalized)  Unsteadiness on feet  Repeated falls  ONSET DATE: 04/03/2022  SUBJECTIVE:                                                                                                                                                                                            SUBJECTIVE STATEMENT:  The back's bothering me, I had a fall in early December and I'm still sore. I've always had some light sensitivity, since the most recent fall I'll get one sided HA that doesn't stay. I hit my knee and ankle when I fell they are both still sore, can't bend down on my knee  and stay very long. Something in my elbow has come up, I can't twist my arm and it hurts, I have to straighten it out. Its just getting older. I fell because I tripped on the asphalt curb, per spouse she has been shuffling a lot more recently. I'm just getting older and having a lot of pain in general from OA flares.   PERTINENT HISTORY:  77 y.o. female with concussion complicated by acute exacerbation of chronic back pain.  She had a fall almost a month ago now and suffered a concussion as well as worsening of her back pain.   Her diffuse soreness and especially her thoracic and lumbar back pain are her chief problems now.  She is reluctant to consider [place] physical therapy as she feels that hurt her previously.  Her primary care provider suggested aquatic physical therapy and I think that is a great idea.  She is willing to consider it as well.  Plan to refer to aquatic physical therapy.   She is having trouble sleeping especially due to the body soreness and back soreness.  Will try low-dose methocarbamol at bedtime.  If that does not work we can try other muscle relaxers or other medications.   Recheck in 3 weeks.    PAIN:  Are you having pain? Yes: NPRS scale: 8-9/10 Pain location: in the upper back; "other pains don't bother me until I do something"  Pain description: "hard to say, reminds me of a flapping feeling"  Aggravating factors: movement in general, little of everything  Relieving factors: tylenol OA but "depends on what I've been doing"   PRECAUTIONS: Other: fall, hx of  compression fractures   WEIGHT BEARING RESTRICTIONS: No  FALLS:  Has patient fallen in last 6 months? Yes. Number of falls 1; (-) FOF  LIVING ENVIRONMENT: Lives with: lives with their family Lives in: Other townhome  Stairs: no steps inside or outside  Has following equipment at home: Single point cane  OCCUPATION: retired   PLOF: Independent, Independent with basic ADLs, Independent with gait, and Independent with transfers  PATIENT GOALS: be able to travel again safely, get loosened up and feeling better   NEXT MD VISIT: Dr. Georgina Snell next week   OBJECTIVE:   DIAGNOSTIC FINDINGS:  FINDINGS: There is no evidence of thoracic spine fracture. Chronic compression deformity of thoracic vertebral bodies (upper and mid) are unchanged compared to prior exam. Mild anterior spurring is identified in the thoracic spine. Alignment is normal. No other significant bone abnormalities are identified.   IMPRESSION: No acute abnormality. Chronic compression deformity of thoracic vertebral bodies are unchanged compared to prior exam.  FINDINGS: There is no evidence of lumbar spine fracture. Grade 1 anterolisthesis of L4 on L5 is unchanged. Facet joint sclerosis is identified in the mid to lower lumbar spine.   IMPRESSION: No acute fracture or dislocation. Degenerative joint changes of lumbar spine which appears stable compared prior exam.  PATIENT SURVEYS:  FOTO will do 2nd session   SCREENING FOR RED FLAGS: Bowel or bladder incontinence: No Spinal tumors: No Cauda equina syndrome: No Compression fracture: No Abdominal aneurysm: No  COGNITION: Overall cognitive status: Within functional limits for tasks assessed     SENSATION: Not tested, but reports no hx of this   MUSCLE LENGTH:   POSTURE: rounded shoulders, forward head, decreased lumbar lordosis, increased thoracic kyphosis, and flexed trunk   PALPATION: Severe trigger points and mm spasms noted L rotator cuff, L  latissimus, thoracic paraspinals- extremely sensitive  LUMBAR ROM:   AROM eval  Flexion Mild limitation   Extension Severe limitation   Right lateral flexion Moderate limitation   Left lateral flexion Moderate limitation  Right rotation   Left rotation    (Blank rows = not tested)  Thoracic ROM: flexion WNL, extension severe limitation, lateral flexion mild limitation B, R rotation severe limitation, L moderate limitation  LOWER EXTREMITY ROM:     Active  Right eval Left eval  Hip flexion 3 3  Hip extension 2 at best (from bridge, unable to lift hips even with assist of HS) 2 at best (from bridge, unable to lift hips even with assist of HS)  Hip abduction 3 3+  Hip adduction    Hip internal rotation    Hip external rotation    Knee flexion 3 3  Knee extension 4- 4-  Ankle dorsiflexion 4 4  Ankle plantarflexion    Ankle inversion    Ankle eversion     (Blank rows = not tested)  LOWER EXTREMITY MMT:    MMT Right eval Left eval  Hip flexion    Hip extension    Hip abduction    Hip adduction    Hip internal rotation    Hip external rotation    Knee flexion    Knee extension    Ankle dorsiflexion    Ankle plantarflexion    Ankle inversion    Ankle eversion     (Blank rows = not tested)  LUMBAR SPECIAL TESTS:    FUNCTIONAL TESTS:  3 minute walk test: 526f no rest breaks no device  Dynamic Gait Index: 13/24  GAIT: Distance walked: 5220fAssistive device utilized: None Level of assistance: Complete Independence Comments: limited trunk rotation, trendelenburg, flexed at hips, limited ankle DF B   TODAY'S TREATMENT:                                                                                                                              DATE:   Eval- appropriate objectives + education only     PATIENT EDUCATION:  Education details: exam findings, POC, benefits of water therapy and role of water PT in addressing her concerns with return to PT  Person  educated: Patient and Spouse Education method: Explanation Education comprehension: verbalized understanding  HOME EXERCISE PROGRAM: Will give 2nd session  ASSESSMENT:  CLINICAL IMPRESSION: Patient is a 89.o. F who was seen today for physical therapy evaluation and treatment for back pain after a fall in early December. Of note she also tells me she is having R knee and ankle pain after her fall, does not endorse significant post-concussive symptoms at this point. Exam reveals significant limitations in lumbar and thoracic mobility along with severe L sided mm spasm, impaired gait, severe functional weakness, impaired functional activity tolerance, and impaired functional balance skills. Given objective findings, I do feel that her risk of repeated falls is quite high. She seems a bit anxious  with return to PT, relates that she felt a PT in the past contributed to an injury. Will benefit from skilled PT services to address functional limitations, reduce fall risk, and optimize overall level of function moving forward.   OBJECTIVE IMPAIRMENTS: Abnormal gait, decreased activity tolerance, decreased balance, decreased mobility, difficulty walking, decreased ROM, decreased strength, decreased safety awareness, hypomobility, increased fascial restrictions, impaired flexibility, improper body mechanics, postural dysfunction, and pain.   ACTIVITY LIMITATIONS: standing, squatting, stairs, transfers, and locomotion level  PARTICIPATION LIMITATIONS: meal prep, cleaning, laundry, shopping, community activity, yard work, and church  PERSONAL FACTORS: Age, Behavior pattern, Fitness, Past/current experiences, and Time since onset of injury/illness/exacerbation are also affecting patient's functional outcome.   REHAB POTENTIAL: Good  CLINICAL DECISION MAKING: Evolving/moderate complexity  EVALUATION COMPLEXITY: Moderate   GOALS: Goals reviewed with patient? Yes  SHORT TERM GOALS: Target date:  05/13/2022    Will be compliant with appropriate progressive HEP  Baseline: Goal status: INITIAL  2.  Lumbar and thoracic ROM to be no more than 25% limited on all planes  Baseline:  Goal status: INITIAL  3.  Muscle spasms to have improved by at least 50% to assist in pain control  Baseline:  Goal status: INITIAL  4.  Pain to be no more than 5/10 at worst  Baseline:  Goal status: INITIAL    LONG TERM GOALS: Target date: 06/10/2022    Will score at least 4/5 on MMT in order to show improved functional strength  Baseline:  Goal status: INITIAL  2.  Gait mechanics to have improved with resolution of deficits in ankle DF during swing phase to reduce fall risk  Baseline:  Goal status: INITIAL  3.  Will score at least 18 on DGI to show reduced fall risk and improve ease of community access for safe travel  Baseline:  Goal status: INITIAL  4.  Will be able to ambulate at least 655f on 3MWT to show improved mobility and ability to access community for safe travel  Baseline:  Goal status: INITIAL  5.  Will be compliant with appropriate gym based exercise program to maintain functional gains and prevent falls/recurrence of severe pain  Baseline:  Goal status: INITIAL   PLAN:  PT FREQUENCY: 2x/week  PT DURATION: 8 weeks- first 4 in water, second 4 on land   PLANNED INTERVENTIONS: Therapeutic exercises, Therapeutic activity, Neuromuscular re-education, Balance training, Gait training, Patient/Family education, Self Care, Joint mobilization, Aquatic Therapy, Dry Needling, Electrical stimulation, Spinal mobilization, Cryotherapy, Moist heat, Taping, Ultrasound, Ionotophoresis '4mg'$ /ml Dexamethasone, Manual therapy, and Re-evaluation.  PLAN FOR NEXT SESSION: needs FOTO and HEP; slow but steady progression of strength, balance, lumbar/thoracic ROM, pain control  KDeniece ReePT DPT PN2

## 2022-04-16 ENCOUNTER — Other Ambulatory Visit: Payer: Self-pay | Admitting: Physician Assistant

## 2022-04-16 DIAGNOSIS — N644 Mastodynia: Secondary | ICD-10-CM

## 2022-04-23 NOTE — Progress Notes (Unsigned)
Subjective:   I, [name], LAT, ATC acting as a scribe for [name], MD.  Chief Complaint: Heather Hodges,  is a 77 y.o. female who presents for f/u concussion. Pt suffered a fall on 12/7, when walking to the [employer] and mis-judging the height of the step, falling forward. No LOC. Pt was last seen by Dr. Georgina Snell on 04/03/22 and she was prescribed methocarbamol and referred to aquatic PT, completing 1 visit. Today, pt reports  Dx imaging: 04/03/22 L-spine & T-spine XR 03/11/22 Head CT and R wrist, R knee, R foot, & R ankle XR    Injury date : 03/06/22 Visit #: 3  History of Present Illness:   Concussion Self-Reported Symptom Score Symptoms rated on a scale 1-6, in last 24 hours  Headache: ***   Pressure in head: *** Neck pain: *** Nausea or vomiting: *** Dizziness: ***  Blurred vision: ***  Balance problems: *** Sensitivity to light:  *** Sensitivity to noise: *** Feeling slowed down: *** Feeling like "in a fog": *** "Don't feel right": *** Difficulty concentrating: *** Difficulty remembering: *** Fatigue or low energy: *** Confusion: *** Drowsiness: *** More emotional: *** Irritability: *** Sadness: *** Nervous or anxious: *** Trouble falling asleep: ***   Total # of Symptoms: *** Total Symptom Score: ***  Previous Total # of Symptoms: 17/22 Previous Symptom Score: 67/132  Tinnitus: Yes/No  Review of Systems:  ***    Review of History: ***  Objective:    Physical Examination There were no vitals filed for this visit. MSK:  *** Neuro: *** Psych: ***     Imaging:  ***  Assessment and Plan   77 y.o. female with ***    ***    Action/Discussion: Reviewed diagnosis, management options, expected outcomes, and the reasons for scheduled and emergent follow-up. Questions were adequately answered. Patient expressed verbal understanding and agreement with the following plan.     Patient Education: Reviewed with patient the risks  (i.e, a repeat concussion, post-concussion syndrome, second-impact syndrome) of returning to play prior to complete resolution, and thoroughly reviewed the signs and symptoms of concussion.Reviewed need for complete resolution of all symptoms, with rest AND exertion, prior to return to play. Reviewed red flags for urgent medical evaluation: worsening symptoms, nausea/vomiting, intractable headache, musculoskeletal changes, focal neurological deficits. [facility] Concussion Care Plan, which clearly outlines the plans stated above, was given to patient.   Level of service: ***     After Visit Summary printed out and provided to patient as appropriate.  The above documentation has been reviewed and is accurate and complete [name]

## 2022-04-24 ENCOUNTER — Ambulatory Visit (INDEPENDENT_AMBULATORY_CARE_PROVIDER_SITE_OTHER): Payer: Medicare Other | Admitting: Family Medicine

## 2022-04-24 ENCOUNTER — Ambulatory Visit
Admission: RE | Admit: 2022-04-24 | Discharge: 2022-04-24 | Disposition: A | Discharge status: Home / Self Care | Payer: Medicare Other | Source: Ambulatory Visit | Attending: Physician Assistant | Admitting: Physician Assistant

## 2022-04-24 ENCOUNTER — Ambulatory Visit (INDEPENDENT_AMBULATORY_CARE_PROVIDER_SITE_OTHER): Payer: Medicare Other

## 2022-04-24 ENCOUNTER — Ambulatory Visit: Payer: Medicare Other

## 2022-04-24 VITALS — BP 120/68 | HR 71 | Ht 60.0 in | Wt 132.0 lb

## 2022-04-24 DIAGNOSIS — S060X0D Concussion without loss of consciousness, subsequent encounter: Secondary | ICD-10-CM

## 2022-04-24 DIAGNOSIS — G8929 Other chronic pain: Secondary | ICD-10-CM

## 2022-04-24 DIAGNOSIS — N644 Mastodynia: Secondary | ICD-10-CM

## 2022-04-24 DIAGNOSIS — M5481 Occipital neuralgia: Secondary | ICD-10-CM | POA: Diagnosis not present

## 2022-04-24 DIAGNOSIS — M542 Cervicalgia: Secondary | ICD-10-CM | POA: Diagnosis not present

## 2022-04-24 NOTE — Patient Instructions (Addendum)
Thank you for coming in today.   Please get an Xray today before you leave   Continue PT.   You should see Dr Davy Pique about occipital neuralgia injection.   Recheck in 4 weeks  Occipital Neuralgia  Occipital neuralgia is a type of headache that causes brief episodes of very bad pain in the back of the head. Pain from occipital neuralgia may spread (radiate) to other parts of the head. These headaches may be caused by irritation of the nerves that leave the spinal cord high up in the neck, just below the base of the skull (occipital nerves). The occipital nerves transmit sensations from the back of the head, the top of the head, and the areas behind the ears. What are the causes? This condition can occur without any known cause (primary headache syndrome). In other cases, this condition is caused by pressure on or irritation of one of the two occipital nerves. Pressure and irritation may be due to: Muscle spasm in the neck. Neck injury. Wear and tear of the vertebrae in the neck (osteoarthritis). Disease of the disks that separate the vertebrae. Swollen blood vessels that put pressure on the occipital nerves. Infections. Tumors. Diabetes. What are the signs or symptoms? This condition causes brief burning, stabbing, electric, shocking, or shooting pain in the back of the head that can radiate to the top of the head. It can happen on one side or both sides of the head. It can also cause: Pain behind the eye. Pain triggered by neck movement or hair brushing. Scalp tenderness. Aching in the back of the head between episodes of very bad pain. Pain that gets worse with exposure to bright lights. How is this diagnosed? Your health care provider may diagnose the condition based on a physical exam and your symptoms. Tests may be done, such as: Imaging studies of the brain and neck (cervical spine), such as an MRI or CT scan. These look for causes of pinched nerves. Applying pressure to the  nerves in the neck to try to re-create the pain. Injection of numbing medicine into the occipital nerve areas to see if pain goes away (diagnostic nerve block). How is this treated? Treatment for this condition may begin with simple measures, such as: Rest. Massage. Applying heat or cold to the area. Over-the-counter pain relievers. If these measures do not work, you may need other treatments, including: Medicines, such as: Prescription-strength anti-inflammatory medicines. Muscle relaxants. Anti-seizure medicines, which can relieve pain. Antidepressants, which can relieve pain. Injected medicines, such as medicines that numb the area (local anesthetic) and steroids. Pulsed radiofrequency ablation. This is when wires are implanted to deliver electrical impulses that block pain signals from the occipital nerve. Surgery to relieve nerve pressure. Physical therapy. Follow these instructions at home: Managing pain     Avoid any activities that cause pain. Rest when you have an attack of pain. Try gentle massage to relieve pain. Try a different pillow or sleeping position. If directed, apply heat to the affected area as often as told by your health care provider. Use the heat source that your health care provider recommends, such as a moist heat pack or a heating pad. Place a towel between your skin and the heat source. Leave the heat on for 20-30 minutes. Remove the heat if your skin turns bright red. This is especially important if you are unable to feel pain, heat, or cold. You have a greater risk of getting burned. If directed, put ice on the back  of your head and neck area. To do this: Put ice in a plastic bag. Place a towel between your skin and the bag. Leave the ice on for 20 minutes, 2-3 times a day. Remove the ice if your skin turns bright red. This is very important. If you cannot feel pain, heat, or cold, you have a greater risk of damage to the area. General  instructions Take over-the-counter and prescription medicines only as told by your health care provider. Avoid things that make your symptoms worse, such as bright lights. Try to stay active. Get regular exercise that does not cause pain. Ask your health care provider to suggest safe exercises for you. Work with a physical therapist to learn stretching exercises you can do at home. Practice good posture. Keep all follow-up visits. This is important. Contact a health care provider if: Your medicine is not working. You have new or worsening symptoms. Get help right away if: You have very bad head pain that does not go away. You have a sudden change in vision, balance, or speech. These symptoms may represent a serious problem that is an emergency. Do not wait to see if the symptoms will go away. Get medical help right away. Call your local emergency services (911 in the [place] Do not drive yourself to the hospital. Summary Occipital neuralgia is a type of headache that causes brief episodes of very bad pain in the back of the head. Pain from occipital neuralgia may spread (radiate) to other parts of the head. Treatment for this condition includes rest, massage, and medicines. This information is not intended to replace advice given to you by your health care provider. Make sure you discuss any questions you have with your health care provider. Document Revised: 01/15/2020 Document Reviewed: 01/15/2020 Elsevier Patient Education  [place].

## 2022-04-27 NOTE — Therapy (Signed)
OUTPATIENT PHYSICAL THERAPY THORACOLUMBAR TREATMENT   Patient Name: Heather Hodges MRN: [id number] DOB:Aug 16, 1945, 77 y.o., female Today's Date: 04/28/2022  END OF SESSION:  PT End of Session - 04/28/22 1248     Visit Number 2    Number of Visits 17    Date for PT Re-Evaluation 06/10/22    Authorization Type M[employer] and [employer]    Authorization Time Period 04/15/22 to 06/24/22    Progress Note Due on Visit 10    PT Start Time 0815    PT Stop Time 0900    PT Time Calculation (min) 45 min    Activity Tolerance Patient tolerated treatment well    Behavior During Therapy [facility] for tasks assessed/performed              Past Medical History:  Diagnosis Date   Arthritis    Colon polyps    Diverticulosis    History of chicken pox    Hyperlipidemia    Hypothyroidism    Osteoporosis    Vitamin D deficiency    Past Surgical History:  Procedure Laterality Date   BREAST BIOPSY Left    Benign   TONSILLECTOMY AND ADENOIDECTOMY     Patient Active Problem List   Diagnosis Date Noted   Fibromyalgia 02/11/2021   Chronic neck pain 02/11/2021   Osteoporosis    Hypothyroidism    Hyperlipidemia    Diverticulosis    Arthritis    Chronic pain syndrome 02/03/2018   Contusion of left lung 07/20/2017   Cerebral contusion (HCC) 07/17/2017   Traumatic pneumothorax 07/17/2017   Rib fractures 07/16/2017   Vitamin D deficiency 08/06/2015    PCP: Jarold Motto PA-C  REFERRING PROVIDER: Rodolph Bong, MD  REFERRING DIAG: M54.50,G89.29 (ICD-10-CM) - Chronic bilateral low back pain without sciatica M54.6,G89.29 (ICD-10-CM) - Chronic bilateral thoracic back pain  Rationale for Evaluation and Treatment: Rehabilitation  THERAPY DIAG:  Other low back pain  Pain in thoracic spine  Muscle weakness (generalized)  ONSET DATE: 04/03/2022  SUBJECTIVE:                                                                                                                                                                                            SUBJECTIVE STATEMENT:  "I am very nervous.  Came early like they told me to. My neck and shoulders are hurting and I have a headache"  The back's bothering me, I had a fall in early December and I'm still sore. I've always had some light sensitivity, since the most recent fall I'll get one sided HA that doesn't stay. I hit my knee and ankle  when I fell they are both still sore, can't bend down on my knee and stay very long. Something in my elbow has come up, I can't twist my arm and it hurts, I have to straighten it out. Its just getting older. I fell because I tripped on the asphalt curb, per spouse she has been shuffling a lot more recently. I'm just getting older and having a lot of pain in general from OA flares.   PERTINENT HISTORY:  77 y.o. female with concussion complicated by acute exacerbation of chronic back pain.  She had a fall almost a month ago now and suffered a concussion as well as worsening of her back pain.   Her diffuse soreness and especially her thoracic and lumbar back pain are her chief problems now.  She is reluctant to consider [place] physical therapy as she feels that hurt her previously.  Her primary care provider suggested aquatic physical therapy and I think that is a great idea.  She is willing to consider it as well.  Plan to refer to aquatic physical therapy.   She is having trouble sleeping especially due to the body soreness and back soreness.  Will try low-dose methocarbamol at bedtime.  If that does not work we can try other muscle relaxers or other medications.   Recheck in 3 weeks.    PAIN:  Are you having pain? Yes: NPRS scale: 8-9/10 Pain location: in the upper back; "other pains don't bother me until I do something"  Pain description: "hard to say, reminds me of a flapping feeling"  Aggravating factors: movement in general, little of everything  Relieving factors: tylenol OA but "depends on what I've  been doing"   PRECAUTIONS: Other: fall, hx of compression fractures   WEIGHT BEARING RESTRICTIONS: No  FALLS:  Has patient fallen in last 6 months? Yes. Number of falls 1; (-) FOF  LIVING ENVIRONMENT: Lives with: lives with their family Lives in: Other townhome  Stairs: no steps inside or outside  Has following equipment at home: Single point cane  OCCUPATION: retired   PLOF: Independent, Independent with basic ADLs, Independent with gait, and Independent with transfers  PATIENT GOALS: be able to travel again safely, get loosened up and feeling better   NEXT MD VISIT: Dr. Denyse Amass next week   OBJECTIVE:   DIAGNOSTIC FINDINGS:  FINDINGS: There is no evidence of thoracic spine fracture. Chronic compression deformity of thoracic vertebral bodies (upper and mid) are unchanged compared to prior exam. Mild anterior spurring is identified in the thoracic spine. Alignment is normal. No other significant bone abnormalities are identified.   IMPRESSION: No acute abnormality. Chronic compression deformity of thoracic vertebral bodies are unchanged compared to prior exam.  FINDINGS: There is no evidence of lumbar spine fracture. Grade 1 anterolisthesis of L4 on L5 is unchanged. Facet joint sclerosis is identified in the mid to lower lumbar spine.   IMPRESSION: No acute fracture or dislocation. Degenerative joint changes of lumbar spine which appears stable compared prior exam.  PATIENT SURVEYS:  FOTO will do 2nd session   SCREENING FOR RED FLAGS: Bowel or bladder incontinence: No Spinal tumors: No Cauda equina syndrome: No Compression fracture: No Abdominal aneurysm: No  COGNITION: Overall cognitive status: Within functional limits for tasks assessed     SENSATION: Not tested, but reports no hx of this   MUSCLE LENGTH:   POSTURE: rounded shoulders, forward head, decreased lumbar lordosis, increased thoracic kyphosis, and flexed trunk   PALPATION: Severe trigger  points  and mm spasms noted L rotator cuff, L latissimus, thoracic paraspinals- extremely sensitive   LUMBAR ROM:   AROM eval  Flexion Mild limitation   Extension Severe limitation   Right lateral flexion Moderate limitation   Left lateral flexion Moderate limitation  Right rotation   Left rotation    (Blank rows = not tested)  Thoracic ROM: flexion WNL, extension severe limitation, lateral flexion mild limitation B, R rotation severe limitation, L moderate limitation  LOWER EXTREMITY ROM:     Active  Right eval Left eval  Hip flexion 3 3  Hip extension 2 at best (from bridge, unable to lift hips even with assist of HS) 2 at best (from bridge, unable to lift hips even with assist of HS)  Hip abduction 3 3+  Hip adduction    Hip internal rotation    Hip external rotation    Knee flexion 3 3  Knee extension 4- 4-  Ankle dorsiflexion 4 4  Ankle plantarflexion    Ankle inversion    Ankle eversion     (Blank rows = not tested)  LOWER EXTREMITY MMT:    MMT Right eval Left eval  Hip flexion    Hip extension    Hip abduction    Hip adduction    Hip internal rotation    Hip external rotation    Knee flexion    Knee extension    Ankle dorsiflexion    Ankle plantarflexion    Ankle inversion    Ankle eversion     (Blank rows = not tested)  LUMBAR SPECIAL TESTS:    FUNCTIONAL TESTS:  3 minute walk test: 560ft no rest breaks no device  Dynamic Gait Index: 13/24  GAIT: Distance walked: 540ft Assistive device utilized: None Level of assistance: Complete Independence Comments: limited trunk rotation, trendelenburg, flexed at hips, limited ankle DF B   TODAY'S TREATMENT:                                                                                                                              Pt seen for aquatic therapy today.  Treatment took place in water 3.25-4.5 ft in depth at the [facility] pool. Temp of water was 91.  Pt entered/exited the pool via  stairs with hand rail.  Intro to setting UE support white barbell forward and backward amb x2-4 widths -2 widths without ue support and arm swing UE ROM using 1 foam hand buoys: shoulder horizontal abd; add/abd and flex/ext 0-90d *Ue support barbell: df; pf *UE support on wall then gutter: hip add/abd and hip extension x 10. *Walking 1 width backward using barbell (pt stops due to pain).   Pt requires the buoyancy and hydrostatic pressure of water for support, and to offload joints by unweighting joint load by at least 50 % in navel deep water and by at least 75-80% in chest to neck deep water.  Viscosity of the water is needed for  resistance of strengthening. Water current perturbations provides challenge to standing balance requiring increased core activation.     PATIENT EDUCATION:  Education details: exam findings, POC, benefits of water therapy and role of water PT in addressing her concerns with return to PT  Person educated: Patient and Spouse Education method: Explanation Education comprehension: verbalized understanding  HOME EXERCISE PROGRAM: Will give 2nd session  ASSESSMENT:  CLINICAL IMPRESSION:  Pt demonstrates safety with supervision submerged with therapist instructing from deck.  She is instructed through gentle walking and exercises to acclimate to setting; gaining COB maintaining balance which she does well/quickly.  She is limited by shoulder cervical and head pain with all activity. Varied hand placement with standing exercises (barbell, pool edge and gutter) no surface seeded to be better than other. Gentle massage left upper trap and sup occipitals increase head pain. She is a good candidate for aquatic therapy and will benefit from the properties of water to progress towards goals    Patient is a 77 y.o. F who was seen today for physical therapy evaluation and treatment for back pain after a fall in early December. Of note she also tells me she is having R  knee and ankle pain after her fall, does not endorse significant post-concussive symptoms at this point. Exam reveals significant limitations in lumbar and thoracic mobility along with severe L sided mm spasm, impaired gait, severe functional weakness, impaired functional activity tolerance, and impaired functional balance skills. Given objective findings, I do feel that her risk of repeated falls is quite high. She seems a bit anxious with return to PT, relates that she felt a PT in the past contributed to an injury. Will benefit from skilled PT services to address functional limitations, reduce fall risk, and optimize overall level of function moving forward.   OBJECTIVE IMPAIRMENTS: Abnormal gait, decreased activity tolerance, decreased balance, decreased mobility, difficulty walking, decreased ROM, decreased strength, decreased safety awareness, hypomobility, increased fascial restrictions, impaired flexibility, improper body mechanics, postural dysfunction, and pain.   ACTIVITY LIMITATIONS: standing, squatting, stairs, transfers, and locomotion level  PARTICIPATION LIMITATIONS: meal prep, cleaning, laundry, shopping, community activity, yard work, and church  PERSONAL FACTORS: Age, Behavior pattern, Fitness, Past/current experiences, and Time since onset of injury/illness/exacerbation are also affecting patient's functional outcome.   REHAB POTENTIAL: Good  CLINICAL DECISION MAKING: Evolving/moderate complexity  EVALUATION COMPLEXITY: Moderate   GOALS: Goals reviewed with patient? Yes  SHORT TERM GOALS: Target date: 05/13/2022    Will be compliant with appropriate progressive HEP  Baseline: Goal status: INITIAL  2.  Lumbar and thoracic ROM to be no more than 25% limited on all planes  Baseline:  Goal status: INITIAL  3.  Muscle spasms to have improved by at least 50% to assist in pain control  Baseline:  Goal status: INITIAL  4.  Pain to be no more than 5/10 at worst   Baseline:  Goal status: INITIAL    LONG TERM GOALS: Target date: 06/10/2022    Will score at least 4/5 on MMT in order to show improved functional strength  Baseline:  Goal status: INITIAL  2.  Gait mechanics to have improved with resolution of deficits in ankle DF during swing phase to reduce fall risk  Baseline:  Goal status: INITIAL  3.  Will score at least 18 on DGI to show reduced fall risk and improve ease of community access for safe travel  Baseline:  Goal status: INITIAL  4.  Will be able to ambulate at least  653ft on to show improved mobility and ability to access community for safe travel  Baseline:  Goal status: INITIAL  5.  Will be compliant with appropriate gym based exercise program to maintain functional gains and prevent falls/recurrence of severe pain  Baseline:  Goal status: INITIAL   PLAN:  PT FREQUENCY: 2x/week  PT DURATION: 8 weeks- first 4 in water, second 4 on land   PLANNED INTERVENTIONS: Therapeutic exercises, Therapeutic activity, Neuromuscular re-education, Balance training, Gait training, Patient/Family education, Self Care, Joint mobilization, Aquatic Therapy, Dry Needling, Electrical stimulation, Spinal mobilization, Cryotherapy, Moist heat, Taping, Ultrasound, Ionotophoresis 4mg /ml Dexamethasone, Manual therapy, and Re-evaluation.  PLAN FOR NEXT SESSION: needs FOTO and HEP; slow but steady progression of strength, balance, lumbar/thoracic ROM, pain control  Corrie Dandy (Frankie) Latashia Koch MPT 04/28/22 908

## 2022-04-28 ENCOUNTER — Encounter (HOSPITAL_BASED_OUTPATIENT_CLINIC_OR_DEPARTMENT_OTHER): Payer: Self-pay | Admitting: Physical Therapy

## 2022-04-28 ENCOUNTER — Ambulatory Visit (HOSPITAL_BASED_OUTPATIENT_CLINIC_OR_DEPARTMENT_OTHER): Payer: Medicare Other | Admitting: Physical Therapy

## 2022-04-28 DIAGNOSIS — M5459 Other low back pain: Secondary | ICD-10-CM

## 2022-04-28 DIAGNOSIS — M6281 Muscle weakness (generalized): Secondary | ICD-10-CM

## 2022-04-28 DIAGNOSIS — M546 Pain in thoracic spine: Secondary | ICD-10-CM

## 2022-04-28 DIAGNOSIS — M545 Low back pain, unspecified: Secondary | ICD-10-CM | POA: Diagnosis not present

## 2022-04-28 NOTE — Progress Notes (Signed)
Cervical spine x-ray shows some arthritis changes.

## 2022-04-30 ENCOUNTER — Encounter (HOSPITAL_BASED_OUTPATIENT_CLINIC_OR_DEPARTMENT_OTHER): Payer: Self-pay | Admitting: Physical Therapy

## 2022-04-30 ENCOUNTER — Ambulatory Visit (HOSPITAL_BASED_OUTPATIENT_CLINIC_OR_DEPARTMENT_OTHER): Payer: Medicare Other | Admitting: Physical Therapy

## 2022-04-30 DIAGNOSIS — M545 Low back pain, unspecified: Secondary | ICD-10-CM | POA: Diagnosis not present

## 2022-04-30 DIAGNOSIS — M5459 Other low back pain: Secondary | ICD-10-CM

## 2022-04-30 DIAGNOSIS — R2681 Unsteadiness on feet: Secondary | ICD-10-CM

## 2022-04-30 DIAGNOSIS — M546 Pain in thoracic spine: Secondary | ICD-10-CM

## 2022-04-30 DIAGNOSIS — M6281 Muscle weakness (generalized): Secondary | ICD-10-CM

## 2022-04-30 DIAGNOSIS — R252 Cramp and spasm: Secondary | ICD-10-CM

## 2022-04-30 NOTE — Therapy (Signed)
OUTPATIENT PHYSICAL THERAPY THORACOLUMBAR TREATMENT   Patient Name: Heather Hodges MRN: [id number] DOB:11/13/1945, 77 y.o., female Today's Date: 04/30/2022  END OF SESSION:  PT End of Session - 04/30/22 1257     Visit Number 3    Number of Visits 17    Date for PT Re-Evaluation 06/10/22    Authorization Type M[employer] and [employer]    Authorization Time Period 04/15/22 to 06/24/22    Progress Note Due on Visit 10    PT Start Time 0901    PT Stop Time 0945    PT Time Calculation (min) 44 min    Activity Tolerance Patient tolerated treatment well    Behavior During Therapy [facility] for tasks assessed/performed              Past Medical History:  Diagnosis Date   Arthritis    Colon polyps    Diverticulosis    History of chicken pox    Hyperlipidemia    Hypothyroidism    Osteoporosis    Vitamin D deficiency    Past Surgical History:  Procedure Laterality Date   BREAST BIOPSY Left    Benign   TONSILLECTOMY AND ADENOIDECTOMY     Patient Active Problem List   Diagnosis Date Noted   Fibromyalgia 02/11/2021   Chronic neck pain 02/11/2021   Osteoporosis    Hypothyroidism    Hyperlipidemia    Diverticulosis    Arthritis    Chronic pain syndrome 02/03/2018   Contusion of left lung 07/20/2017   Cerebral contusion ([place]) 07/17/2017   Traumatic pneumothorax 07/17/2017   Rib fractures 07/16/2017   Vitamin D deficiency 08/06/2015    PCP: Inda Coke PA-C  REFERRING PROVIDER: Gregor Hams, MD  REFERRING DIAG: M54.50,G89.29 (ICD-10-CM) - Chronic bilateral low back pain without sciatica M54.6,G89.29 (ICD-10-CM) - Chronic bilateral thoracic back pain  Rationale for Evaluation and Treatment: Rehabilitation  THERAPY DIAG:  Other low back pain  Pain in thoracic spine  Muscle weakness (generalized)  Cramp and spasm  Unsteadiness on feet  ONSET DATE: 04/03/2022  SUBJECTIVE:                                                                                                                                                                                            SUBJECTIVE STATEMENT:  "I was real tired after last session"  The back's bothering me, I had a fall in early December and I'm still sore. I've always had some light sensitivity, since the most recent fall I'll get one sided HA that doesn't stay. I hit my knee and ankle when I fell they are both still sore,  can't bend down on my knee and stay very long. Something in my elbow has come up, I can't twist my arm and it hurts, I have to straighten it out. Its just getting older. I fell because I tripped on the asphalt curb, per spouse she has been shuffling a lot more recently. I'm just getting older and having a lot of pain in general from OA flares.   PERTINENT HISTORY:  77 y.o. female with concussion complicated by acute exacerbation of chronic back pain.  She had a fall almost a month ago now and suffered a concussion as well as worsening of her back pain.   Her diffuse soreness and especially her thoracic and lumbar back pain are her chief problems now.  She is reluctant to consider [place] physical therapy as she feels that hurt her previously.  Her primary care provider suggested aquatic physical therapy and I think that is a great idea.  She is willing to consider it as well.  Plan to refer to aquatic physical therapy.   She is having trouble sleeping especially due to the body soreness and back soreness.  Will try low-dose methocarbamol at bedtime.  If that does not work we can try other muscle relaxers or other medications.   Recheck in 3 weeks.    PAIN:  Are you having pain? Yes: NPRS scale: 3/10 Pain location: in the upper back; "other pains don't bother me until I do something"  Pain description: "hard to say, reminds me of a flapping feeling"  Aggravating factors: movement in general, little of everything  Relieving factors: tylenol OA but "depends on what I've been doing"   PRECAUTIONS:  Other: fall, hx of compression fractures   WEIGHT BEARING RESTRICTIONS: No  FALLS:  Has patient fallen in last 6 months? Yes. Number of falls 1; (-) FOF  LIVING ENVIRONMENT: Lives with: lives with their family Lives in: Other townhome  Stairs: no steps inside or outside  Has following equipment at home: Single point cane  OCCUPATION: retired   PLOF: Independent, Independent with basic ADLs, Independent with gait, and Independent with transfers  PATIENT GOALS: be able to travel again safely, get loosened up and feeling better   NEXT MD VISIT: Dr. Georgina Snell next week   OBJECTIVE:   DIAGNOSTIC FINDINGS:  FINDINGS: There is no evidence of thoracic spine fracture. Chronic compression deformity of thoracic vertebral bodies (upper and mid) are unchanged compared to prior exam. Mild anterior spurring is identified in the thoracic spine. Alignment is normal. No other significant bone abnormalities are identified.   IMPRESSION: No acute abnormality. Chronic compression deformity of thoracic vertebral bodies are unchanged compared to prior exam.  FINDINGS: There is no evidence of lumbar spine fracture. Grade 1 anterolisthesis of L4 on L5 is unchanged. Facet joint sclerosis is identified in the mid to lower lumbar spine.   IMPRESSION: No acute fracture or dislocation. Degenerative joint changes of lumbar spine which appears stable compared prior exam.  PATIENT SURVEYS:  FOTO will do 2nd session   SCREENING FOR RED FLAGS: Bowel or bladder incontinence: No Spinal tumors: No Cauda equina syndrome: No Compression fracture: No Abdominal aneurysm: No  COGNITION: Overall cognitive status: Within functional limits for tasks assessed     SENSATION: Not tested, but reports no hx of this   MUSCLE LENGTH:   POSTURE: rounded shoulders, forward head, decreased lumbar lordosis, increased thoracic kyphosis, and flexed trunk   PALPATION: Severe trigger points and mm spasms noted L  rotator cuff, L  latissimus, thoracic paraspinals- extremely sensitive   LUMBAR ROM:   AROM eval  Flexion Mild limitation   Extension Severe limitation   Right lateral flexion Moderate limitation   Left lateral flexion Moderate limitation  Right rotation   Left rotation    (Blank rows = not tested)  Thoracic ROM: flexion WNL, extension severe limitation, lateral flexion mild limitation B, R rotation severe limitation, L moderate limitation  LOWER EXTREMITY ROM:     Active  Right eval Left eval  Hip flexion 3 3  Hip extension 2 at best (from bridge, unable to lift hips even with assist of HS) 2 at best (from bridge, unable to lift hips even with assist of HS)  Hip abduction 3 3+  Hip adduction    Hip internal rotation    Hip external rotation    Knee flexion 3 3  Knee extension 4- 4-  Ankle dorsiflexion 4 4  Ankle plantarflexion    Ankle inversion    Ankle eversion     (Blank rows = not tested)  LOWER EXTREMITY MMT:    MMT Right eval Left eval  Hip flexion    Hip extension    Hip abduction    Hip adduction    Hip internal rotation    Hip external rotation    Knee flexion    Knee extension    Ankle dorsiflexion    Ankle plantarflexion    Ankle inversion    Ankle eversion     (Blank rows = not tested)  LUMBAR SPECIAL TESTS:    FUNCTIONAL TESTS:  3 minute walk test: 549f no rest breaks no device  Dynamic Gait Index: 13/24  GAIT: Distance walked: 5250fAssistive device utilized: None Level of assistance: Complete Independence Comments: limited trunk rotation, trendelenburg, flexed at hips, limited ankle DF B   TODAY'S TREATMENT:                                                                                                                              Pt seen for aquatic therapy today.  Treatment took place in [place] 3.25-4.[place]  Pt entered/exited the pool via stairs with hand rail.   *UE  support white barbell forward and backward amb x4-6 widths   -4 widths side stepping *Seated on lift: cycling; add/abd *UE support in drain: df; pf; add/abd; hip extension and mini-sqauts *Seated on bench: STS ue support barbell ont step x 7-8. VC and demonstration for execution and gaining      immediate standing balance *Standing on water step squats x 4 (discomfort cervical spine) *Seated row  *walking forward and back between exercises for recovery   Pt requires the buoyancy and hydrostatic pressure of water for support, and to offload joints by unweighting joint load by at least 50 % in navel deep water and by at least 75-80% in chest to neck deep water.  Viscosity of the water is needed for resistance of strengthening. Water current perturbations provides challenge to standing balance requiring increased core activation.     PATIENT EDUCATION:  Education details: exam findings, POC, benefits of water therapy and role of water PT in addressing her concerns with return to PT  Person educated: Patient and Spouse Education method: Explanation Education comprehension: verbalized understanding  HOME EXERCISE PROGRAM: Will give 2nd session  ASSESSMENT:  CLINICAL IMPRESSION: Pt requires constant VC and assurances throughout session for execution and confidence.  She does have decreased pain today in cervical spine and head. STS from bench increased shoulder discomfort due to guarding. Cues for relaxed posture improves.  She tolerates session very well.  Added standing exercise,  UE support in gutter to decrease potential shoulder discomfort. Goals ongoing      Patient is a 77 y.o. F who was seen today for physical therapy evaluation and treatment for back pain after a fall in early December. Of note she also tells me she is having R knee and ankle pain after her fall, does not endorse significant post-concussive symptoms at this point. Exam reveals significant limitations in lumbar and  thoracic mobility along with severe L sided mm spasm, impaired gait, severe functional weakness, impaired functional activity tolerance, and impaired functional balance skills. Given objective findings, I do feel that her risk of repeated falls is quite high. She seems a bit anxious with return to PT, relates that she felt a PT in the past contributed to an injury. Will benefit from skilled PT services to address functional limitations, reduce fall risk, and optimize overall level of function moving forward.   OBJECTIVE IMPAIRMENTS: Abnormal gait, decreased activity tolerance, decreased balance, decreased mobility, difficulty walking, decreased ROM, decreased strength, decreased safety awareness, hypomobility, increased fascial restrictions, impaired flexibility, improper body mechanics, postural dysfunction, and pain.   ACTIVITY LIMITATIONS: standing, squatting, stairs, transfers, and locomotion level  PARTICIPATION LIMITATIONS: meal prep, cleaning, laundry, shopping, community activity, yard work, and church  PERSONAL FACTORS: Age, Behavior pattern, Fitness, Past/current experiences, and Time since onset of injury/illness/exacerbation are also affecting patient's functional outcome.   REHAB POTENTIAL: Good  CLINICAL DECISION MAKING: Evolving/moderate complexity  EVALUATION COMPLEXITY: Moderate   GOALS: Goals reviewed with patient? Yes  SHORT TERM GOALS: Target date: 05/13/2022    Will be compliant with appropriate progressive HEP  Baseline: Goal status: INITIAL  2.  Lumbar and thoracic ROM to be no more than 25% limited on all planes  Baseline:  Goal status: INITIAL  3.  Muscle spasms to have improved by at least 50% to assist in pain control  Baseline:  Goal status: INITIAL  4.  Pain to be no more than 5/10 at worst  Baseline:  Goal status: INITIAL    LONG TERM GOALS: Target date: 06/10/2022    Will score at least 4/5 on MMT in order to show improved functional strength   Baseline:  Goal status: INITIAL  2.  Gait mechanics to have improved with resolution of deficits in ankle DF during swing phase to reduce fall risk  Baseline:  Goal status: INITIAL  3.  Will score at least 18 on DGI to show reduced fall risk and improve ease of community access for safe travel  Baseline:  Goal status: INITIAL  4.  Will be able to ambulate at least 623f on 3MWT to show improved mobility and ability to access community for safe travel  Baseline:  Goal status: INITIAL  5.  Will be compliant  with appropriate gym based exercise program to maintain functional gains and prevent falls/recurrence of severe pain  Baseline:  Goal status: INITIAL   PLAN:  PT FREQUENCY: 2x/week  PT DURATION: 8 weeks- first 4 in water, second 4 on land   PLANNED INTERVENTIONS: Therapeutic exercises, Therapeutic activity, Neuromuscular re-education, Balance training, Gait training, Patient/Family education, Self Care, Joint mobilization, Aquatic Therapy, Dry Needling, Electrical stimulation, Spinal mobilization, Cryotherapy, Moist heat, Taping, Ultrasound, Ionotophoresis '4mg'$ /ml Dexamethasone, Manual therapy, and Re-evaluation.  PLAN FOR NEXT SESSION: needs FOTO and HEP; slow but steady progression of strength, balance, lumbar/thoracic ROM, pain control  Annamarie Major) Yamileth Hayse MPT 04/30/22 949am

## 2022-05-05 ENCOUNTER — Ambulatory Visit (HOSPITAL_BASED_OUTPATIENT_CLINIC_OR_DEPARTMENT_OTHER): Payer: Medicare Other | Attending: Family Medicine | Admitting: Physical Therapy

## 2022-05-05 ENCOUNTER — Encounter (HOSPITAL_BASED_OUTPATIENT_CLINIC_OR_DEPARTMENT_OTHER): Payer: Self-pay | Admitting: Physical Therapy

## 2022-05-05 DIAGNOSIS — R252 Cramp and spasm: Secondary | ICD-10-CM | POA: Insufficient documentation

## 2022-05-05 DIAGNOSIS — M6281 Muscle weakness (generalized): Secondary | ICD-10-CM | POA: Insufficient documentation

## 2022-05-05 DIAGNOSIS — R296 Repeated falls: Secondary | ICD-10-CM | POA: Insufficient documentation

## 2022-05-05 DIAGNOSIS — M546 Pain in thoracic spine: Secondary | ICD-10-CM | POA: Insufficient documentation

## 2022-05-05 DIAGNOSIS — R2681 Unsteadiness on feet: Secondary | ICD-10-CM | POA: Insufficient documentation

## 2022-05-05 DIAGNOSIS — M5459 Other low back pain: Secondary | ICD-10-CM | POA: Diagnosis present

## 2022-05-05 NOTE — Therapy (Signed)
OUTPATIENT PHYSICAL THERAPY THORACOLUMBAR TREATMENT   Patient Name: Heather Hodges MRN: [id number] DOB:03-01-1946, 77 y.o., female Today's Date: 05/05/2022  END OF SESSION:  PT End of Session - 05/05/22 0821     Visit Number 4    Number of Visits 17    Date for PT Re-Evaluation 06/10/22    Authorization Type MCR and [employer]    Authorization Time Period 04/15/22 to 06/24/22    Progress Note Due on Visit 10    PT Start Time 0816    PT Stop Time 0855    PT Time Calculation (min) 39 min    Activity Tolerance Patient tolerated treatment well    Behavior During Therapy [facility] for tasks assessed/performed              Past Medical History:  Diagnosis Date   Arthritis    Colon polyps    Diverticulosis    History of chicken pox    Hyperlipidemia    Hypothyroidism    Osteoporosis    Vitamin D deficiency    Past Surgical History:  Procedure Laterality Date   BREAST BIOPSY Left    Benign   TONSILLECTOMY AND ADENOIDECTOMY     Patient Active Problem List   Diagnosis Date Noted   Fibromyalgia 02/11/2021   Chronic neck pain 02/11/2021   Osteoporosis    Hypothyroidism    Hyperlipidemia    Diverticulosis    Arthritis    Chronic pain syndrome 02/03/2018   Contusion of left lung 07/20/2017   Cerebral contusion ([place]) 07/17/2017   Traumatic pneumothorax 07/17/2017   Rib fractures 07/16/2017   Vitamin D deficiency 08/06/2015    PCP: Inda Coke PA-C  REFERRING PROVIDER: Gregor Hams, MD  REFERRING DIAG: M54.50,G89.29 (ICD-10-CM) - Chronic bilateral low back pain without sciatica M54.6,G89.29 (ICD-10-CM) - Chronic bilateral thoracic back pain  Rationale for Evaluation and Treatment: Rehabilitation  THERAPY DIAG:  Other low back pain  Pain in thoracic spine  Muscle weakness (generalized)  ONSET DATE: 04/03/2022  SUBJECTIVE:                                                                                                                                                                                            SUBJECTIVE STATEMENT:  Pt reports that she is going to doctor today to see if she needs shots in her neck.    PERTINENT HISTORY:  77 y.o. female with concussion complicated by acute exacerbation of chronic back pain.  She had a fall almost a month ago now and suffered a concussion as well as worsening of her back pain.   Her diffuse soreness  and especially her thoracic and lumbar back pain are her chief problems now.  She is reluctant to consider [facility] as she feels that hurt her previously.  Her primary care provider suggested aquatic physical therapy and I think that is a great idea.  She is willing to consider it as well.  Plan to refer to aquatic physical therapy.   She is having trouble sleeping especially due to the body soreness and back soreness.  Will try low-dose methocarbamol at bedtime.  If that does not work we can try other muscle relaxers or other medications.   Recheck in 3 weeks.    PAIN:  Are you having pain? Yes: NPRS scale: 1/10 Pain location: scalp   Pain description: ache  Aggravating factors: movement in general, little of everything  Relieving factors: tylenol OA but "depends on what I've been doing"   PRECAUTIONS: Other: fall, hx of compression fractures   WEIGHT BEARING RESTRICTIONS: No  FALLS:  Has patient fallen in last 6 months? Yes. Number of falls 1; (-) FOF  LIVING ENVIRONMENT: Lives with: lives with their family Lives in: Other townhome  Stairs: no steps inside or outside  Has following equipment at home: Single point cane  OCCUPATION: retired   PLOF: Independent, Independent with basic ADLs, Independent with gait, and Independent with transfers  PATIENT GOALS: be able to travel again safely, get loosened up and feeling better   NEXT MD VISIT: Dr. Georgina Snell next week   OBJECTIVE:   DIAGNOSTIC FINDINGS:  FINDINGS: There is no evidence of thoracic spine fracture. Chronic  compression deformity of thoracic vertebral bodies (upper and mid) are unchanged compared to prior exam. Mild anterior spurring is identified in the thoracic spine. Alignment is normal. No other significant bone abnormalities are identified.   IMPRESSION: No acute abnormality. Chronic compression deformity of thoracic vertebral bodies are unchanged compared to prior exam.  FINDINGS: There is no evidence of lumbar spine fracture. Grade 1 anterolisthesis of L4 on L5 is unchanged. Facet joint sclerosis is identified in the mid to lower lumbar spine.   IMPRESSION: No acute fracture or dislocation. Degenerative joint changes of lumbar spine which appears stable compared prior exam.  PATIENT SURVEYS:  FOTO will do 2nd session   SCREENING FOR RED FLAGS: Bowel or bladder incontinence: No Spinal tumors: No Cauda equina syndrome: No Compression fracture: No Abdominal aneurysm: No  COGNITION: Overall cognitive status: Within functional limits for tasks assessed     SENSATION: Not tested, but reports no hx of this   MUSCLE LENGTH:   POSTURE: rounded shoulders, forward head, decreased lumbar lordosis, increased thoracic kyphosis, and flexed trunk   PALPATION: Severe trigger points and mm spasms noted L rotator cuff, L latissimus, thoracic paraspinals- extremely sensitive   LUMBAR ROM:   AROM eval  Flexion Mild limitation   Extension Severe limitation   Right lateral flexion Moderate limitation   Left lateral flexion Moderate limitation  Right rotation   Left rotation    (Blank rows = not tested)  Thoracic ROM: flexion WNL, extension severe limitation, lateral flexion mild limitation B, R rotation severe limitation, L moderate limitation  LOWER EXTREMITY ROM:     Active  Right eval Left eval  Hip flexion 3 3  Hip extension 2 at best (from bridge, unable to lift hips even with assist of HS) 2 at best (from bridge, unable to lift hips even with assist of HS)  Hip  abduction 3 3+  Hip adduction    Hip  internal rotation    Hip external rotation    Knee flexion 3 3  Knee extension 4- 4-  Ankle dorsiflexion 4 4  Ankle plantarflexion    Ankle inversion    Ankle eversion     (Blank rows = not tested)  LOWER EXTREMITY MMT:    MMT Right eval Left eval  Hip flexion    Hip extension    Hip abduction    Hip adduction    Hip internal rotation    Hip external rotation    Knee flexion    Knee extension    Ankle dorsiflexion    Ankle plantarflexion    Ankle inversion    Ankle eversion     (Blank rows = not tested)  LUMBAR SPECIAL TESTS:    FUNCTIONAL TESTS:  3 minute walk test: 526f no rest breaks no device  Dynamic Gait Index: 13/24  GAIT: Distance walked: 5232fAssistive device utilized: None Level of assistance: Complete Independence Comments: limited trunk rotation, trendelenburg, flexed at hips, limited ankle DF B   TODAY'S TREATMENT:                                                                                                                              Pt seen for aquatic therapy today.  Treatment took place in water 3.25-4 ft in [facility] Temp of water was 90.  Pt entered/exited the pool via stairs with hand rail.  *UE support white barbell walking forward/backward, cues for relaxed neck and arms *UE on white barbell: side stepping / side marching * holding rainbow hand floats: unilateral and bilat horiz shoulder abdct/add x 5; row x 3; bilat shoulder addct x 5 * short blue noodle press down x 5, cues for TrA and lower trap set;  short blue noodle push down with elbows straight/bent x 5  * wall push ups, cues for chin tuck for neutral x 10 * light shoulder rolls, lateral neck flexion and gentle cervical rotation  *walking forward and back between exercises for recovery with reciprocal arm swing * short 5 ft trial of doggy paddle, per pt request    Pt requires the buoyancy and hydrostatic  pressure of water for support, and to offload joints by unweighting joint load by at least 50 % in navel deep water and by at least 75-80% in chest to neck deep water.  Viscosity of the water is needed for resistance of strengthening. Water current perturbations provides challenge to standing balance requiring increased core activation.     PATIENT EDUCATION:  Education details: aquatic progressions and modifications  Person educated: Patient and Spouse Education method: Explanation Education comprehension: verbalized understanding  HOME EXERCISE PROGRAM: Will give 2nd session  ASSESSMENT:  CLINICAL IMPRESSION: Pt reported more comfort when walking in 4 ft depth vs 3 ft 6in.  She is able to walk without UE resting on floatation device but reports better balance when holding one.  Trialed some UE exercises for stretching/strengthening upper back muscles.  She reported no increase in pain, but some dizziness with moving head.  Goals are ongoing.  She remains motivated to progress.   Will benefit from skilled PT services to address functional limitations, reduce fall risk, and optimize overall level of function moving forward.   OBJECTIVE IMPAIRMENTS: Abnormal gait, decreased activity tolerance, decreased balance, decreased mobility, difficulty walking, decreased ROM, decreased strength, decreased safety awareness, hypomobility, increased fascial restrictions, impaired flexibility, improper body mechanics, postural dysfunction, and pain.   ACTIVITY LIMITATIONS: standing, squatting, stairs, transfers, and locomotion level  PARTICIPATION LIMITATIONS: meal prep, cleaning, laundry, shopping, community activity, yard work, and church  PERSONAL FACTORS: Age, Behavior pattern, Fitness, Past/current experiences, and Time since onset of injury/illness/exacerbation are also affecting patient's functional outcome.   REHAB POTENTIAL: Good  CLINICAL DECISION MAKING: Evolving/moderate  complexity  EVALUATION COMPLEXITY: Moderate   GOALS: Goals reviewed with patient? Yes  SHORT TERM GOALS: Target date: 05/13/2022    Will be compliant with appropriate progressive HEP  Baseline: Goal status: INITIAL  2.  Lumbar and thoracic ROM to be no more than 25% limited on all planes  Baseline:  Goal status: INITIAL  3.  Muscle spasms to have improved by at least 50% to assist in pain control  Baseline:  Goal status: INITIAL  4.  Pain to be no more than 5/10 at worst  Baseline:  Goal status: INITIAL    LONG TERM GOALS: Target date: 06/10/2022    Will score at least 4/5 on MMT in order to show improved functional strength  Baseline:  Goal status: INITIAL  2.  Gait mechanics to have improved with resolution of deficits in ankle DF during swing phase to reduce fall risk  Baseline:  Goal status: INITIAL  3.  Will score at least 18 on DGI to show reduced fall risk and improve ease of community access for safe travel  Baseline:  Goal status: INITIAL  4.  Will be able to ambulate at least 667f on 3MWT to show improved mobility and ability to access community for safe travel  Baseline:  Goal status: INITIAL  5.  Will be compliant with appropriate gym based exercise program to maintain functional gains and prevent falls/recurrence of severe pain  Baseline:  Goal status: INITIAL   PLAN:  PT FREQUENCY: 2x/week  PT DURATION: 8 weeks- first 4 in water, second 4 on land   PLANNED INTERVENTIONS: Therapeutic exercises, Therapeutic activity, Neuromuscular re-education, Balance training, Gait training, Patient/Family education, Self Care, Joint mobilization, Aquatic Therapy, Dry Needling, Electrical stimulation, Spinal mobilization, Cryotherapy, Moist heat, Taping, Ultrasound, Ionotophoresis '4mg'$ /ml Dexamethasone, Manual therapy, and Re-evaluation.  PLAN FOR NEXT SESSION: needs HEP; slow but steady progression of strength, balance, lumbar/thoracic ROM, pain  control  JKerin Perna PTA 05/05/22 1:16 PM [facility] [address] [address]Phone: [phone]  Fax:  [phone]

## 2022-05-06 ENCOUNTER — Telehealth: Payer: Self-pay | Admitting: Family Medicine

## 2022-05-06 NOTE — Telephone Encounter (Signed)
Patient called requesting a refill on DULoxetine (CYMBALTA) 30 MG capsule to be sent to [facility] on [address].

## 2022-05-07 ENCOUNTER — Other Ambulatory Visit: Payer: Self-pay

## 2022-05-07 ENCOUNTER — Encounter (HOSPITAL_BASED_OUTPATIENT_CLINIC_OR_DEPARTMENT_OTHER): Payer: Self-pay | Admitting: Physical Therapy

## 2022-05-07 ENCOUNTER — Ambulatory Visit (HOSPITAL_BASED_OUTPATIENT_CLINIC_OR_DEPARTMENT_OTHER): Payer: Medicare Other | Admitting: Physical Therapy

## 2022-05-07 DIAGNOSIS — M5459 Other low back pain: Secondary | ICD-10-CM

## 2022-05-07 DIAGNOSIS — M546 Pain in thoracic spine: Secondary | ICD-10-CM

## 2022-05-07 DIAGNOSIS — R252 Cramp and spasm: Secondary | ICD-10-CM

## 2022-05-07 DIAGNOSIS — M6281 Muscle weakness (generalized): Secondary | ICD-10-CM

## 2022-05-07 MED ORDER — DULOXETINE HCL 30 MG PO CPEP: 30.0000 mg | ORAL_CAPSULE | Freq: Every day | ORAL | 2 refills | Status: DC

## 2022-05-07 NOTE — Therapy (Signed)
OUTPATIENT PHYSICAL THERAPY THORACOLUMBAR TREATMENT   Patient Name: Heather Hodges MRN: [id number] DOB:1945-07-06, 77 y.o., female Today's Date: 05/07/2022  END OF SESSION:  PT End of Session - 05/07/22 0828     Visit Number 5    Number of Visits 17    Date for PT Re-Evaluation 06/10/22    Authorization Type MCR and [employer]    Progress Note Due on Visit 10    PT Start Time 0818    PT Stop Time 0857    PT Time Calculation (min) 39 min    Activity Tolerance Patient tolerated treatment well    Behavior During Therapy [facility] for tasks assessed/performed              Past Medical History:  Diagnosis Date   Arthritis    Colon polyps    Diverticulosis    History of chicken pox    Hyperlipidemia    Hypothyroidism    Osteoporosis    Vitamin D deficiency    Past Surgical History:  Procedure Laterality Date   BREAST BIOPSY Left    Benign   TONSILLECTOMY AND ADENOIDECTOMY     Patient Active Problem List   Diagnosis Date Noted   Fibromyalgia 02/11/2021   Chronic neck pain 02/11/2021   Osteoporosis    Hypothyroidism    Hyperlipidemia    Diverticulosis    Arthritis    Chronic pain syndrome 02/03/2018   Contusion of left lung 07/20/2017   Cerebral contusion ([place]) 07/17/2017   Traumatic pneumothorax 07/17/2017   Rib fractures 07/16/2017   Vitamin D deficiency 08/06/2015    PCP: Inda Coke PA-C  REFERRING PROVIDER: Gregor Hams, MD  REFERRING DIAG: M54.50,G89.29 (ICD-10-CM) - Chronic bilateral low back pain without sciatica M54.6,G89.29 (ICD-10-CM) - Chronic bilateral thoracic back pain  Rationale for Evaluation and Treatment: Rehabilitation  THERAPY DIAG:  Other low back pain  Pain in thoracic spine  Muscle weakness (generalized)  Cramp and spasm  ONSET DATE: 04/03/2022  SUBJECTIVE:                                                                                                                                                                                            SUBJECTIVE STATEMENT:  Pt reports she has a bulging disc in neck and will be getting injections in neck 05/19/22.   PERTINENT HISTORY:  77 y.o. female with concussion complicated by acute exacerbation of chronic back pain.  She had a fall almost a month ago now and suffered a concussion as well as worsening of her back pain.   Her diffuse soreness and especially her thoracic and lumbar back  pain are her chief problems now.  She is reluctant to consider [facility] therapy as she feels that hurt her previously.  Her primary care provider suggested aquatic physical therapy and I think that is a great idea.  She is willing to consider it as well.  Plan to refer to aquatic physical therapy.   She is having trouble sleeping especially due to the body soreness and back soreness.  Will try low-dose methocarbamol at bedtime.  If that does not work we can try other muscle relaxers or other medications.   Recheck in 3 weeks.    PAIN:  Are you having pain? Yes: NPRS scale: 0.5-1/10 Pain location: neck Pain description: ache  Aggravating factors: movement in general, little of everything  Relieving factors: tylenol OA but "depends on what I've been doing"   PRECAUTIONS: Other: fall, hx of compression fractures   WEIGHT BEARING RESTRICTIONS: No  FALLS:  Has patient fallen in last 6 months? Yes. Number of falls 1; (-) FOF  LIVING ENVIRONMENT: Lives with: lives with their family Lives in: Other townhome  Stairs: no steps inside or outside  Has following equipment at home: Single point cane  OCCUPATION: retired   PLOF: Independent, Independent with basic ADLs, Independent with gait, and Independent with transfers  PATIENT GOALS: be able to travel again safely, get loosened up and feeling better   NEXT MD VISIT: Dr. Georgina Snell next week   OBJECTIVE:   DIAGNOSTIC FINDINGS:  FINDINGS: There is no evidence of thoracic spine fracture. Chronic compression deformity of  thoracic vertebral bodies (upper and mid) are unchanged compared to prior exam. Mild anterior spurring is identified in the thoracic spine. Alignment is normal. No other significant bone abnormalities are identified.   IMPRESSION: No acute abnormality. Chronic compression deformity of thoracic vertebral bodies are unchanged compared to prior exam.  FINDINGS: There is no evidence of lumbar spine fracture. Grade 1 anterolisthesis of L4 on L5 is unchanged. Facet joint sclerosis is identified in the mid to lower lumbar spine.   IMPRESSION: No acute fracture or dislocation. Degenerative joint changes of lumbar spine which appears stable compared prior exam.  PATIENT SURVEYS:  FOTO will do 2nd session   SCREENING FOR RED FLAGS: Bowel or bladder incontinence: No Spinal tumors: No Cauda equina syndrome: No Compression fracture: No Abdominal aneurysm: No  COGNITION: Overall cognitive status: Within functional limits for tasks assessed     SENSATION: Not tested, but reports no hx of this   MUSCLE LENGTH:   POSTURE: rounded shoulders, forward head, decreased lumbar lordosis, increased thoracic kyphosis, and flexed trunk   PALPATION: Severe trigger points and mm spasms noted L rotator cuff, L latissimus, thoracic paraspinals- extremely sensitive   LUMBAR ROM:   AROM eval  Flexion Mild limitation   Extension Severe limitation   Right lateral flexion Moderate limitation   Left lateral flexion Moderate limitation  Right rotation   Left rotation    (Blank rows = not tested)  Thoracic ROM: flexion WNL, extension severe limitation, lateral flexion mild limitation B, R rotation severe limitation, L moderate limitation  LOWER EXTREMITY ROM:     Active  Right eval Left eval  Hip flexion 3 3  Hip extension 2 at best (from bridge, unable to lift hips even with assist of HS) 2 at best (from bridge, unable to lift hips even with assist of HS)  Hip abduction 3 3+  Hip adduction     Hip internal rotation    Hip external rotation  Knee flexion 3 3  Knee extension 4- 4-  Ankle dorsiflexion 4 4  Ankle plantarflexion    Ankle inversion    Ankle eversion     (Blank rows = not tested)  LOWER EXTREMITY MMT:    MMT Right eval Left eval  Hip flexion    Hip extension    Hip abduction    Hip adduction    Hip internal rotation    Hip external rotation    Knee flexion    Knee extension    Ankle dorsiflexion    Ankle plantarflexion    Ankle inversion    Ankle eversion     (Blank rows = not tested)  LUMBAR SPECIAL TESTS:    FUNCTIONAL TESTS:  3 minute walk test: 524f no rest breaks no device  Dynamic Gait Index: 13/24  GAIT: Distance walked: 5222fAssistive device utilized: None Level of assistance: Complete Independence Comments: limited trunk rotation, trendelenburg, flexed at hips, limited ankle DF B   TODAY'S TREATMENT:                                                                                                                              Pt seen for aquatic therapy today.  Treatment took place in water 3.25-4 ft in [facility] Temp of water was 90.  Pt entered/exited the pool via stairs with hand rail.  * without UE support: walking forward/backward, cues for relaxed neck and arms * side stepping without/ with arm addct / abdct * with fins on arms:  staggered stance - shoulder horiz abdct/ add;  standard stance - shoulder addct/abdct, bilat shoulder ER/IR, elbow flexion, elbow/shoulder ext * return to walking with reciprocal arm swing (with fins on wrists) * wall push ups, cues for chin tuck for neutral x 15 * short blue noodle push down with elbows straight/bent x 10   Pt requires the buoyancy and hydrostatic pressure of water for support, and to offload joints by unweighting joint load by at least 50 % in navel deep water and by at least 75-80% in chest to neck deep water.  Viscosity of the water is needed for  resistance of strengthening. Water current perturbations provides challenge to standing balance requiring increased core activation.     PATIENT EDUCATION:  Education details: aquatic progressions and modifications  Person educated: Patient and Spouse Education method: Explanation Education comprehension: verbalized understanding  HOME EXERCISE PROGRAM: TBA  ASSESSMENT:  CLINICAL IMPRESSION: Pt able to walk/exercise in water without any floatation device with confidence.  She prefers depth just shy of 4 ft.  Trialed fins on wrists instead of using hand floats today with similar UE strengthening exercises.  No increase in pain.   Goals are ongoing.    Will benefit from skilled PT services to address functional limitations, reduce fall risk, and optimize overall level of function moving forward.   OBJECTIVE IMPAIRMENTS: Abnormal gait, decreased activity tolerance, decreased balance, decreased  mobility, difficulty walking, decreased ROM, decreased strength, decreased safety awareness, hypomobility, increased fascial restrictions, impaired flexibility, improper body mechanics, postural dysfunction, and pain.   ACTIVITY LIMITATIONS: standing, squatting, stairs, transfers, and locomotion level  PARTICIPATION LIMITATIONS: meal prep, cleaning, laundry, shopping, community activity, yard work, and church  PERSONAL FACTORS: Age, Behavior pattern, Fitness, Past/current experiences, and Time since onset of injury/illness/exacerbation are also affecting patient's functional outcome.   REHAB POTENTIAL: Good  CLINICAL DECISION MAKING: Evolving/moderate complexity  EVALUATION COMPLEXITY: Moderate   GOALS: Goals reviewed with patient? Yes  SHORT TERM GOALS: Target date: 05/13/2022    Will be compliant with appropriate progressive HEP  Baseline: Goal status: INITIAL  2.  Lumbar and thoracic ROM to be no more than 25% limited on all planes  Baseline:  Goal status: INITIAL  3.  Muscle  spasms to have improved by at least 50% to assist in pain control  Baseline:  Goal status: INITIAL  4.  Pain to be no more than 5/10 at worst  Baseline:  Goal status: INITIAL    LONG TERM GOALS: Target date: 06/10/2022    Will score at least 4/5 on MMT in order to show improved functional strength  Baseline:  Goal status: INITIAL  2.  Gait mechanics to have improved with resolution of deficits in ankle DF during swing phase to reduce fall risk  Baseline:  Goal status: INITIAL  3.  Will score at least 18 on DGI to show reduced fall risk and improve ease of community access for safe travel  Baseline:  Goal status: INITIAL  4.  Will be able to ambulate at least 663f on 3MWT to show improved mobility and ability to access community for safe travel  Baseline:  Goal status: INITIAL  5.  Will be compliant with appropriate gym based exercise program to maintain functional gains and prevent falls/recurrence of severe pain  Baseline:  Goal status: INITIAL   PLAN:  PT FREQUENCY: 2x/week  PT DURATION: 8 weeks- first 4 in water, second 4 on land   PLANNED INTERVENTIONS: Therapeutic exercises, Therapeutic activity, Neuromuscular re-education, Balance training, Gait training, Patient/Family education, Self Care, Joint mobilization, Aquatic Therapy, Dry Needling, Electrical stimulation, Spinal mobilization, Cryotherapy, Moist heat, Taping, Ultrasound, Ionotophoresis '4mg'$ /ml Dexamethasone, Manual therapy, and Re-evaluation.  PLAN FOR NEXT SESSION: needs HEP; slow but steady progression of strength, balance, lumbar/thoracic ROM, pain control  JKerin Perna PTA 05/07/22 9:08 AM [facility] [place] [address]Phone: [phone]  Fax:  [phone]

## 2022-05-07 NOTE — Telephone Encounter (Signed)
Patient called back to follow up on this refill.  (She is able to get it from a mail order pharmacy but they will not have it here in time so she would like it filled at her local pharmacy to get her through.)

## 2022-05-12 ENCOUNTER — Ambulatory Visit (HOSPITAL_BASED_OUTPATIENT_CLINIC_OR_DEPARTMENT_OTHER): Payer: Medicare Other | Admitting: Physical Therapy

## 2022-05-12 ENCOUNTER — Encounter (HOSPITAL_BASED_OUTPATIENT_CLINIC_OR_DEPARTMENT_OTHER): Payer: Self-pay | Admitting: Physical Therapy

## 2022-05-12 DIAGNOSIS — M546 Pain in thoracic spine: Secondary | ICD-10-CM

## 2022-05-12 DIAGNOSIS — R252 Cramp and spasm: Secondary | ICD-10-CM

## 2022-05-12 DIAGNOSIS — M6281 Muscle weakness (generalized): Secondary | ICD-10-CM

## 2022-05-12 DIAGNOSIS — M5459 Other low back pain: Secondary | ICD-10-CM | POA: Diagnosis not present

## 2022-05-12 DIAGNOSIS — R2681 Unsteadiness on feet: Secondary | ICD-10-CM

## 2022-05-12 NOTE — Therapy (Signed)
OUTPATIENT PHYSICAL THERAPY THORACOLUMBAR TREATMENT   Patient Name: Heather Hodges MRN: [id number] DOB:[birth year], 77 y.o., female Today's Date: 05/12/2022  END OF SESSION:  PT End of Session - 05/12/22 0949     Visit Number 6    Number of Visits 17    Date for PT Re-Evaluation 06/10/22    Authorization Type MCR and [employer]    Authorization Time Period 04/15/22 to 06/24/22    Progress Note Due on Visit 10    PT Start Time 0945    PT Stop Time 1025    PT Time Calculation (min) 40 min    Behavior During Therapy [facility] for tasks assessed/performed              Past Medical History:  Diagnosis Date   Arthritis    Colon polyps    Diverticulosis    History of chicken pox    Hyperlipidemia    Hypothyroidism    Osteoporosis    Vitamin D deficiency    Past Surgical History:  Procedure Laterality Date   BREAST BIOPSY Left    Benign   TONSILLECTOMY AND ADENOIDECTOMY     Patient Active Problem List   Diagnosis Date Noted   Fibromyalgia 02/11/2021   Chronic neck pain 02/11/2021   Osteoporosis    Hypothyroidism    Hyperlipidemia    Diverticulosis    Arthritis    Chronic pain syndrome 02/03/2018   Contusion of left lung 07/20/2017   Cerebral contusion ([place]) 07/17/2017   Traumatic pneumothorax 07/17/2017   Rib fractures 07/16/2017   Vitamin D deficiency 08/06/2015    PCP: Inda Coke PA-C  REFERRING PROVIDER: Gregor Hams, MD  REFERRING DIAG: M54.50,G89.29 (ICD-10-CM) - Chronic bilateral low back pain without sciatica M54.6,G89.29 (ICD-10-CM) - Chronic bilateral thoracic back pain  Rationale for Evaluation and Treatment: Rehabilitation  THERAPY DIAG:  Other low back pain  Pain in thoracic spine  Muscle weakness (generalized)  Cramp and spasm  Unsteadiness on feet  ONSET DATE: 04/03/2022  SUBJECTIVE:                                                                                                                                                                                            SUBJECTIVE STATEMENT:  Pt reports her joints have been affected by the rain.  No further falls since last one. "Today is an awful day"  PERTINENT HISTORY:  77 y.o. female with concussion complicated by acute exacerbation of chronic back pain.  She had a fall almost a month ago now and suffered a concussion as well as worsening of her back pain.   Her diffuse  soreness and especially her thoracic and lumbar back pain are her chief problems now.  She is reluctant to consider [facility] as she feels that hurt her previously.  Her primary care provider suggested aquatic physical therapy and I think that is a great idea.  She is willing to consider it as well.  Plan to refer to aquatic physical therapy.   She is having trouble sleeping especially due to the body soreness and back soreness.  Will try low-dose methocarbamol at bedtime.  If that does not work we can try other muscle relaxers or other medications.   Recheck in 3 weeks.    PAIN:  Are you having pain? Yes: NPRS scale: 5/10 Pain location: generalized Pain description: ache  Aggravating factors: movement in general, little of everything  Relieving factors: tylenol OA but "depends on what I've been doing"   PRECAUTIONS: Other: fall, hx of compression fractures   WEIGHT BEARING RESTRICTIONS: No  FALLS:  Has patient fallen in last 6 months? Yes. Number of falls 1; (-) FOF  LIVING ENVIRONMENT: Lives with: lives with their family Lives in: Other townhome  Stairs: no steps inside or outside  Has following equipment at home: Single point cane  OCCUPATION: retired   PLOF: Independent, Independent with basic ADLs, Independent with gait, and Independent with transfers  PATIENT GOALS: be able to travel again safely, get loosened up and feeling better   NEXT MD VISIT: Dr. Georgina Snell next week   OBJECTIVE:   DIAGNOSTIC FINDINGS:  FINDINGS: There is no evidence of thoracic spine  fracture. Chronic compression deformity of thoracic vertebral bodies (upper and mid) are unchanged compared to prior exam. Mild anterior spurring is identified in the thoracic spine. Alignment is normal. No other significant bone abnormalities are identified.   IMPRESSION: No acute abnormality. Chronic compression deformity of thoracic vertebral bodies are unchanged compared to prior exam.  FINDINGS: There is no evidence of lumbar spine fracture. Grade 1 anterolisthesis of L4 on L5 is unchanged. Facet joint sclerosis is identified in the mid to lower lumbar spine.   IMPRESSION: No acute fracture or dislocation. Degenerative joint changes of lumbar spine which appears stable compared prior exam.  PATIENT SURVEYS:  FOTO will do 2nd session   SCREENING FOR RED FLAGS: Bowel or bladder incontinence: No Spinal tumors: No Cauda equina syndrome: No Compression fracture: No Abdominal aneurysm: No  COGNITION: Overall cognitive status: Within functional limits for tasks assessed     SENSATION: Not tested, but reports no hx of this   MUSCLE LENGTH:   POSTURE: rounded shoulders, forward head, decreased lumbar lordosis, increased thoracic kyphosis, and flexed trunk   PALPATION: Severe trigger points and mm spasms noted L rotator cuff, L latissimus, thoracic paraspinals- extremely sensitive   LUMBAR ROM:   AROM eval  Flexion Mild limitation   Extension Severe limitation   Right lateral flexion Moderate limitation   Left lateral flexion Moderate limitation  Right rotation   Left rotation    (Blank rows = not tested)  Thoracic ROM: flexion WNL, extension severe limitation, lateral flexion mild limitation B, R rotation severe limitation, L moderate limitation  LOWER EXTREMITY ROM:     Active  Right eval Left eval  Hip flexion 3 3  Hip extension 2 at best (from bridge, unable to lift hips even with assist of HS) 2 at best (from bridge, unable to lift hips even with assist  of HS)  Hip abduction 3 3+  Hip adduction    Hip internal  rotation    Hip external rotation    Knee flexion 3 3  Knee extension 4- 4-  Ankle dorsiflexion 4 4  Ankle plantarflexion    Ankle inversion    Ankle eversion     (Blank rows = not tested)  LOWER EXTREMITY MMT:    MMT Right eval Left eval  Hip flexion    Hip extension    Hip abduction    Hip adduction    Hip internal rotation    Hip external rotation    Knee flexion    Knee extension    Ankle dorsiflexion    Ankle plantarflexion    Ankle inversion    Ankle eversion     (Blank rows = not tested)  LUMBAR SPECIAL TESTS:    FUNCTIONAL TESTS:  3 minute walk test: 570f no rest breaks no device  Dynamic Gait Index: 13/24  GAIT: Distance walked: 5246fAssistive device utilized: None Level of assistance: Complete Independence Comments: limited trunk rotation, trendelenburg, flexed at hips, limited ankle DF B   TODAY'S TREATMENT:                                                                                                                              Pt seen for aquatic therapy today.  Treatment took place in water 3.25-4 ft in [facility] Temp of water was 90.  Pt entered/exited the pool via stairs with hand rail.  * without UE support: walking forward/backward, cues for relaxed neck and arms * side stepping without/ with arm addct / abdct * high knee marching with arm swing  * holding wall: Hip abdct/ addct  * wall push ups, cues for chin tuck for neutral x 10 * with fins on arms:  standard stance - shoulder horiz abdct/ add; shoulder addct/abdct, bilat shoulder ER/IR, elbow flexion, elbow/shoulder ext * return to walking with reciprocal arm swing (with fins on wrists) * short blue noodle push down with elbows straight/bent x 10 (like using a wash board), then pull downs with arms straight * kickboard push/pull in staggered stance x 10 each * return to walking forward/ backward    Pt requires the buoyancy and hydrostatic pressure of water for support, and to offload joints by unweighting joint load by at least 50 % in navel deep water and by at least 75-80% in chest to neck deep water.  Viscosity of the water is needed for resistance of strengthening. Water current perturbations provides challenge to standing balance requiring increased core activation.     PATIENT EDUCATION:  Education details: aquatic progressions and modifications  Person educated: Patient and Spouse Education method: Explanation Education comprehension: verbalized understanding  HOME EXERCISE PROGRAM: TBA  ASSESSMENT:  CLINICAL IMPRESSION: Pt arrives with elevated pain level and reports of increased stiffness. Modified exercises and the intensity to pt's tolerance.   She prefers depth just shy of 4 ft. Pt tolerated fins on wrists instead of  using hand floats today with similar UE strengthening exercises. Pain reduced to 3/10 during session.    Goals are ongoing.  Plan to trial DGI items in the water: walking with gentle vertical head turns, stairs, stepping over obstacles.   Will benefit from skilled PT services to address functional limitations, reduce fall risk, and optimize overall level of function moving forward.   OBJECTIVE IMPAIRMENTS: Abnormal gait, decreased activity tolerance, decreased balance, decreased mobility, difficulty walking, decreased ROM, decreased strength, decreased safety awareness, hypomobility, increased fascial restrictions, impaired flexibility, improper body mechanics, postural dysfunction, and pain.   ACTIVITY LIMITATIONS: standing, squatting, stairs, transfers, and locomotion level  PARTICIPATION LIMITATIONS: meal prep, cleaning, laundry, shopping, community activity, yard work, and church  PERSONAL FACTORS: Age, Behavior pattern, Fitness, Past/current experiences, and Time since onset of injury/illness/exacerbation are also affecting patient's functional  outcome.   REHAB POTENTIAL: Good  CLINICAL DECISION MAKING: Evolving/moderate complexity  EVALUATION COMPLEXITY: Moderate   GOALS: Goals reviewed with patient? Yes  SHORT TERM GOALS: Target date: 05/13/2022    Will be compliant with appropriate progressive HEP  Baseline: Goal status: INITIAL  2.  Lumbar and thoracic ROM to be no more than 25% limited on all planes  Baseline:  Goal status: INITIAL  3.  Muscle spasms to have improved by at least 50% to assist in pain control  Baseline:  Goal status: INITIAL  4.  Pain to be no more than 5/10 at worst  Baseline:  Goal status: INITIAL    LONG TERM GOALS: Target date: 06/10/2022    Will score at least 4/5 on MMT in order to show improved functional strength  Baseline:  Goal status: INITIAL  2.  Gait mechanics to have improved with resolution of deficits in ankle DF during swing phase to reduce fall risk  Baseline:  Goal status: INITIAL  3.  Will score at least 18 on DGI to show reduced fall risk and improve ease of community access for safe travel  Baseline:  Goal status: INITIAL  4.  Will be able to ambulate at least 611f on 3MWT to show improved mobility and ability to access community for safe travel  Baseline:  Goal status: INITIAL  5.  Will be compliant with appropriate gym based exercise program to maintain functional gains and prevent falls/recurrence of severe pain  Baseline:  Goal status: INITIAL   PLAN:  PT FREQUENCY: 2x/week  PT DURATION: 8 weeks- first 4 in water, second 4 on land   PLANNED INTERVENTIONS: Therapeutic exercises, Therapeutic activity, Neuromuscular re-education, Balance training, Gait training, Patient/Family education, Self Care, Joint mobilization, Aquatic Therapy, Dry Needling, Electrical stimulation, Spinal mobilization, Cryotherapy, Moist heat, Taping, Ultrasound, Ionotophoresis 467mml Dexamethasone, Manual therapy, and Re-evaluation.  PLAN FOR NEXT SESSION: needs HEP; slow but  steady progression of strength, balance, lumbar/thoracic ROM, pain control - assess STGs  JeKerin PernaPTA 05/12/22 10:30 AM [facility] [id number]: [phone] Fax:  [phone]

## 2022-05-14 ENCOUNTER — Encounter (HOSPITAL_BASED_OUTPATIENT_CLINIC_OR_DEPARTMENT_OTHER): Payer: Self-pay | Admitting: Physical Therapy

## 2022-05-14 ENCOUNTER — Ambulatory Visit (HOSPITAL_BASED_OUTPATIENT_CLINIC_OR_DEPARTMENT_OTHER): Payer: Medicare Other | Admitting: Physical Therapy

## 2022-05-14 DIAGNOSIS — M546 Pain in thoracic spine: Secondary | ICD-10-CM

## 2022-05-14 DIAGNOSIS — M6281 Muscle weakness (generalized): Secondary | ICD-10-CM

## 2022-05-14 DIAGNOSIS — M5459 Other low back pain: Secondary | ICD-10-CM

## 2022-05-14 NOTE — Therapy (Signed)
OUTPATIENT PHYSICAL THERAPY THORACOLUMBAR TREATMENT   Patient Name: Heather Hodges MRN: [id number] DOB:[birth year], 77 y.o., female Today's Date: 05/14/2022  END OF SESSION:  PT End of Session - 05/14/22 0822     Visit Number 7    Number of Visits 17    Date for PT Re-Evaluation 06/10/22    Authorization Type MCR and [employer]    Authorization Time Period 04/15/22 to 06/24/22    Progress Note Due on Visit 10    PT Start Time 0815    PT Stop Time 0855    PT Time Calculation (min) 40 min    Behavior During Therapy [facility] for tasks assessed/performed              Past Medical History:  Diagnosis Date   Arthritis    Colon polyps    Diverticulosis    History of chicken pox    Hyperlipidemia    Hypothyroidism    Osteoporosis    Vitamin D deficiency    Past Surgical History:  Procedure Laterality Date   BREAST BIOPSY Left    Benign   TONSILLECTOMY AND ADENOIDECTOMY     Patient Active Problem List   Diagnosis Date Noted   Fibromyalgia 02/11/2021   Chronic neck pain 02/11/2021   Osteoporosis    Hypothyroidism    Hyperlipidemia    Diverticulosis    Arthritis    Chronic pain syndrome 02/03/2018   Contusion of left lung 07/20/2017   Cerebral contusion ([place]) 07/17/2017   Traumatic pneumothorax 07/17/2017   Rib fractures 07/16/2017   Vitamin D deficiency 08/06/2015    PCP: Inda Coke PA-C  REFERRING PROVIDER: Gregor Hams, MD  REFERRING DIAG: M54.50,G89.29 (ICD-10-CM) - Chronic bilateral low back pain without sciatica M54.6,G89.29 (ICD-10-CM) - Chronic bilateral thoracic back pain  Rationale for Evaluation and Treatment: Rehabilitation  THERAPY DIAG:  Other low back pain  Pain in thoracic spine  Muscle weakness (generalized)  ONSET DATE: 04/03/2022  SUBJECTIVE:                                                                                                                                                                                            SUBJECTIVE STATEMENT:  Pt reports her body feels much better today with the improvement of the weather.  She has her injections in neck scheduled for Monday.  She reports she has no plans to join facility with pool, but has friend with outdoor pool she could use in the summer.   She states she hasn't noticed any improvement in her balance yet.  "I haven't done much walking in neighborhood yet because it has  been so wet".    PERTINENT HISTORY:  77 y.o. female with concussion complicated by acute exacerbation of chronic back pain.  She had a fall almost a month ago now and suffered a concussion as well as worsening of her back pain.   Her diffuse soreness and especially her thoracic and lumbar back pain are her chief problems now.  She is reluctant to consider [facility] as she feels that hurt her previously.  Her primary care provider suggested aquatic physical therapy and I think that is a great idea.  She is willing to consider it as well.  Plan to refer to aquatic physical therapy.   She is having trouble sleeping especially due to the body soreness and back soreness.  Will try low-dose methocarbamol at bedtime.  If that does not work we can try other muscle relaxers or other medications.   Recheck in 3 weeks.    PAIN:  Are you having pain? Yes: NPRS scale: 4-5/10 Pain location: generalized Pain description: ache  Aggravating factors: movement in general, little of everything  Relieving factors: tylenol OA but "depends on what I've been doing"   PRECAUTIONS: Other: fall, hx of compression fractures   WEIGHT BEARING RESTRICTIONS: No  FALLS:  Has patient fallen in last 6 months? Yes. Number of falls 1; (-) FOF  LIVING ENVIRONMENT: Lives with: lives with their family Lives in: Other townhome  Stairs: no steps inside or outside  Has following equipment at home: Single point cane  OCCUPATION: retired   PLOF: Independent, Independent with basic ADLs, Independent with  gait, and Independent with transfers  PATIENT GOALS: be able to travel again safely, get loosened up and feeling better   NEXT MD VISIT: Dr. Georgina Snell next week   OBJECTIVE:   DIAGNOSTIC FINDINGS:  FINDINGS: There is no evidence of thoracic spine fracture. Chronic compression deformity of thoracic vertebral bodies (upper and mid) are unchanged compared to prior exam. Mild anterior spurring is identified in the thoracic spine. Alignment is normal. No other significant bone abnormalities are identified.   IMPRESSION: No acute abnormality. Chronic compression deformity of thoracic vertebral bodies are unchanged compared to prior exam.  FINDINGS: There is no evidence of lumbar spine fracture. Grade 1 anterolisthesis of L4 on L5 is unchanged. Facet joint sclerosis is identified in the mid to lower lumbar spine.   IMPRESSION: No acute fracture or dislocation. Degenerative joint changes of lumbar spine which appears stable compared prior exam.  PATIENT SURVEYS:  FOTO will do 2nd session   SCREENING FOR RED FLAGS: Bowel or bladder incontinence: No Spinal tumors: No Cauda equina syndrome: No Compression fracture: No Abdominal aneurysm: No  COGNITION: Overall cognitive status: Within functional limits for tasks assessed     SENSATION: Not tested, but reports no hx of this   MUSCLE LENGTH:   POSTURE: rounded shoulders, forward head, decreased lumbar lordosis, increased thoracic kyphosis, and flexed trunk   PALPATION: Severe trigger points and mm spasms noted L rotator cuff, L latissimus, thoracic paraspinals- extremely sensitive   LUMBAR ROM:   AROM eval  Flexion Mild limitation   Extension Severe limitation   Right lateral flexion Moderate limitation   Left lateral flexion Moderate limitation  Right rotation   Left rotation    (Blank rows = not tested)  Thoracic ROM: flexion WNL, extension severe limitation, lateral flexion mild limitation B, R rotation severe  limitation, L moderate limitation  LOWER EXTREMITY ROM:     Active  Right eval Left eval  Hip flexion 3 3  Hip extension 2 at best (from bridge, unable to lift hips even with assist of HS) 2 at best (from bridge, unable to lift hips even with assist of HS)  Hip abduction 3 3+  Hip adduction    Hip internal rotation    Hip external rotation    Knee flexion 3 3  Knee extension 4- 4-  Ankle dorsiflexion 4 4  Ankle plantarflexion    Ankle inversion    Ankle eversion     (Blank rows = not tested)  LOWER EXTREMITY MMT:    MMT Right eval Left eval  Hip flexion    Hip extension    Hip abduction    Hip adduction    Hip internal rotation    Hip external rotation    Knee flexion    Knee extension    Ankle dorsiflexion    Ankle plantarflexion    Ankle inversion    Ankle eversion     (Blank rows = not tested)  LUMBAR SPECIAL TESTS:    FUNCTIONAL TESTS:  3 minute walk test: 553f no rest breaks no device  Dynamic Gait Index: 13/24  GAIT: Distance walked: 5271fAssistive device utilized: None Level of assistance: Complete Independence Comments: limited trunk rotation, trendelenburg, flexed at hips, limited ankle DF B   TODAY'S TREATMENT:                                                                                                                              Pt seen for aquatic therapy today.  Treatment took place in water 3.25-4 ft in [facility] Temp of water was 90.  Pt entered/exited the pool via stairs with hand rail.  * without UE support: walking forward/backward, cues for relaxed neck and arms * side stepping without/ with arm addct / abdct * wall push ups, cues for chin tuck for neutral x 10 * tandem gait forward/ backward (no support) * short blue noodle push down with elbows straight/bent x 10 (like using a wash board), then pull downs with arms straight  * kickboard push/pull in staggered stance x 10 each * high knee marching  forward/backward with reciprocal arm swing * holding wall: Hip abdct/ addct x 10; hip ext x10 each  *  walking forward with gentle vertical and horiz head turns   Pt requires the buoyancy and hydrostatic pressure of water for support, and to offload joints by unweighting joint load by at least 50 % in navel deep water and by at least 75-80% in chest to neck deep water.  Viscosity of the water is needed for resistance of strengthening. Water current perturbations provides challenge to standing balance requiring increased core activation.   PATIENT EDUCATION:  Education details: aquatic progressions and modifications  Person educated: Patient and Spouse Education method: Explanation Education comprehension: verbalized understanding  HOME EXERCISE PROGRAM: TBA  ASSESSMENT:  CLINICAL IMPRESSION: Pt with improved exercise  tolerance from last session.  Continue focus on balance, core strengthening and exercise tolerance.  Her pain over the last few visits has been more in neck, but also generalized. Pt reported shooting pain in Lt side of head with attempt to turn head gently Lt with walking; subsided with rest in standing position.  She is making gradual progress towards all goals.     Plan to trial DGI items in the water/land: walking with gentle vertical head turns, stairs, stepping over obstacles.   Will benefit from skilled PT services to address functional limitations, reduce fall risk, and optimize overall level of function moving forward.   OBJECTIVE IMPAIRMENTS: Abnormal gait, decreased activity tolerance, decreased balance, decreased mobility, difficulty walking, decreased ROM, decreased strength, decreased safety awareness, hypomobility, increased fascial restrictions, impaired flexibility, improper body mechanics, postural dysfunction, and pain.   ACTIVITY LIMITATIONS: standing, squatting, stairs, transfers, and locomotion level  PARTICIPATION LIMITATIONS: meal prep, cleaning,  laundry, shopping, community activity, yard work, and church  PERSONAL FACTORS: Age, Behavior pattern, Fitness, Past/current experiences, and Time since onset of injury/illness/exacerbation are also affecting patient's functional outcome.   REHAB POTENTIAL: Good  CLINICAL DECISION MAKING: Evolving/moderate complexity  EVALUATION COMPLEXITY: Moderate   GOALS: Goals reviewed with patient? Yes  SHORT TERM GOALS: Target date: 05/13/2022    Will be compliant with appropriate progressive HEP  Baseline: Goal status: IN PROGRESS   2.  Lumbar and thoracic ROM to be no more than 25% limited on all planes  Baseline:  Goal status:IN PROGRESS   3.  Muscle spasms to have improved by at least 50% to assist in pain control  Baseline:  Goal status: IN PROGRESS   4.  Pain to be no more than 5/10 at worst  Baseline:  Goal status:IN PROGRESS - pain varies; often dependent on weather - 05/14/22    LONG TERM GOALS: Target date: 06/10/2022    Will score at least 4/5 on MMT in order to show improved functional strength  Baseline:  Goal status: INITIAL  2.  Gait mechanics to have improved with resolution of deficits in ankle DF during swing phase to reduce fall risk  Baseline:  Goal status: INITIAL  3.  Will score at least 18 on DGI to show reduced fall risk and improve ease of community access for safe travel  Baseline:  Goal status: INITIAL  4.  Will be able to ambulate at least 687f on 3MWT to show improved mobility and ability to access community for safe travel  Baseline:  Goal status: INITIAL  5.  Will be compliant with appropriate gym based exercise program to maintain functional gains and prevent falls/recurrence of severe pain  Baseline:  Goal status: INITIAL   PLAN:  PT FREQUENCY: 2x/week  PT DURATION: 8 weeks- first 4 in water, second 4 on land   PLANNED INTERVENTIONS: Therapeutic exercises, Therapeutic activity, Neuromuscular re-education, Balance training, Gait  training, Patient/Family education, Self Care, Joint mobilization, Aquatic Therapy, Dry Needling, Electrical stimulation, Spinal mobilization, Cryotherapy, Moist heat, Taping, Ultrasound, Ionotophoresis 433mml Dexamethasone, Manual therapy, and Re-evaluation.  PLAN FOR NEXT SESSION: needs HEP; slow but steady progression of strength, balance, lumbar/thoracic ROM, pain control - assess STGs

## 2022-05-19 ENCOUNTER — Encounter (HOSPITAL_BASED_OUTPATIENT_CLINIC_OR_DEPARTMENT_OTHER): Payer: Medicare Other

## 2022-05-21 ENCOUNTER — Ambulatory Visit (HOSPITAL_BASED_OUTPATIENT_CLINIC_OR_DEPARTMENT_OTHER): Payer: Medicare Other | Admitting: Physical Therapy

## 2022-05-21 ENCOUNTER — Encounter (HOSPITAL_BASED_OUTPATIENT_CLINIC_OR_DEPARTMENT_OTHER): Payer: Self-pay | Admitting: Physical Therapy

## 2022-05-21 DIAGNOSIS — M5459 Other low back pain: Secondary | ICD-10-CM

## 2022-05-21 DIAGNOSIS — M6281 Muscle weakness (generalized): Secondary | ICD-10-CM

## 2022-05-21 DIAGNOSIS — M546 Pain in thoracic spine: Secondary | ICD-10-CM

## 2022-05-21 DIAGNOSIS — R252 Cramp and spasm: Secondary | ICD-10-CM

## 2022-05-21 NOTE — Therapy (Signed)
OUTPATIENT PHYSICAL THERAPY THORACOLUMBAR TREATMENT   Patient Name: Heather Hodges MRN: [id number] DOB:Apr 04, 1945, 77 y.o., female Today's Date: 05/21/2022  END OF SESSION:     Past Medical History:  Diagnosis Date   Arthritis    Colon polyps    Diverticulosis    History of chicken pox    Hyperlipidemia    Hypothyroidism    Osteoporosis    Vitamin D deficiency    Past Surgical History:  Procedure Laterality Date   BREAST BIOPSY Left    Benign   TONSILLECTOMY AND ADENOIDECTOMY     Patient Active Problem List   Diagnosis Date Noted   Fibromyalgia 02/11/2021   Chronic neck pain 02/11/2021   Osteoporosis    Hypothyroidism    Hyperlipidemia    Diverticulosis    Arthritis    Chronic pain syndrome 02/03/2018   Contusion of left lung 07/20/2017   Cerebral contusion ([place]) 07/17/2017   Traumatic pneumothorax 07/17/2017   Rib fractures 07/16/2017   Vitamin D deficiency 08/06/2015    PCP: Inda Coke PA-C  REFERRING PROVIDER: Gregor Hams, MD  REFERRING DIAG: M54.50,G89.29 (ICD-10-CM) - Chronic bilateral low back pain without sciatica M54.6,G89.29 (ICD-10-CM) - Chronic bilateral thoracic back pain  Rationale for Evaluation and Treatment: Rehabilitation  THERAPY DIAG:  No diagnosis found.  ONSET DATE: 04/03/2022  SUBJECTIVE:                                                                                                                                                                                           SUBJECTIVE STATEMENT:  The patient reports her neck is tight today. She is not sure if the shot has helped yet. She has had PT in the past and is not sure if the land therapy is going to work.  PERTINENT HISTORY:  77 y.o. female with concussion complicated by acute exacerbation of chronic back pain.  She had a fall almost a month ago now and suffered a concussion as well as worsening of her back pain.   Her diffuse soreness and especially her thoracic  and lumbar back pain are her chief problems now.  She is reluctant to consider [place] physical therapy as she feels that hurt her previously.  Her primary care provider suggested aquatic physical therapy and I think that is a great idea.  She is willing to consider it as well.  Plan to refer to aquatic physical therapy.   She is having trouble sleeping especially due to the body soreness and back soreness.  Will try low-dose methocarbamol at bedtime.  If that does not work we can try other muscle relaxers or other  medications.   Recheck in 3 weeks.    PAIN:  Are you having pain? Yes: NPRS scale: 4-5/10 Pain location: generalized Pain description: ache  Aggravating factors: movement in general, little of everything  Relieving factors: tylenol OA but "depends on what I've been doing"   PRECAUTIONS: Other: fall, hx of compression fractures   WEIGHT BEARING RESTRICTIONS: No  FALLS:  Has patient fallen in last 6 months? Yes. Number of falls 1; (-) FOF  LIVING ENVIRONMENT: Lives with: lives with their family Lives in: Other townhome  Stairs: no steps inside or outside  Has following equipment at home: Single point cane  OCCUPATION: retired   PLOF: Independent, Independent with basic ADLs, Independent with gait, and Independent with transfers  PATIENT GOALS: be able to travel again safely, get loosened up and feeling better   NEXT MD VISIT: Dr. Georgina Snell next week   OBJECTIVE:   DIAGNOSTIC FINDINGS:  FINDINGS: There is no evidence of thoracic spine fracture. Chronic compression deformity of thoracic vertebral bodies (upper and mid) are unchanged compared to prior exam. Mild anterior spurring is identified in the thoracic spine. Alignment is normal. No other significant bone abnormalities are identified.   IMPRESSION: No acute abnormality. Chronic compression deformity of thoracic vertebral bodies are unchanged compared to prior exam.  FINDINGS: There is no evidence of lumbar  spine fracture. Grade 1 anterolisthesis of L4 on L5 is unchanged. Facet joint sclerosis is identified in the mid to lower lumbar spine.   IMPRESSION: No acute fracture or dislocation. Degenerative joint changes of lumbar spine which appears stable compared prior exam.  PATIENT SURVEYS:  FOTO will do 2nd session   SCREENING FOR RED FLAGS: Bowel or bladder incontinence: No Spinal tumors: No Cauda equina syndrome: No Compression fracture: No Abdominal aneurysm: No  COGNITION: Overall cognitive status: Within functional limits for tasks assessed     SENSATION: Not tested, but reports no hx of this   MUSCLE LENGTH:   POSTURE: rounded shoulders, forward head, decreased lumbar lordosis, increased thoracic kyphosis, and flexed trunk   PALPATION: Severe trigger points and mm spasms noted L rotator cuff, L latissimus, thoracic paraspinals- extremely sensitive   LUMBAR ROM:   AROM eval  Flexion Mild limitation   Extension Severe limitation   Right lateral flexion Moderate limitation   Left lateral flexion Moderate limitation  Right rotation   Left rotation    (Blank rows = not tested)  Thoracic ROM: flexion WNL, extension severe limitation, lateral flexion mild limitation B, R rotation severe limitation, L moderate limitation  LOWER EXTREMITY ROM:     Active  Right eval Left eval  Hip flexion 3 3  Hip extension 2 at best (from bridge, unable to lift hips even with assist of HS) 2 at best (from bridge, unable to lift hips even with assist of HS)  Hip abduction 3 3+  Hip adduction    Hip internal rotation    Hip external rotation    Knee flexion 3 3  Knee extension 4- 4-  Ankle dorsiflexion 4 4  Ankle plantarflexion    Ankle inversion    Ankle eversion     (Blank rows = not tested)  LOWER EXTREMITY MMT:    MMT Right eval Left eval  Hip flexion    Hip extension    Hip abduction    Hip adduction    Hip internal rotation    Hip external rotation    Knee  flexion    Knee extension  Ankle dorsiflexion    Ankle plantarflexion    Ankle inversion    Ankle eversion     (Blank rows = not tested)  LUMBAR SPECIAL TESTS:    FUNCTIONAL TESTS:  3 minute walk test: 558f no rest breaks no device  Dynamic Gait Index: 13/24  GAIT: Distance walked: 5274fAssistive device utilized: None Level of assistance: Complete Independence Comments: limited trunk rotation, trendelenburg, flexed at hips, limited ankle DF B   TODAY'S TREATMENT:                                                                                                                              2/21 Reviewed HEP and how to use it at home.  Thera cane use and where to purchase.  Upper trap stretch 2 x 20-second hold patient advised not to overdo  Levator stretch 2 x 20-second hold bilateral patient again advised not to overstretch  Row 2 x 10 yellow Shoulder extension 2 x 10 yellow      Last visit:  Pt seen for aquatic therapy today.  Treatment took place in water 3.25-4 ft in [facility]. Temp of water was 90.  Pt entered/exited the pool via stairs with hand rail.  * without UE support: walking forward/backward, cues for relaxed neck and arms * side stepping without/ with arm addct / abdct * wall push ups, cues for chin tuck for neutral x 10 * tandem gait forward/ backward (no support) * short blue noodle push down with elbows straight/bent x 10 (like using a wash board), then pull downs with arms straight  * kickboard push/pull in staggered stance x 10 each * high knee marching forward/backward with reciprocal arm swing * holding wall: Hip abdct/ addct x 10; hip ext x10 each  *  walking forward with gentle vertical and horiz head turns   Pt requires the buoyancy and hydrostatic pressure of water for support, and to offload joints by unweighting joint load by at least 50 % in navel deep water and by at least 75-80% in chest to neck deep water.   Viscosity of the water is needed for resistance of strengthening. Water current perturbations provides challenge to standing balance requiring increased core activation.   PATIENT EDUCATION:  Education details: aquatic progressions and modifications  Person educated: Patient and Spouse Education method: Explanation Education comprehension: verbalized understanding  HOME EXERCISE PROGRAM: TBA  ASSESSMENT:  CLINICAL IMPRESSION: The patient has several different areas of focus as far as her back and neck go.  We gave her a more neck based program today to work on.  She was advised to try her stretches and exercises and see which ones to help in which ones exacerbate her symptoms.  Next visit we will consider a low back stretching program as well as a seated quad and glutes strengthening program.  Will also review her current HEP.  She plans on bringing in her exercises from  previous episodes of PT.  We will look at the things that were working for try to build her a manageable long-term program.       Plan to trial DGI items in the water/land: walking with gentle vertical head turns, stairs, stepping over obstacles.   Will benefit from skilled PT services to address functional limitations, reduce fall risk, and optimize overall level of function moving forward.   OBJECTIVE IMPAIRMENTS: Abnormal gait, decreased activity tolerance, decreased balance, decreased mobility, difficulty walking, decreased ROM, decreased strength, decreased safety awareness, hypomobility, increased fascial restrictions, impaired flexibility, improper body mechanics, postural dysfunction, and pain.   ACTIVITY LIMITATIONS: standing, squatting, stairs, transfers, and locomotion level  PARTICIPATION LIMITATIONS: meal prep, cleaning, laundry, shopping, community activity, yard work, and church  PERSONAL FACTORS: Age, Behavior pattern, Fitness, Past/current experiences, and Time since onset of injury/illness/exacerbation  are also affecting patient's functional outcome.   REHAB POTENTIAL: Good  CLINICAL DECISION MAKING: Evolving/moderate complexity  EVALUATION COMPLEXITY: Moderate   GOALS: Goals reviewed with patient? Yes  SHORT TERM GOALS: Target date: 05/13/2022    Will be compliant with appropriate progressive HEP  Baseline: Goal status: IN Has based exercise program 2/21  2.  Lumbar and thoracic ROM to be no more than 25% limited on all planes  Baseline:  Goal status:IN not assessed today 2/21 3.  Muscle spasms to have improved by at least 50% to assist in pain control  Baseline:  Goal status:  Has muscle spasms 2/21 4.  Pain to be no more than 5/10 at worst  Baseline:  Goal status:IN PROGRESS - pain varies; often dependent on weather - 05/14/22    LONG TERM GOALS: Target date: 06/10/2022    Will score at least 4/5 on MMT in order to show improved functional strength  Baseline:  Goal status: INITIAL  2.  Gait mechanics to have improved with resolution of deficits in ankle DF during swing phase to reduce fall risk  Baseline:  Goal status: INITIAL  3.  Will score at least 18 on DGI to show reduced fall risk and improve ease of community access for safe travel  Baseline:  Goal status: INITIAL  4.  Will be able to ambulate at least 620f on 3MWT to show improved mobility and ability to access community for safe travel  Baseline:  Goal status: INITIAL  5.  Will be compliant with appropriate gym based exercise program to maintain functional gains and prevent falls/recurrence of severe pain  Baseline:  Goal status: INITIAL   PLAN:  PT FREQUENCY: 2x/week  PT DURATION: 8 weeks- first 4 in water, second 4 on land   PLANNED INTERVENTIONS: Therapeutic exercises, Therapeutic activity, Neuromuscular re-education, Balance training, Gait training, Patient/Family education, Self Care, Joint mobilization, Aquatic Therapy, Dry Needling, Electrical stimulation, Spinal mobilization,  Cryotherapy, Moist heat, Taping, Ultrasound, Ionotophoresis 462mml Dexamethasone, Manual therapy, and Re-evaluation.  PLAN FOR NEXT SESSION: needs HEP; slow but steady progression of strength, balance, lumbar/thoracic ROM, pain control - assess STGs

## 2022-05-22 ENCOUNTER — Encounter: Payer: Self-pay | Admitting: Family Medicine

## 2022-05-22 ENCOUNTER — Ambulatory Visit (INDEPENDENT_AMBULATORY_CARE_PROVIDER_SITE_OTHER): Payer: Medicare Other | Admitting: Family Medicine

## 2022-05-22 VITALS — BP 110/64 | HR 82 | Ht 60.0 in | Wt 133.4 lb

## 2022-05-22 DIAGNOSIS — M542 Cervicalgia: Secondary | ICD-10-CM | POA: Diagnosis not present

## 2022-05-22 DIAGNOSIS — G8929 Other chronic pain: Secondary | ICD-10-CM | POA: Diagnosis not present

## 2022-05-22 DIAGNOSIS — S060X0D Concussion without loss of consciousness, subsequent encounter: Secondary | ICD-10-CM

## 2022-05-22 MED ORDER — DULOXETINE HCL 40 MG PO CPEP: 40.0000 mg | ORAL_CAPSULE | Freq: Every day | ORAL | 1 refills | Status: DC

## 2022-05-22 NOTE — Progress Notes (Signed)
Subjective:   I, Heather Hodges, CMA acting as a scribe for Heather Leader, MD.  Chief Complaint: Heather Hodges,  is a 77 y.o. female who presents for f/u concussion and associated MSK related pains. Pt suffered a fall on 12/7, when walking to the [employer] store and mis-judging the height of the step, falling forward. No LOC. Pt til was last seen by Dr. Georgina Snell on 04/24/22 and was referred to pain management and advised to cont PT, completing 8 visits. Today, pt reports improvement of Has after ESI, was still having level 6 HA  until inj.   She notes continued mood symptoms.  She is currently taking duloxetine 30 mg at bedtime.  She is taking gabapentin as well and wants to know if she can stop it.  She does not think it helps.  Dx imaging: 04/24/22 C-spine XR 04/03/22 L-spine & T-spine XR 03/11/22 Head CT and R wrist, R knee, R foot, & R ankle XR    Injury date : 03/06/22 Visit #: 4  History of Present Illness:   Concussion Self-Reported Symptom Score Symptoms rated on a scale 1-6, in last 24 hours  Headache: 0   Pressure in head: 3 Neck pain: 1 Nausea or vomiting: 0 Dizziness: 0  Blurred vision: 2  Balance problems: 4 Sensitivity to light:  6 Sensitivity to noise: 6 Feeling slowed down: 6 Feeling like "in a fog": 2 "Don't feel right": 6 Difficulty concentrating: 2 Difficulty remembering: 3 Fatigue or low energy: 4 Confusion: 1 Drowsiness: 4 More emotional: 6 Irritability: 3 Sadness: 6 Nervous or anxious: 4 Trouble falling asleep: 2   Total # of Symptoms: 19 Total Symptom Score: 107  Previous Total # of Symptoms: 19/22 Previous Symptom Score: 85/132  Tinnitus: Yes/No  Review of Systems: No fevers or chills.  Positive for neck pain.    Review of History: Cerebral contusion history.  Right rib fracture history.  Chronic pain syndrome history.  Objective:    Physical Examination Vitals:   05/22/22 0817  BP: 110/64  Pulse: 82  SpO2: 98%   MSK: Decreased  cervical motion. Neuro: Alert and oriented normal coordination.  Slow gait. Psych: Normal speech thought process and affect.   Assessment and Plan   77 y.o. female with concussion.  Still quite symptomatic at times.  Headache and neck pain hopefully will improve with further care from pain management with Dr. Davy Pique.  She had 1 injection earlier this week that I am hopeful will be beneficial.  She is a follow-up appointment scheduled I think next week with their office.  Her mood is not well-controlled.  Will increase Cymbalta from 30 mg at bedtime to 40 mg at bedtime.  We talked about weaning off of gabapentin.  She does not think it helps so she can decrease the dose and eventually stop it over a period of about a week.  Recheck in 1 month.    Action/Discussion: Reviewed diagnosis, management options, expected outcomes, and the reasons for scheduled and emergent follow-up. Questions were adequately answered. Patient expressed verbal understanding and agreement with the following plan.     Patient Education: Reviewed with patient the risks (i.e, a repeat concussion, post-concussion syndrome, second-impact syndrome) of returning to play prior to complete resolution, and thoroughly reviewed the signs and symptoms of concussion.Reviewed need for complete resolution of all symptoms, with rest AND exertion, prior to return to play. Reviewed red flags for urgent medical evaluation: worsening symptoms, nausea/vomiting, intractable headache, musculoskeletal changes, focal neurological deficits.  [facility] Concussion Care Plan, which clearly outlines the plans stated above, was given to patient.   Level of service: Total encounter time 30 minutes including face-to-face time with the patient and, reviewing past medical record, and charting on the date of service.        After Visit Summary printed out and provided to patient as appropriate.  The above documentation has been  reviewed and is accurate and complete Heather Hodges

## 2022-05-22 NOTE — Patient Instructions (Addendum)
Thank you for coming in today.  Try loop hearing plug.   Continue PT.   Ok to try quitting gabapentin. Decrease the dose down to 1 at a time.   Continue duloxetine. Increase the dose to 38m at bedtime when you get the new pills from [facility] next week.   Return in 1 month.

## 2022-05-23 ENCOUNTER — Encounter (HOSPITAL_BASED_OUTPATIENT_CLINIC_OR_DEPARTMENT_OTHER): Payer: Self-pay | Admitting: Physical Therapy

## 2022-05-23 ENCOUNTER — Ambulatory Visit (HOSPITAL_BASED_OUTPATIENT_CLINIC_OR_DEPARTMENT_OTHER): Payer: Medicare Other | Admitting: Physical Therapy

## 2022-05-23 DIAGNOSIS — M5459 Other low back pain: Secondary | ICD-10-CM

## 2022-05-23 DIAGNOSIS — M546 Pain in thoracic spine: Secondary | ICD-10-CM

## 2022-05-23 DIAGNOSIS — M6281 Muscle weakness (generalized): Secondary | ICD-10-CM

## 2022-05-23 NOTE — Therapy (Signed)
OUTPATIENT PHYSICAL THERAPY THORACOLUMBAR TREATMENT   Patient Name: Heather Hodges MRN: [id number] DOB:1945/04/09, 77 y.o., female Today's Date: 05/23/2022  END OF SESSION:  PT End of Session - 05/23/22 0820     Visit Number 9    Number of Visits 17    Date for PT Re-Evaluation 06/10/22    Authorization Type M[employer] and [employer]    Authorization Time Period 04/15/22 to 06/24/22    Progress Note Due on Visit 10    PT Start Time 0811    PT Stop Time 0850    PT Time Calculation (min) 39 min    Behavior During Therapy [facility] for tasks assessed/performed               Past Medical History:  Diagnosis Date   Arthritis    Colon polyps    Diverticulosis    History of chicken pox    Hyperlipidemia    Hypothyroidism    Osteoporosis    Vitamin D deficiency    Past Surgical History:  Procedure Laterality Date   BREAST BIOPSY Left    Benign   TONSILLECTOMY AND ADENOIDECTOMY     Patient Active Problem List   Diagnosis Date Noted   Fibromyalgia 02/11/2021   Chronic neck pain 02/11/2021   Osteoporosis    Hypothyroidism    Hyperlipidemia    Diverticulosis    Arthritis    Chronic pain syndrome 02/03/2018   Contusion of left lung 07/20/2017   Cerebral contusion ([place]) 07/17/2017   Traumatic pneumothorax 07/17/2017   Rib fractures 07/16/2017   Vitamin D deficiency 08/06/2015    PCP: Inda Coke PA-C  REFERRING PROVIDER: Gregor Hams, MD  REFERRING DIAG: M54.50,G89.29 (ICD-10-CM) - Chronic bilateral low back pain without sciatica M54.6,G89.29 (ICD-10-CM) - Chronic bilateral thoracic back pain  Rationale for Evaluation and Treatment: Rehabilitation  THERAPY DIAG:  Other low back pain  Pain in thoracic spine  Muscle weakness (generalized)  ONSET DATE: 04/03/2022  SUBJECTIVE:                                                                                                                                                                                            SUBJECTIVE STATEMENT:  The patient reports that she had been taking her medication wrong; didn't know she was supposed to take it at night.  She took it (cymbalta) last night and slept well.  "I feel more alert this morning".  PERTINENT HISTORY:  77 y.o. female with concussion complicated by acute exacerbation of chronic back pain.  She had a fall almost a month ago now and suffered a concussion as well as  worsening of her back pain.   Her diffuse soreness and especially her thoracic and lumbar back pain are her chief problems now.  She is reluctant to consider [facility] as she feels that hurt her previously.  Her primary care provider suggested aquatic physical therapy and I think that is a great idea.  She is willing to consider it as well.  Plan to refer to aquatic physical therapy.   She is having trouble sleeping especially due to the body soreness and back soreness.  Will try low-dose methocarbamol at bedtime.  If that does not work we can try other muscle relaxers or other medications.   Recheck in 3 weeks.    PAIN:  Are you having pain? Yes: NPRS scale: 2/10 Pain location: generalized Pain description: ache  Aggravating factors: movement in general, little of everything  Relieving factors: tylenol OA but "depends on what I've been doing"   PRECAUTIONS: Other: fall, hx of compression fractures   WEIGHT BEARING RESTRICTIONS: No  FALLS:  Has patient fallen in last 6 months? Yes. Number of falls 1; (-) FOF  LIVING ENVIRONMENT: Lives with: lives with their family Lives in: Other townhome  Stairs: no steps inside or outside  Has following equipment at home: Single point cane  OCCUPATION: retired   PLOF: Independent, Independent with basic ADLs, Independent with gait, and Independent with transfers  PATIENT GOALS: be able to travel again safely, get loosened up and feeling better   NEXT MD VISIT: Dr. Georgina Snell next week   OBJECTIVE:   DIAGNOSTIC FINDINGS:   FINDINGS: There is no evidence of thoracic spine fracture. Chronic compression deformity of thoracic vertebral bodies (upper and mid) are unchanged compared to prior exam. Mild anterior spurring is identified in the thoracic spine. Alignment is normal. No other significant bone abnormalities are identified.   IMPRESSION: No acute abnormality. Chronic compression deformity of thoracic vertebral bodies are unchanged compared to prior exam.  FINDINGS: There is no evidence of lumbar spine fracture. Grade 1 anterolisthesis of L4 on L5 is unchanged. Facet joint sclerosis is identified in the mid to lower lumbar spine.   IMPRESSION: No acute fracture or dislocation. Degenerative joint changes of lumbar spine which appears stable compared prior exam.  PATIENT SURVEYS:  FOTO will do 2nd session   SCREENING FOR RED FLAGS: Bowel or bladder incontinence: No Spinal tumors: No Cauda equina syndrome: No Compression fracture: No Abdominal aneurysm: No  COGNITION: Overall cognitive status: Within functional limits for tasks assessed     SENSATION: Not tested, but reports no hx of this   MUSCLE LENGTH:   POSTURE: rounded shoulders, forward head, decreased lumbar lordosis, increased thoracic kyphosis, and flexed trunk   PALPATION: Severe trigger points and mm spasms noted L rotator cuff, L latissimus, thoracic paraspinals- extremely sensitive   LUMBAR ROM:   AROM eval  Flexion Mild limitation   Extension Severe limitation   Right lateral flexion Moderate limitation   Left lateral flexion Moderate limitation  Right rotation   Left rotation    (Blank rows = not tested)  Thoracic ROM: flexion WNL, extension severe limitation, lateral flexion mild limitation B, R rotation severe limitation, L moderate limitation  LOWER EXTREMITY ROM:     Active  Right eval Left eval  Hip flexion 3 3  Hip extension 2 at best (from bridge, unable to lift hips even with assist of HS) 2 at best  (from bridge, unable to lift hips even with assist of HS)  Hip abduction 3  3+  Hip adduction    Hip internal rotation    Hip external rotation    Knee flexion 3 3  Knee extension 4- 4-  Ankle dorsiflexion 4 4  Ankle plantarflexion    Ankle inversion    Ankle eversion     (Blank rows = not tested)  LOWER EXTREMITY MMT:    MMT Right eval Left eval  Hip flexion    Hip extension    Hip abduction    Hip adduction    Hip internal rotation    Hip external rotation    Knee flexion    Knee extension    Ankle dorsiflexion    Ankle plantarflexion    Ankle inversion    Ankle eversion     (Blank rows = not tested)  LUMBAR SPECIAL TESTS:    FUNCTIONAL TESTS:  3 minute walk test: 54f no rest breaks no device  Dynamic Gait Index: 13/24  GAIT: Distance walked: 5236fAssistive device utilized: None Level of assistance: Complete Independence Comments: limited trunk rotation, trendelenburg, flexed at hips, limited ankle DF B   TODAY'S TREATMENT:                                                                                                                                                                                                                                                          Pt seen for aquatic therapy today.  Treatment took place in water 3.25-4 ft in [facility]. Temp of water was 91.  Pt entered/exited the pool via stairs with hand rail.   * without UE support: walking forward/backward * side stepping without/ with arm addct / abdct * short blue noodle push down with elbows straight/bent x 10 (like using a wash board), then pull downs with arms straight x 10 * wall push ups, cues for chin tuck for neutral x 10 * high knee marching backward/ forward with reciprocal arm swing * tandem gait forward/ backward (no support) * holding wall: Hip abdct/ addct x 10; hip ext x10 each  * kickboard push/pull in staggered stance x 10 each * gentle  trunk rotation with hands resting flat on kick board * without support:  heel raises * return to walking forward/backward with reciprocal arm swing Pt requires the buoyancy and  hydrostatic pressure of water for support, and to offload joints by unweighting joint load by at least 50 % in navel deep water and by at least 75-80% in chest to neck deep water.  Viscosity of the water is needed for resistance of strengthening. Water current perturbations provides challenge to standing balance requiring increased core activation.  2/21: Reviewed HEP and how to use it at home. Thera cane use and where to purchase. Upper trap stretch 2 x 20-second hold patient advised not to overdo Levator stretch 2 x 20-second hold bilateral patient again advised not to overstretch Row 2 x 10 yellow Shoulder extension 2 x 10 yellow   PATIENT EDUCATION:  Education details: aquatic progressions and modifications  Person educated: Patient and Spouse Education method: Explanation Education comprehension: verbalized understanding  HOME EXERCISE PROGRAM: TBA  ASSESSMENT:  CLINICAL IMPRESSION:  Pt tolerated all aquatic exercises well, without production of increased symptoms.  Tandem gait remains a challenge for her.     Next visit we will consider a low back stretching program as well as a seated quad and glutes strengthening program.  Will also review her current HEP.  She plans on bringing in her exercises from previous episodes of PT.  We will look at the things that were working for try to build her a manageable long-term program. Will benefit from skilled PT services to address functional limitations, reduce fall risk, and optimize overall level of function moving forward.  Progressing gradually towards goals.    10th visit progress note next.     OBJECTIVE IMPAIRMENTS: Abnormal gait, decreased activity tolerance, decreased balance, decreased mobility, difficulty walking, decreased ROM, decreased strength,  decreased safety awareness, hypomobility, increased fascial restrictions, impaired flexibility, improper body mechanics, postural dysfunction, and pain.   ACTIVITY LIMITATIONS: standing, squatting, stairs, transfers, and locomotion level  PARTICIPATION LIMITATIONS: meal prep, cleaning, laundry, shopping, community activity, yard work, and church  PERSONAL FACTORS: Age, Behavior pattern, Fitness, Past/current experiences, and Time since onset of injury/illness/exacerbation are also affecting patient's functional outcome.   REHAB POTENTIAL: Good  CLINICAL DECISION MAKING: Evolving/moderate complexity  EVALUATION COMPLEXITY: Moderate   GOALS: Goals reviewed with patient? Yes  SHORT TERM GOALS: Target date: 05/13/2022    Will be compliant with appropriate progressive HEP  Baseline: Goal status: IN Has based exercise program 2/21  2.  Lumbar and thoracic ROM to be no more than 25% limited on all planes  Baseline:  Goal status:IN not assessed today 2/21 3.  Muscle spasms to have improved by at least 50% to assist in pain control  Baseline:  Goal status:  Has muscle spasms 2/21 4.  Pain to be no more than 5/10 at worst  Baseline:  Goal status:IN PROGRESS - pain varies; often dependent on weather - 05/14/22    LONG TERM GOALS: Target date: 06/10/2022    Will score at least 4/5 on MMT in order to show improved functional strength  Baseline:  Goal status: INITIAL  2.  Gait mechanics to have improved with resolution of deficits in ankle DF during swing phase to reduce fall risk  Baseline:  Goal status: INITIAL  3.  Will score at least 18 on DGI to show reduced fall risk and improve ease of community access for safe travel  Baseline:  Goal status: INITIAL  4.  Will be able to ambulate at least 633f on 3MWT to show improved mobility and ability to access community for safe travel  Baseline:  Goal status: INITIAL  5.  Will be  compliant with appropriate gym based exercise  program to maintain functional gains and prevent falls/recurrence of severe pain  Baseline:  Goal status: INITIAL   PLAN:  PT FREQUENCY: 2x/week  PT DURATION: 8 weeks- first 4 in water, second 4 on land   PLANNED INTERVENTIONS: Therapeutic exercises, Therapeutic activity, Neuromuscular re-education, Balance training, Gait training, Patient/Family education, Self Care, Joint mobilization, Aquatic Therapy, Dry Needling, Electrical stimulation, Spinal mobilization, Cryotherapy, Moist heat, Taping, Ultrasound, Ionotophoresis '4mg'$ /ml Dexamethasone, Manual therapy, and Re-evaluation.  PLAN FOR NEXT SESSION:  slow but steady progression of strength, balance, lumbar/thoracic ROM, pain control - assess STGs and 3MWT.   Kerin Perna, PTA 05/23/22 8:53 AM [facility] [place], [place], [address] Phone: [phone]   Fax:  [phone]

## 2022-05-26 ENCOUNTER — Encounter: Payer: Self-pay | Admitting: Physician Assistant

## 2022-05-26 ENCOUNTER — Encounter (HOSPITAL_BASED_OUTPATIENT_CLINIC_OR_DEPARTMENT_OTHER): Payer: Self-pay

## 2022-05-26 ENCOUNTER — Ambulatory Visit (HOSPITAL_BASED_OUTPATIENT_CLINIC_OR_DEPARTMENT_OTHER): Payer: Medicare Other | Admitting: Physical Therapy

## 2022-05-26 ENCOUNTER — Ambulatory Visit (INDEPENDENT_AMBULATORY_CARE_PROVIDER_SITE_OTHER): Payer: Medicare Other | Admitting: Physician Assistant

## 2022-05-26 VITALS — BP 112/60 | HR 71 | Temp 97.3°F | Ht 60.0 in | Wt 130.0 lb

## 2022-05-26 DIAGNOSIS — E785 Hyperlipidemia, unspecified: Secondary | ICD-10-CM | POA: Diagnosis not present

## 2022-05-26 DIAGNOSIS — E039 Hypothyroidism, unspecified: Secondary | ICD-10-CM

## 2022-05-26 DIAGNOSIS — R2681 Unsteadiness on feet: Secondary | ICD-10-CM

## 2022-05-26 DIAGNOSIS — R252 Cramp and spasm: Secondary | ICD-10-CM

## 2022-05-26 DIAGNOSIS — R296 Repeated falls: Secondary | ICD-10-CM

## 2022-05-26 DIAGNOSIS — M546 Pain in thoracic spine: Secondary | ICD-10-CM

## 2022-05-26 DIAGNOSIS — M6281 Muscle weakness (generalized): Secondary | ICD-10-CM

## 2022-05-26 DIAGNOSIS — M5459 Other low back pain: Secondary | ICD-10-CM | POA: Diagnosis not present

## 2022-05-26 LAB — COMPREHENSIVE METABOLIC PANEL
ALT: 16 U/L (ref 0–35)
AST: 17 U/L (ref 0–37)
Albumin: 4 g/dL (ref 3.5–5.2)
Alkaline Phosphatase: 71 U/L (ref 39–117)
BUN: 11 mg/dL (ref 6–23)
CO2: 28 mEq/L (ref 19–32)
Calcium: 9.1 mg/dL (ref 8.4–10.5)
Chloride: 102 mEq/L (ref 96–112)
Creatinine, Ser: 0.75 mg/dL (ref 0.40–1.20)
GFR: 76.51 mL/min (ref >60.00)
Glucose, Bld: 84 mg/dL (ref 70–99)
Potassium: 4.4 mEq/L (ref 3.5–5.1)
Sodium: 137 mEq/L (ref 135–145)
Total Bilirubin: 0.6 mg/dL (ref 0.2–1.2)
Total Protein: 6.6 g/dL (ref 6.0–8.3)

## 2022-05-26 LAB — LIPID PANEL
Cholesterol: 179 mg/dL (ref 0–200)
HDL: 60.2 mg/dL (ref >39.00)
LDL Cholesterol: 88 mg/dL (ref 0–99)
NonHDL: 119
Total CHOL/HDL Ratio: 3
Triglycerides: 153 mg/dL — ABNORMAL HIGH (ref 0.0–149.0)
VLDL: 30.6 mg/dL (ref 0.0–40.0)

## 2022-05-26 LAB — TSH: TSH: 0.88 u[IU]/mL (ref 0.35–5.50)

## 2022-05-26 NOTE — Patient Instructions (Signed)
It was great to see you!  We will update blood work today and provide recommendations accordingly  Take care,  Inda Coke PA-C

## 2022-05-26 NOTE — Progress Notes (Signed)
Heather Hodges is a 77 y.o. female here for a follow up of a pre-existing problem.  History of Present Illness:   Chief Complaint  Patient presents with   Medication Refill      HLD Currently taking lipitor 80 mg  Tolerating well overall Denies concerns with this medication  Hypothyroidism Currently taking levothyroxine 88 mcg and 75 mcg daily on an alternating basis Tolerating well She is compliant with this medication and takes on a regular basis Denies concerns with fatigue, weight changes, skin   Past Medical History:  Diagnosis Date   Arthritis    Colon polyps    Diverticulosis    History of chicken pox    Hyperlipidemia    Hypothyroidism    Osteoporosis    Vitamin D deficiency      Social History   Tobacco Use   Smoking status: Never   Smokeless tobacco: Never  Substance Use Topics   Alcohol use: Never   Drug use: Never    Past Surgical History:  Procedure Laterality Date   BREAST BIOPSY Left    Benign   TONSILLECTOMY AND ADENOIDECTOMY      Family History  Problem Relation Age of Onset   Arthritis Mother    Breast cancer Mother        61s-70s   High Cholesterol Mother    Heart attack Father    High Cholesterol Father    Arthritis Brother    Hearing loss Brother    Hypercholesterolemia Brother     Allergies  Allergen Reactions   Risedronate Sodium Other (See Comments)    TMJ dysfunction    Current Medications:   Current Outpatient Medications:    acetaminophen (TYLENOL 8 HOUR ARTHRITIS PAIN) 650 MG CR tablet, Take 650 mg by mouth every 8 (eight) hours as needed for pain., Disp: , Rfl:    atorvastatin (LIPITOR) 80 MG tablet, Take 1 tablet (80 mg total) by mouth daily., Disp: 90 tablet, Rfl: 3   b complex vitamins capsule, Take 1 capsule by mouth every other day., Disp: , Rfl:    Calcium Carbonate-Vitamin D (CALCIUM 500+D PO), Take 2 each by mouth every other day., Disp: , Rfl:    DULoxetine 40 MG CPEP, Take 1 capsule (40 mg total) by  mouth daily., Disp: 90 capsule, Rfl: 1   gabapentin (NEURONTIN) 100 MG capsule, Take 2 capsules (200 mg total) by mouth 3 (three) times daily. (Patient taking differently: Take 200 mg by mouth at bedtime.), Disp: 540 capsule, Rfl: 1   ketoconazole (NIZORAL) 2 % cream, Apply to affected area 1-2 times daily, Disp: 15 g, Rfl: 0   levothyroxine (SYNTHROID) 75 MCG tablet, Take 1 tablet (75 mcg total) by mouth every other day., Disp: 90 tablet, Rfl: 1   levothyroxine (SYNTHROID) 88 MCG tablet, Take 1 tablet (88 mcg total) by mouth every other day., Disp: 90 tablet, Rfl: 1   lidocaine (XYLOCAINE) 4 % external solution, Apply topically., Disp: , Rfl:    Lidocaine 4 % PTCH, Place onto the skin., Disp: , Rfl:    meloxicam (MOBIC) 15 MG tablet, TAKE 1 TABLET DAILY, Disp: 90 tablet, Rfl: 1   methocarbamol (ROBAXIN) 500 MG tablet, Take 1 tablet (500 mg total) by mouth at bedtime as needed for muscle spasms., Disp: 90 tablet, Rfl: 1   Multiple Vitamins-Minerals (CENTRUM SILVER 50+WOMEN PO), Take 1 tablet by mouth daily in the afternoon., Disp: , Rfl:    Omega-3 Fatty Acids (OMEGA 3 500 PO), Take 1  capsule by mouth daily in the afternoon., Disp: , Rfl:    OVER THE COUNTER MEDICATION, Apply topically as needed. Hempvana, Disp: , Rfl:    SYSTANE ULTRA 0.4-0.3 % SOLN, SMARTSIG:1 Drop(s) In Eye(s) PRN, Disp: , Rfl:    TURMERIC PO, Take 2 each by mouth daily in the afternoon., Disp: , Rfl:    Review of Systems:   ROS Negative unless otherwise specified per HPI.  Vitals:   Vitals:   05/26/22 1020  BP: 112/60  Pulse: 71  Temp: (!) 97.3 F (36.3 C)  TempSrc: Temporal  SpO2: 95%  Weight: 130 lb (59 kg)  Height: 5' (1.524 m)     Body mass index is 25.39 kg/m.  Physical Exam:   Physical Exam Vitals and nursing note reviewed.  Constitutional:      General: She is not in acute distress.    Appearance: She is well-developed. She is not ill-appearing or toxic-appearing.  Cardiovascular:     Rate and  Rhythm: Normal rate and regular rhythm.     Pulses: Normal pulses.     Heart sounds: Normal heart sounds, S1 normal and S2 normal.  Pulmonary:     Effort: Pulmonary effort is normal.     Breath sounds: Normal breath sounds.  Skin:    General: Skin is warm and dry.  Neurological:     Mental Status: She is alert.     GCS: GCS eye subscore is 4. GCS verbal subscore is 5. GCS motor subscore is 6.  Psychiatric:        Speech: Speech normal.        Behavior: Behavior normal. Behavior is cooperative.     Assessment and Plan:   Acquired hypothyroidism Update TSH and adjust levothyroxine 88 mcg and 75 mcg daily on an alternating basis accordingly  Hyperlipidemia, unspecified hyperlipidemia type Update lipid panel and provide recommendations accordingly On max dose of lipitor, consider referral to lipid clinic if indicated   Inda Coke, PA-C

## 2022-05-26 NOTE — Therapy (Unsigned)
OUTPATIENT PHYSICAL THERAPY THORACOLUMBAR TREATMENT/progress note   Patient Name: Heather Hodges MRN: [id number] DOB:1945-12-29, 77 y.o., female Today's Date: 05/27/2022  END OF SESSION:  PT End of Session - 05/26/22 0914     Visit Number 10    Number of Visits 26    Date for PT Re-Evaluation 07/07/22    Authorization Type M[employer] and [employer]    PT Start Time 0800    PT Stop Time 0843    PT Time Calculation (min) 43 min    Activity Tolerance Patient tolerated treatment well    Behavior During Therapy [facility] for tasks assessed/performed            Progress Note Reporting Period 04/15/2022 to 05/26/2022  See note below for Objective Data and Assessment of Progress/Goals.        Past Medical History:  Diagnosis Date   Arthritis    Colon polyps    Diverticulosis    History of chicken pox    Hyperlipidemia    Hypothyroidism    Osteoporosis    Vitamin D deficiency    Past Surgical History:  Procedure Laterality Date   BREAST BIOPSY Left    Benign   TONSILLECTOMY AND ADENOIDECTOMY     Patient Active Problem List   Diagnosis Date Noted   Fibromyalgia 02/11/2021   Chronic neck pain 02/11/2021   Osteoporosis    Hypothyroidism    Hyperlipidemia    Diverticulosis    Arthritis    Chronic pain syndrome 02/03/2018   Contusion of left lung 07/20/2017   Cerebral contusion ([place]) 07/17/2017   Traumatic pneumothorax 07/17/2017   Rib fractures 07/16/2017   Vitamin D deficiency 08/06/2015    PCP: Inda Coke PA-C  REFERRING PROVIDER: Gregor Hams, MD  REFERRING DIAG: M54.50,G89.29 (ICD-10-CM) - Chronic bilateral low back pain without sciatica M54.6,G89.29 (ICD-10-CM) - Chronic bilateral thoracic back pain  Rationale for Evaluation and Treatment: Rehabilitation  THERAPY DIAG:  Other low back pain  Pain in thoracic spine  Muscle weakness (generalized)  Cramp and spasm  Unsteadiness on feet  Repeated falls  ONSET DATE: 04/03/2022  SUBJECTIVE:                                                                                                                                                                                            SUBJECTIVE STATEMENT:  The patient had an injection in her neck a few days ago.  She reports no significant difference at this time but is only few days ago.  Her chief complaint today is tightness in her right upper trap.  She reports no significant increase in pain  in her lower back following previous treatment.  PERTINENT HISTORY:  77 y.o. female with concussion complicated by acute exacerbation of chronic back pain.  She had a fall almost a month ago now and suffered a concussion as well as worsening of her back pain.   Her diffuse soreness and especially her thoracic and lumbar back pain are her chief problems now.  She is reluctant to consider [place] [facility] therapy as she feels that hurt her previously.  Her primary care provider suggested aquatic physical therapy and I think that is a great idea.  She is willing to consider it as well.  Plan to refer to aquatic physical therapy.   She is having trouble sleeping especially due to the body soreness and back soreness.  Will try low-dose methocarbamol at bedtime.  If that does not work we can try other muscle relaxers or other medications.   Recheck in 3 weeks.    PAIN:  Are you having pain? Yes: NPRS scale: 3/10 Pain location: Right upper trap area Pain description: ache  Aggravating factors: movement in general, little of everything  Relieving factors: tylenol OA but "depends on what I've been doing"   PRECAUTIONS: Other: fall, hx of compression fractures   WEIGHT BEARING RESTRICTIONS: No  FALLS:  Has patient fallen in last 6 months? Yes. Number of falls 1; (-) FOF  LIVING ENVIRONMENT: Lives with: lives with their family Lives in: Other townhome  Stairs: no steps inside or outside  Has following equipment at home: Single point cane  OCCUPATION:  retired   PLOF: Independent, Independent with basic ADLs, Independent with gait, and Independent with transfers  PATIENT GOALS: be able to travel again safely, get loosened up and feeling better   NEXT MD VISIT: Dr. Georgina Snell next week   OBJECTIVE:   DIAGNOSTIC FINDINGS:  FINDINGS: There is no evidence of thoracic spine fracture. Chronic compression deformity of thoracic vertebral bodies (upper and mid) are unchanged compared to prior exam. Mild anterior spurring is identified in the thoracic spine. Alignment is normal. No other significant bone abnormalities are identified.   IMPRESSION: No acute abnormality. Chronic compression deformity of thoracic vertebral bodies are unchanged compared to prior exam.  FINDINGS: There is no evidence of lumbar spine fracture. Grade 1 anterolisthesis of L4 on L5 is unchanged. Facet joint sclerosis is identified in the mid to lower lumbar spine.   IMPRESSION: No acute fracture or dislocation. Degenerative joint changes of lumbar spine which appears stable compared prior exam.  PATIENT SURVEYS:  FOTO will do 2nd session   SCREENING FOR RED FLAGS: Bowel or bladder incontinence: No Spinal tumors: No Cauda equina syndrome: No Compression fracture: No Abdominal aneurysm: No  COGNITION: Overall cognitive status: Within functional limits for tasks assessed     SENSATION: Not tested, but reports no hx of this   MUSCLE LENGTH:   POSTURE: rounded shoulders, forward head, decreased lumbar lordosis, increased thoracic kyphosis, and flexed trunk   PALPATION: Severe trigger points and mm spasms noted L rotator cuff, L latissimus, thoracic paraspinals- extremely sensitive   LUMBAR ROM:   AROM eval right  Flexion Mild limitation    Extension Severe limitation    Right lateral flexion Moderate limitation    Left lateral flexion Moderate limitation   Right rotation    Left rotation  75% limited wit pain    (Blank rows = not  tested)  Thoracic ROM: flexion WNL, extension severe limitation, lateral flexion mild limitation B, R rotation severe limitation, L moderate  limitation  LOWER EXTREMITY ROM:     MMT Right eval Left eval Right 2/26  Left  2/26   Hip flexion 3 3 8.9 13.1  Hip extension 2 at best (from bridge, unable to lift hips even with assist of HS) 2 at best (from bridge, unable to lift hips even with assist of HS)    Hip abduction 3 3+ 8.9 19.9  Hip adduction      Hip internal rotation      Hip external rotation      Knee flexion 3 3    Knee extension 4- 4- 16.1 17.6  Ankle dorsiflexion 4 4    Ankle plantarflexion      Ankle inversion      Ankle eversion       (Blank rows = not tested)  LOWER EXTREMITY MMT:    MMT Right eval Left eval  Hip flexion    Hip extension    Hip abduction    Hip adduction    Hip internal rotation    Hip external rotation    Knee flexion    Knee extension    Ankle dorsiflexion    Ankle plantarflexion    Ankle inversion    Ankle eversion     (Blank rows = not tested)  CERVICAL ROM:   Active ROM A/PROM (deg) eval  Flexion 45  Extension 15  Right lateral flexion   Left lateral flexion   Right rotation 45  Left rotation 55   (Blank rows = not tested)   LUMBAR SPECIAL TESTS:    FUNCTIONAL TESTS:  3 minute walk test: 579f no rest breaks no device  Dynamic Gait Index: 13/24  GAIT: Distance walked: 5268fAssistive device utilized: None Level of assistance: Complete Independence Comments: limited trunk rotation, trendelenburg, flexed at hips, limited ankle DF B   TODAY'S TREATMENT:                                                                                                                             2/26                                                                                                                             LTR x10  Supine march 2x10  Supine hip abduction 2x10   Reviewed previous HEPs and current HEP for technique  and use   Performed re-assessment on the patient. Reviewed test  and measures and goals including strength testing, FOTO, and ROM low   Last visit: Pt seen for aquatic therapy today.  Treatment took place in water 3.25-4 ft in depth at the [facility] pool. Temp of water was 91.  Pt entered/exited the pool via stairs with hand rail.   * without UE support: walking forward/backward * side stepping without/ with arm addct / abdct * short blue noodle push down with elbows straight/bent x 10 (like using a wash board), then pull downs with arms straight x 10 * wall push ups, cues for chin tuck for neutral x 10 * high knee marching backward/ forward with reciprocal arm swing * tandem gait forward/ backward (no support) * holding wall: Hip abdct/ addct x 10; hip ext x10 each  * kickboard push/pull in staggered stance x 10 each * gentle trunk rotation with hands resting flat on kick board * without support:  heel raises * return to walking forward/backward with reciprocal arm swing Pt requires the buoyancy and hydrostatic pressure of water for support, and to offload joints by unweighting joint load by at least 50 % in navel deep water and by at least 75-80% in chest to neck deep water.  Viscosity of the water is needed for resistance of strengthening. Water current perturbations provides challenge to standing balance requiring increased core activation.  2/21: Reviewed HEP and how to use it at home. Thera cane use and where to purchase. Upper trap stretch 2 x 20-second hold patient advised not to overdo Levator stretch 2 x 20-second hold bilateral patient again advised not to overstretch Row 2 x 10 yellow Shoulder extension 2 x 10 yellow   PATIENT EDUCATION:  Education details: aquatic progressions and modifications  Person educated: Patient and Spouse Education method: Explanation Education comprehension: verbalized understanding  HOME EXERCISE  PROGRAM: TBA  ASSESSMENT:  CLINICAL IMPRESSION:   The patient is making some progress with her lower back. Her strength was at a 3/5 with hip flexion on the initial eval. She can now put 8-10 pounds of pressure against the hand dyno. She does continues to have significant gluteal weakness on the right. We measured her neck range today to get a baseline. Her back extension has improved significantly since the first visit. She would benefit from further skilled therapy 2W6 to continue to develop a land program. Over the past episode she has developed a full pool program. She will not has access to a pool until summer. See below for goal specific progress.  Reviewed previous HEP is given from other physical therapy episodes in the past.  Many of the exercises overlaps in the exercises that were given to her.  She was advised to stay with current exercises but she can supplement exercises and if she feels like she needs more variety from previous episodes.  See below for goal specific progress.   OBJECTIVE IMPAIRMENTS: Abnormal gait, decreased activity tolerance, decreased balance, decreased mobility, difficulty walking, decreased ROM, decreased strength, decreased safety awareness, hypomobility, increased fascial restrictions, impaired flexibility, improper body mechanics, postural dysfunction, and pain.   ACTIVITY LIMITATIONS: standing, squatting, stairs, transfers, and locomotion level  PARTICIPATION LIMITATIONS: meal prep, cleaning, laundry, shopping, community activity, yard work, and church  PERSONAL FACTORS: Age, Behavior pattern, Fitness, Past/current experiences, and Time since onset of injury/illness/exacerbation are also affecting patient's functional outcome.   REHAB POTENTIAL: Good  CLINICAL DECISION MAKING: Evolving/moderate complexity  EVALUATION COMPLEXITY: Moderate   GOALS: Goals reviewed with patient? Yes  SHORT TERM GOALS: Target  date: 05/13/2022    Will be compliant  with appropriate progressive HEP  Baseline: Goal status: IN Has based exercise program 2/24  2.  Lumbar and thoracic ROM to be no more than 25% limited on all planes  Baseline:  Goal status:IN not assessed today 2/21 3.  Muscle spasms to have improved by at least 50% to assist in pain control  Baseline:  Goal status:  Has muscle spasms 2/21 4.  Pain to be no more than 5/10 at worst  Baseline:  Goal status:IN PROGRESS - pain varies; often dependent on weather - 05/14/22    LONG TERM GOALS: Target date: 06/10/2022    Will improve gross LE strength by 5 pounds in order to show improved functional strength  Baseline:  Goal status:  modified 2/26  2.  Gait mechanics to have improved with resolution of deficits in ankle DF during swing phase to reduce fall risk  Baseline:  Goal status: feels like it is somewhat improved 2/26  3.  Will score at least 18 on DGI to show reduced fall risk and improve ease of community access for safe travel  Baseline:  Goal status: will assess next visit   4.  Will be able to ambulate at least 651f on 3MWT to show improved mobility and ability to access community for safe travel  Baseline:  Goal status: will assess next visit   5.  Will be compliant with appropriate gym based exercise program to maintain functional gains and prevent falls/recurrence of severe pain  Baseline:  Goal status: progressing towards goal 2/27   PLAN:  PT FREQUENCY: 2x/week  PT DURATION: 6 weeks  PLANNED INTERVENTIONS: Therapeutic exercises, Therapeutic activity, Neuromuscular re-education, Balance training, Gait training, Patient/Family education, Self Care, Joint mobilization, Aquatic Therapy, Dry Needling, Electrical stimulation, Spinal mobilization, Cryotherapy, Moist heat, Taping, Ultrasound, Ionotophoresis '4mg'$ /ml Dexamethasone, Manual therapy, and Re-evaluation.  PLAN FOR NEXT SESSION: We will continue to progress land based program  DCarolyne LittlesPT DPT   05/27/22 10:36 AM [facility] [address] [address]Phone: [phone]  Fax:  [phone]

## 2022-05-27 ENCOUNTER — Telehealth: Payer: Self-pay | Admitting: Physician Assistant

## 2022-05-27 NOTE — Telephone Encounter (Signed)
  Encourage patient to contact the pharmacy for refills or they can request refills through [place]:  Please schedule appointment if longer than 1 year  NEXT APPOINTMENT DATE:  MEDICATION: 2 Thyroid meds  Is the patient out of medication? Almost  PHARMACY:  [place], [place] Phone: [phone]  Fax: [phone]      Let patient know to contact pharmacy at the end of the day to make sure medication is ready.  Please notify patient to allow 48-72 hours to process

## 2022-05-28 ENCOUNTER — Ambulatory Visit (HOSPITAL_BASED_OUTPATIENT_CLINIC_OR_DEPARTMENT_OTHER): Payer: Medicare Other | Admitting: Physical Therapy

## 2022-05-28 ENCOUNTER — Encounter (HOSPITAL_BASED_OUTPATIENT_CLINIC_OR_DEPARTMENT_OTHER): Payer: Self-pay | Admitting: Physical Therapy

## 2022-05-28 DIAGNOSIS — M546 Pain in thoracic spine: Secondary | ICD-10-CM

## 2022-05-28 DIAGNOSIS — M5459 Other low back pain: Secondary | ICD-10-CM

## 2022-05-28 DIAGNOSIS — R252 Cramp and spasm: Secondary | ICD-10-CM

## 2022-05-28 DIAGNOSIS — M6281 Muscle weakness (generalized): Secondary | ICD-10-CM

## 2022-05-28 MED ORDER — LEVOTHYROXINE SODIUM 75 MCG PO TABS: 75.0000 ug | ORAL_TABLET | ORAL | 1 refills | Status: DC

## 2022-05-28 MED ORDER — LEVOTHYROXINE SODIUM 88 MCG PO TABS: 88.0000 ug | ORAL_TABLET | ORAL | 1 refills | Status: DC

## 2022-05-28 NOTE — Telephone Encounter (Signed)
Left detailed message on personal voicemail Rx's for Synthroid were sent to your mail order pharmacy. Any questions please contact the office.

## 2022-05-28 NOTE — Therapy (Addendum)
OUTPATIENT PHYSICAL THERAPY THORACOLUMBAR TREATMENT/progress note   Patient Name: Heather Hodges MRN: [id number] DOB:[birth year], 77 y.o., female Today's Date: 05/28/2022  END OF SESSION:  PT End of Session - 05/28/22 0805     Visit Number 11    Number of Visits 26    Date for PT Re-Evaluation 07/07/22    Authorization Type M[employer] and [employer]    Authorization Time Period 04/15/22 to 06/24/22    PT Start Time 0802    PT Stop Time 0843    PT Time Calculation (min) 41 min    Activity Tolerance Patient tolerated treatment well    Behavior During Therapy [facility] for tasks assessed/performed            Progress Note Reporting Period 04/15/2022 to 05/26/2022  See note below for Objective Data and Assessment of Progress/Goals.        Past Medical History:  Diagnosis Date   Arthritis    Colon polyps    Diverticulosis    History of chicken pox    Hyperlipidemia    Hypothyroidism    Osteoporosis    Vitamin D deficiency    Past Surgical History:  Procedure Laterality Date   BREAST BIOPSY Left    Benign   TONSILLECTOMY AND ADENOIDECTOMY     Patient Active Problem List   Diagnosis Date Noted   Fibromyalgia 02/11/2021   Chronic neck pain 02/11/2021   Osteoporosis    Hypothyroidism    Hyperlipidemia    Diverticulosis    Arthritis    Chronic pain syndrome 02/03/2018   Contusion of left lung 07/20/2017   Cerebral contusion ([place]) 07/17/2017   Traumatic pneumothorax 07/17/2017   Rib fractures 07/16/2017   Vitamin D deficiency 08/06/2015    PCP: Inda Coke PA-C  REFERRING PROVIDER: Gregor Hams, MD  REFERRING DIAG: M54.50,G89.29 (ICD-10-CM) - Chronic bilateral low back pain without sciatica M54.6,G89.29 (ICD-10-CM) - Chronic bilateral thoracic back pain  Rationale for Evaluation and Treatment: Rehabilitation  THERAPY DIAG:  Other low back pain  Pain in thoracic spine  Muscle weakness (generalized)  Cramp and spasm  ONSET DATE: 04/03/2022  SUBJECTIVE:                                                                                                                                                                                            SUBJECTIVE STATEMENT:  The patient had an injection in her neck a few days ago.  She reports no significant difference at this time but is only few days ago.  Her chief complaint today is tightness in her right upper trap.  She reports no significant increase  in pain in her lower back following previous treatment.  PERTINENT HISTORY:  77 y.o. female with concussion complicated by acute exacerbation of chronic back pain.  She had a fall almost a month ago now and suffered a concussion as well as worsening of her back pain.   Her diffuse soreness and especially her thoracic and lumbar back pain are her chief problems now.  She is reluctant to consider [place] [facility] therapy as she feels that hurt her previously.  Her primary care provider suggested aquatic physical therapy and I think that is a great idea.  She is willing to consider it as well.  Plan to refer to aquatic physical therapy.   She is having trouble sleeping especially due to the body soreness and back soreness.  Will try low-dose methocarbamol at bedtime.  If that does not work we can try other muscle relaxers or other medications.   Recheck in 3 weeks.    PAIN:  Are you having pain? Yes: NPRS scale: 3/10 Pain location: Right upper trap area Pain description: ache  Aggravating factors: movement in general, little of everything  Relieving factors: tylenol OA but "depends on what I've been doing"   PRECAUTIONS: Other: fall, hx of compression fractures   WEIGHT BEARING RESTRICTIONS: No  FALLS:  Has patient fallen in last 6 months? Yes. Number of falls 1; (-) FOF  LIVING ENVIRONMENT: Lives with: lives with their family Lives in: Other townhome  Stairs: no steps inside or outside  Has following equipment at home: Single point  cane  OCCUPATION: retired   PLOF: Independent, Independent with basic ADLs, Independent with gait, and Independent with transfers  PATIENT GOALS: be able to travel again safely, get loosened up and feeling better   NEXT MD VISIT: Dr. Georgina Snell next week   OBJECTIVE:   DIAGNOSTIC FINDINGS:  FINDINGS: There is no evidence of thoracic spine fracture. Chronic compression deformity of thoracic vertebral bodies (upper and mid) are unchanged compared to prior exam. Mild anterior spurring is identified in the thoracic spine. Alignment is normal. No other significant bone abnormalities are identified.   IMPRESSION: No acute abnormality. Chronic compression deformity of thoracic vertebral bodies are unchanged compared to prior exam.  FINDINGS: There is no evidence of lumbar spine fracture. Grade 1 anterolisthesis of L4 on L5 is unchanged. Facet joint sclerosis is identified in the mid to lower lumbar spine.   IMPRESSION: No acute fracture or dislocation. Degenerative joint changes of lumbar spine which appears stable compared prior exam.  PATIENT SURVEYS:  FOTO will do 2nd session   SCREENING FOR RED FLAGS: Bowel or bladder incontinence: No Spinal tumors: No Cauda equina syndrome: No Compression fracture: No Abdominal aneurysm: No  COGNITION: Overall cognitive status: Within functional limits for tasks assessed     SENSATION: Not tested, but reports no hx of this   MUSCLE LENGTH:   POSTURE: rounded shoulders, forward head, decreased lumbar lordosis, increased thoracic kyphosis, and flexed trunk   PALPATION: Severe trigger points and mm spasms noted L rotator cuff, L latissimus, thoracic paraspinals- extremely sensitive   LUMBAR ROM:   AROM eval right  Flexion Mild limitation    Extension Severe limitation    Right lateral flexion Moderate limitation    Left lateral flexion Moderate limitation   Right rotation    Left rotation  75% limited wit pain    (Blank rows =  not tested)  Thoracic ROM: flexion WNL, extension severe limitation, lateral flexion mild limitation B, R rotation severe limitation,  L moderate limitation  LOWER EXTREMITY ROM:     MMT Right eval Left eval Right 2/26  Left  2/26   Hip flexion 3 3 8.9 13.1  Hip extension 2 at best (from bridge, unable to lift hips even with assist of HS) 2 at best (from bridge, unable to lift hips even with assist of HS)    Hip abduction 3 3+ 8.9 19.9  Hip adduction      Hip internal rotation      Hip external rotation      Knee flexion 3 3    Knee extension 4- 4- 16.1 17.6  Ankle dorsiflexion 4 4    Ankle plantarflexion      Ankle inversion      Ankle eversion       (Blank rows = not tested)  LOWER EXTREMITY MMT:    MMT Right eval Left eval  Hip flexion    Hip extension    Hip abduction    Hip adduction    Hip internal rotation    Hip external rotation    Knee flexion    Knee extension    Ankle dorsiflexion    Ankle plantarflexion    Ankle inversion    Ankle eversion     (Blank rows = not tested)  CERVICAL ROM:   Active ROM A/PROM (deg) eval  Flexion 45  Extension 15  Right lateral flexion   Left lateral flexion   Right rotation 45  Left rotation 55   (Blank rows = not tested)   LUMBAR SPECIAL TESTS:    FUNCTIONAL TESTS:  3 minute walk test: 592f no rest breaks no device  Dynamic Gait Index: 13/24  GAIT: Distance walked: 5262fAssistive device utilized: None Level of assistance: Complete Independence Comments: limited trunk rotation, trendelenburg, flexed at hips, limited ankle DF B   TODAY'S TREATMENT:                                                                                                                              2/28 Ball roll out 5x fwd and 5x side to side   Reviewed technique with PPT. Patient is doing well x10   Supine band Abduction 2x10 red   Standing hip 3 way with mod cuing for technique x10 each    Reviewed HEP and how to use at  home     2/26  LTR x10  Supine march 2x10  Supine hip abduction 2x10   Reviewed previous HEPs and current HEP for technique and use   Performed re-assessment on the patient. Reviewed test and measures and goals including strength testing, FOTO, and ROM low   Last visit: Pt seen for aquatic therapy today.  Treatment took place in water 3.25-4 ft in depth at the [facility] pool. Temp of water was 91.  Pt entered/exited the pool via stairs with hand rail.   * without UE support: walking forward/backward * side stepping without/ with arm addct / abdct * short blue noodle push down with elbows straight/bent x 10 (like using a wash board), then pull downs with arms straight x 10 * wall push ups, cues for chin tuck for neutral x 10 * high knee marching backward/ forward with reciprocal arm swing * tandem gait forward/ backward (no support) * holding wall: Hip abdct/ addct x 10; hip ext x10 each  * kickboard push/pull in staggered stance x 10 each * gentle trunk rotation with hands resting flat on kick board * without support:  heel raises * return to walking forward/backward with reciprocal arm swing Pt requires the buoyancy and hydrostatic pressure of water for support, and to offload joints by unweighting joint load by at least 50 % in navel deep water and by at least 75-80% in chest to neck deep water.  Viscosity of the water is needed for resistance of strengthening. Water current perturbations provides challenge to standing balance requiring increased core activation.    PATIENT EDUCATION:  Education details: aquatic progressions and modifications  Person educated: Patient and Spouse Education method: Explanation Education comprehension: verbalized understanding  HOME EXERCISE PROGRAM: Access Code: [email or web address] URL:  [email or web address] Date: 05/28/2022 Prepared by: Carolyne Littles  ASSESSMENT:  CLINICAL IMPRESSION:   The patient continues to tolerate treatment well. We added a standing series today and another stretch for her back. She tolerated well. We updated her HEP. We reviewed how to use her program. She demonstrated a good understanding. We will continue to work towards an HEP she feels comfortable with.   OBJECTIVE IMPAIRMENTS: Abnormal gait, decreased activity tolerance, decreased balance, decreased mobility, difficulty walking, decreased ROM, decreased strength, decreased safety awareness, hypomobility, increased fascial restrictions, impaired flexibility, improper body mechanics, postural dysfunction, and pain.   ACTIVITY LIMITATIONS: standing, squatting, stairs, transfers, and locomotion level  PARTICIPATION LIMITATIONS: meal prep, cleaning, laundry, shopping, community activity, yard work, and church  PERSONAL FACTORS: Age, Behavior pattern, Fitness, Past/current experiences, and Time since onset of injury/illness/exacerbation are also affecting patient's functional outcome.   REHAB POTENTIAL: Good  CLINICAL DECISION MAKING: Evolving/moderate complexity  EVALUATION COMPLEXITY: Moderate   GOALS: Goals reviewed with patient? Yes  SHORT TERM GOALS: Target date: 05/13/2022    Will be compliant with appropriate progressive HEP  Baseline: Goal status: IN Has based exercise program 2/24  2.  Lumbar and thoracic ROM to be no more than 25% limited on all planes  Baseline:  Goal status:IN not assessed today 2/21 3.  Muscle spasms to have improved by at least 50% to assist in pain control  Baseline:  Goal status:  Has muscle spasms 2/21 4.  Pain to be no more than 5/10 at worst  Baseline:  Goal status:IN PROGRESS - pain varies; often dependent on weather - 05/14/22    LONG TERM GOALS: Target date: 06/10/2022    Will improve gross LE strength by 5 pounds in order  to show improved functional strength  Baseline:  Goal status:  modified 2/26  2.  Gait mechanics to have improved with resolution of deficits in ankle DF during swing phase to reduce fall risk  Baseline:  Goal status: feels like it is somewhat improved 2/26  3.  Will score at least 18 on DGI to show reduced fall risk and improve ease of community access for safe travel  Baseline:  Goal status: will assess next visit   4.  Will be able to ambulate at least 663f on 3MWT to show improved mobility and ability to access community for safe travel  Baseline:  Goal status: will assess next visit   5.  Will be compliant with appropriate gym based exercise program to maintain functional gains and prevent falls/recurrence of severe pain  Baseline:  Goal status: progressing towards goal 2/27   PLAN:  PT FREQUENCY: 2x/week  PT DURATION: 6 weeks  PLANNED INTERVENTIONS: Therapeutic exercises, Therapeutic activity, Neuromuscular re-education, Balance training, Gait training, Patient/Family education, Self Care, Joint mobilization, Aquatic Therapy, Dry Needling, Electrical stimulation, Spinal mobilization, Cryotherapy, Moist heat, Taping, Ultrasound, Ionotophoresis '4mg'$ /ml Dexamethasone, Manual therapy, and Re-evaluation.  PLAN FOR NEXT SESSION: We will continue to progress land based program  DCarolyne LittlesPT DPT  05/28/22 10:12 AM [facility] [address] AvenueGPlaya Fortuna N[place] [address]Phone: [phone]  Fax:  [phone]

## 2022-06-02 ENCOUNTER — Ambulatory Visit (HOSPITAL_BASED_OUTPATIENT_CLINIC_OR_DEPARTMENT_OTHER): Payer: Medicare Other | Attending: Family Medicine | Admitting: Physical Therapy

## 2022-06-02 ENCOUNTER — Encounter (HOSPITAL_BASED_OUTPATIENT_CLINIC_OR_DEPARTMENT_OTHER): Payer: Self-pay | Admitting: Physical Therapy

## 2022-06-02 DIAGNOSIS — M6281 Muscle weakness (generalized): Secondary | ICD-10-CM | POA: Diagnosis present

## 2022-06-02 DIAGNOSIS — M546 Pain in thoracic spine: Secondary | ICD-10-CM | POA: Insufficient documentation

## 2022-06-02 DIAGNOSIS — M5459 Other low back pain: Secondary | ICD-10-CM | POA: Insufficient documentation

## 2022-06-02 DIAGNOSIS — R296 Repeated falls: Secondary | ICD-10-CM | POA: Insufficient documentation

## 2022-06-02 DIAGNOSIS — R2681 Unsteadiness on feet: Secondary | ICD-10-CM | POA: Diagnosis present

## 2022-06-02 DIAGNOSIS — R252 Cramp and spasm: Secondary | ICD-10-CM | POA: Insufficient documentation

## 2022-06-02 NOTE — Therapy (Signed)
OUTPATIENT PHYSICAL THERAPY THORACOLUMBAR TREATMENT/progress note   Patient Name: Heather Hodges MRN: [id number] DOB:Jun 14, 1945, 77 y.o., female Today's Date: 06/02/2022  END OF SESSION:  PT End of Session - 06/02/22 0815     Visit Number 12    Number of Visits 26    Date for PT Re-Evaluation 07/07/22    Authorization Type MCR and [employer]    PT Start Time 0800    PT Stop Time 0844    PT Time Calculation (min) 44 min    Activity Tolerance Patient tolerated treatment well    Behavior During Therapy [name] for tasks assessed/performed            Progress Note Reporting Period 04/15/2022 to 05/26/2022  See note below for Objective Data and Assessment of Progress/Goals.        Past Medical History:  Diagnosis Date   Arthritis    Colon polyps    Diverticulosis    History of chicken pox    Hyperlipidemia    Hypothyroidism    Osteoporosis    Vitamin D deficiency    Past Surgical History:  Procedure Laterality Date   BREAST BIOPSY Left    Benign   TONSILLECTOMY AND ADENOIDECTOMY     Patient Active Problem List   Diagnosis Date Noted   Fibromyalgia 02/11/2021   Chronic neck pain 02/11/2021   Osteoporosis    Hypothyroidism    Hyperlipidemia    Diverticulosis    Arthritis    Chronic pain syndrome 02/03/2018   Contusion of left lung 07/20/2017   Cerebral contusion ([place]) 07/17/2017   Traumatic pneumothorax 07/17/2017   Rib fractures 07/16/2017   Vitamin D deficiency 08/06/2015    PCP: Inda Coke PA-C  REFERRING PROVIDER: Gregor Hams, MD  REFERRING DIAG: M54.50,G89.29 (ICD-10-CM) - Chronic bilateral low back pain without sciatica M54.6,G89.29 (ICD-10-CM) - Chronic bilateral thoracic back pain  Rationale for Evaluation and Treatment: Rehabilitation  THERAPY DIAG:  Other low back pain  Pain in thoracic spine  Muscle weakness (generalized)  Cramp and spasm  Unsteadiness on feet  Repeated falls  ONSET DATE: 04/03/2022  SUBJECTIVE:                                                                                                                                                                                            SUBJECTIVE STATEMENT: The patient reports she has been doing pretty well. She has some tightness in her neck but she reports that is baseline. She has been working on her exercises> She reports is feeling pretty good today  PERTINENT HISTORY:  77 y.o. female with concussion complicated by acute  exacerbation of chronic back pain.  She had a fall almost a month ago now and suffered a concussion as well as worsening of her back pain.   Her diffuse soreness and especially her thoracic and lumbar back pain are her chief problems now.  She is reluctant to consider [facility] as she feels that hurt her previously.  Her primary care provider suggested aquatic physical therapy and I think that is a great idea.  She is willing to consider it as well.  Plan to refer to aquatic physical therapy.   She is having trouble sleeping especially due to the body soreness and back soreness.  Will try low-dose methocarbamol at bedtime.  If that does not work we can try other muscle relaxers or other medications.   Recheck in 3 weeks.    PAIN:  Are you having pain? Yes: NPRS scale: 3/10 Pain location: Right upper trap area Pain description: ache  Aggravating factors: movement in general, little of everything  Relieving factors: tylenol OA but "depends on what I've been doing"   PRECAUTIONS: Other: fall, hx of compression fractures   WEIGHT BEARING RESTRICTIONS: No  FALLS:  Has patient fallen in last 6 months? Yes. Number of falls 1; (-) FOF  LIVING ENVIRONMENT: Lives with: lives with their family Lives in: Other townhome  Stairs: no steps inside or outside  Has following equipment at home: Single point cane  OCCUPATION: retired   PLOF: Independent, Independent with basic ADLs, Independent with gait, and Independent  with transfers  PATIENT GOALS: be able to travel again safely, get loosened up and feeling better   NEXT MD VISIT: Dr. Georgina Snell next week   OBJECTIVE:   DIAGNOSTIC FINDINGS:  FINDINGS: There is no evidence of thoracic spine fracture. Chronic compression deformity of thoracic vertebral bodies (upper and mid) are unchanged compared to prior exam. Mild anterior spurring is identified in the thoracic spine. Alignment is normal. No other significant bone abnormalities are identified.   IMPRESSION: No acute abnormality. Chronic compression deformity of thoracic vertebral bodies are unchanged compared to prior exam.  FINDINGS: There is no evidence of lumbar spine fracture. Grade 1 anterolisthesis of L4 on L5 is unchanged. Facet joint sclerosis is identified in the mid to lower lumbar spine.   IMPRESSION: No acute fracture or dislocation. Degenerative joint changes of lumbar spine which appears stable compared prior exam.  PATIENT SURVEYS:  FOTO will do 2nd session   SCREENING FOR RED FLAGS: Bowel or bladder incontinence: No Spinal tumors: No Cauda equina syndrome: No Compression fracture: No Abdominal aneurysm: No  COGNITION: Overall cognitive status: Within functional limits for tasks assessed     SENSATION: Not tested, but reports no hx of this   MUSCLE LENGTH:   POSTURE: rounded shoulders, forward head, decreased lumbar lordosis, increased thoracic kyphosis, and flexed trunk   PALPATION: Severe trigger points and mm spasms noted L rotator cuff, L latissimus, thoracic paraspinals- extremely sensitive   LUMBAR ROM:   AROM eval right  Flexion Mild limitation    Extension Severe limitation    Right lateral flexion Moderate limitation    Left lateral flexion Moderate limitation   Right rotation    Left rotation  75% limited wit pain    (Blank rows = not tested)  Thoracic ROM: flexion WNL, extension severe limitation, lateral flexion mild limitation B, R rotation  severe limitation, L moderate limitation  LOWER EXTREMITY ROM:     MMT Right eval Left eval Right 2/26  Left  2/26   Hip flexion 3 3 8.9 13.1  Hip extension 2 at best (from bridge, unable to lift hips even with assist of HS) 2 at best (from bridge, unable to lift hips even with assist of HS)    Hip abduction 3 3+ 8.9 19.9  Hip adduction      Hip internal rotation      Hip external rotation      Knee flexion 3 3    Knee extension 4- 4- 16.1 17.6  Ankle dorsiflexion 4 4    Ankle plantarflexion      Ankle inversion      Ankle eversion       (Blank rows = not tested)  LOWER EXTREMITY MMT:    MMT Right eval Left eval  Hip flexion    Hip extension    Hip abduction    Hip adduction    Hip internal rotation    Hip external rotation    Knee flexion    Knee extension    Ankle dorsiflexion    Ankle plantarflexion    Ankle inversion    Ankle eversion     (Blank rows = not tested)  CERVICAL ROM:   Active ROM A/PROM (deg) eval  Flexion 45  Extension 15  Right lateral flexion   Left lateral flexion   Right rotation 45  Left rotation 55   (Blank rows = not tested)   LUMBAR SPECIAL TESTS:    FUNCTIONAL TESTS:  3 minute walk test: 511f no rest breaks no device  Dynamic Gait Index: 13/24  GAIT: Distance walked: 5273fAssistive device utilized: None Level of assistance: Complete Independence Comments: limited trunk rotation, trendelenburg, flexed at hips, limited ankle DF B   TODAY'S TREATMENT:                                                                                                                              3/4 Ball roll out 5x fwd and 5x side to side   Seated Band hip abduction 2x10  Seated march with band 2x10 yellow Seated LAQ 2x10   Bilateral ER 2x10 yellow  Bilateral horizontal abduction 2x10 yellow   Reviewed total HEP  2/28 Ball roll out 5x fwd and 5x side to side   Reviewed technique with PPT. Patient is doing well x10   Supine band  Abduction 2x10 red   Standing hip 3 way with mod cuing for technique x10 each    Reviewed HEP and how to use at home     2/26  PATIENT EDUCATION:  Education details: aquatic progressions and modifications  Person educated: Patient and Spouse Education method: Explanation Education comprehension: verbalized understanding  HOME EXERCISE PROGRAM: Access Code: [email or web address] URL: [email or web address] Date: 05/28/2022 Prepared by: Carolyne Littles  ASSESSMENT:  CLINICAL IMPRESSION: The patient continues to make progress. We reviewed a sitting series of exercises today. She tolerated well. We reviewed her complete HEP. She has baseline shoulder issues. We advised her that some of these should help, but if they hurt dont do the. We will review her complete HEP next visit. She is doing very well overall.   OBJECTIVE IMPAIRMENTS: Abnormal gait, decreased activity tolerance, decreased balance, decreased mobility, difficulty walking, decreased ROM, decreased strength, decreased safety awareness, hypomobility, increased fascial restrictions, impaired flexibility, improper body mechanics, postural dysfunction, and pain.   ACTIVITY LIMITATIONS: standing, squatting, stairs, transfers, and locomotion level  PARTICIPATION LIMITATIONS: meal prep, cleaning, laundry, shopping, community activity, yard work, and church  PERSONAL FACTORS: Age, Behavior pattern, Fitness, Past/current experiences, and Time since onset of injury/illness/exacerbation are also affecting patient's functional outcome.   REHAB POTENTIAL: Good  CLINICAL DECISION MAKING: Evolving/moderate complexity  EVALUATION COMPLEXITY: Moderate   GOALS: Goals reviewed with patient? Yes  SHORT TERM GOALS: Target date: 05/13/2022    Will be compliant with appropriate progressive HEP  Baseline: Goal  status: IN Has based exercise program 2/24  2.  Lumbar and thoracic ROM to be no more than 25% limited on all planes  Baseline:  Goal status:IN not assessed today 2/21 3.  Muscle spasms to have improved by at least 50% to assist in pain control  Baseline:  Goal status:  Has muscle spasms 2/21 4.  Pain to be no more than 5/10 at worst  Baseline:  Goal status:IN PROGRESS - pain varies; often dependent on weather - 05/14/22    LONG TERM GOALS: Target date: 06/10/2022    Will improve gross LE strength by 5 pounds in order to show improved functional strength  Baseline:  Goal status:  modified 2/26  2.  Gait mechanics to have improved with resolution of deficits in ankle DF during swing phase to reduce fall risk  Baseline:  Goal status: feels like it is somewhat improved 2/26  3.  Will score at least 18 on DGI to show reduced fall risk and improve ease of community access for safe travel  Baseline:  Goal status: will assess next visit   4.  Will be able to ambulate at least 682f on 3MWT to show improved mobility and ability to access community for safe travel  Baseline:  Goal status: will assess next visit   5.  Will be compliant with appropriate gym based exercise program to maintain functional gains and prevent falls/recurrence of severe pain  Baseline:  Goal status: progressing towards goal 2/27   PLAN:  PT FREQUENCY: 2x/week  PT DURATION: 6 weeks  PLANNED INTERVENTIONS: Therapeutic exercises, Therapeutic activity, Neuromuscular re-education, Balance training, Gait training, Patient/Family education, Self Care, Joint mobilization, Aquatic Therapy, Dry Needling, Electrical stimulation, Spinal mobilization, Cryotherapy, Moist heat, Taping, Ultrasound, Ionotophoresis '4mg'$ /ml Dexamethasone, Manual therapy, and Re-evaluation.  PLAN FOR NEXT SESSION: We will continue to progress land based program  DCarolyne LittlesPT DPT  06/02/22 10:12 AM [facility] [address] [address]Phone: [phone]  Fax:  [phone]

## 2022-06-04 ENCOUNTER — Encounter (HOSPITAL_BASED_OUTPATIENT_CLINIC_OR_DEPARTMENT_OTHER): Payer: Self-pay | Admitting: Physical Therapy

## 2022-06-04 ENCOUNTER — Ambulatory Visit (HOSPITAL_BASED_OUTPATIENT_CLINIC_OR_DEPARTMENT_OTHER): Payer: Medicare Other | Admitting: Physical Therapy

## 2022-06-04 DIAGNOSIS — R2681 Unsteadiness on feet: Secondary | ICD-10-CM

## 2022-06-04 DIAGNOSIS — R296 Repeated falls: Secondary | ICD-10-CM

## 2022-06-04 DIAGNOSIS — M5459 Other low back pain: Secondary | ICD-10-CM | POA: Diagnosis not present

## 2022-06-04 DIAGNOSIS — R252 Cramp and spasm: Secondary | ICD-10-CM

## 2022-06-04 DIAGNOSIS — M546 Pain in thoracic spine: Secondary | ICD-10-CM

## 2022-06-04 DIAGNOSIS — M6281 Muscle weakness (generalized): Secondary | ICD-10-CM

## 2022-06-04 NOTE — Therapy (Signed)
OUTPATIENT PHYSICAL THERAPY THORACOLUMBAR TREATMENT/progress note   Patient Name: Heather Hodges MRN: [id number] DOB:February 14, 1946, 77 y.o., female Today's Date: 06/04/2022  END OF SESSION:  PT End of Session - 06/04/22 0804     Visit Number 13    Number of Visits 26    Date for PT Re-Evaluation 07/07/22    Authorization Type MCR and [employer]    Authorization Time Period 04/15/22 to 06/24/22    PT Start Time 0800    PT Stop Time 0840    PT Time Calculation (min) 40 min    Activity Tolerance Patient tolerated treatment well    Behavior During Therapy [facility] for tasks assessed/performed            Progress Note Reporting Period 04/15/2022 to 05/26/2022  See note below for Objective Data and Assessment of Progress/Goals.        Past Medical History:  Diagnosis Date   Arthritis    Colon polyps    Diverticulosis    History of chicken pox    Hyperlipidemia    Hypothyroidism    Osteoporosis    Vitamin D deficiency    Past Surgical History:  Procedure Laterality Date   BREAST BIOPSY Left    Benign   TONSILLECTOMY AND ADENOIDECTOMY     Patient Active Problem List   Diagnosis Date Noted   Fibromyalgia 02/11/2021   Chronic neck pain 02/11/2021   Osteoporosis    Hypothyroidism    Hyperlipidemia    Diverticulosis    Arthritis    Chronic pain syndrome 02/03/2018   Contusion of left lung 07/20/2017   Cerebral contusion ([place]) 07/17/2017   Traumatic pneumothorax 07/17/2017   Rib fractures 07/16/2017   Vitamin D deficiency 08/06/2015    PCP: Inda Coke PA-C  REFERRING PROVIDER: Gregor Hams, MD  REFERRING DIAG: M54.50,G89.29 (ICD-10-CM) - Chronic bilateral low back pain without sciatica M54.6,G89.29 (ICD-10-CM) - Chronic bilateral thoracic back pain  Rationale for Evaluation and Treatment: Rehabilitation  THERAPY DIAG:  Other low back pain  Pain in thoracic spine  Muscle weakness (generalized)  Cramp and spasm  Unsteadiness on feet  Repeated  falls  ONSET DATE: 04/03/2022  SUBJECTIVE:                                                                                                                                                                                           SUBJECTIVE STATEMENT: The patient reports she has been doing pretty well. She has some tightness in her neck but she reports that is baseline. She has been working on her exercises> She reports is feeling pretty good today  PERTINENT HISTORY:  77 y.o. female with concussion complicated by acute exacerbation of chronic back pain.  She had a fall almost a month ago now and suffered a concussion as well as worsening of her back pain.   Her diffuse soreness and especially her thoracic and lumbar back pain are her chief problems now.  She is reluctant to consider [facility] as she feels that hurt her previously.  Her primary care provider suggested aquatic physical therapy and I think that is a great idea.  She is willing to consider it as well.  Plan to refer to aquatic physical therapy.   She is having trouble sleeping especially due to the body soreness and back soreness.  Will try low-dose methocarbamol at bedtime.  If that does not work we can try other muscle relaxers or other medications.   Recheck in 3 weeks.    PAIN:  Are you having pain? Yes: NPRS scale: 3/10 Pain location: Right upper trap area Pain description: ache  Aggravating factors: movement in general, little of everything  Relieving factors: tylenol OA but "depends on what I've been doing"   PRECAUTIONS: Other: fall, hx of compression fractures   WEIGHT BEARING RESTRICTIONS: No  FALLS:  Has patient fallen in last 6 months? Yes. Number of falls 1; (-) FOF  LIVING ENVIRONMENT: Lives with: lives with their family Lives in: Other townhome  Stairs: no steps inside or outside  Has following equipment at home: Single point cane  OCCUPATION: retired   PLOF: Independent, Independent  with basic ADLs, Independent with gait, and Independent with transfers  PATIENT GOALS: be able to travel again safely, get loosened up and feeling better   NEXT MD VISIT: Dr. Georgina Snell next week   OBJECTIVE:   DIAGNOSTIC FINDINGS:  FINDINGS: There is no evidence of thoracic spine fracture. Chronic compression deformity of thoracic vertebral bodies (upper and mid) are unchanged compared to prior exam. Mild anterior spurring is identified in the thoracic spine. Alignment is normal. No other significant bone abnormalities are identified.   IMPRESSION: No acute abnormality. Chronic compression deformity of thoracic vertebral bodies are unchanged compared to prior exam.  FINDINGS: There is no evidence of lumbar spine fracture. Grade 1 anterolisthesis of L4 on L5 is unchanged. Facet joint sclerosis is identified in the mid to lower lumbar spine.   IMPRESSION: No acute fracture or dislocation. Degenerative joint changes of lumbar spine which appears stable compared prior exam.  PATIENT SURVEYS:  FOTO will do 2nd session   SCREENING FOR RED FLAGS: Bowel or bladder incontinence: No Spinal tumors: No Cauda equina syndrome: No Compression fracture: No Abdominal aneurysm: No  COGNITION: Overall cognitive status: Within functional limits for tasks assessed     SENSATION: Not tested, but reports no hx of this   MUSCLE LENGTH:   POSTURE: rounded shoulders, forward head, decreased lumbar lordosis, increased thoracic kyphosis, and flexed trunk   PALPATION: Severe trigger points and mm spasms noted L rotator cuff, L latissimus, thoracic paraspinals- extremely sensitive   LUMBAR ROM:   AROM eval right  Flexion Mild limitation    Extension Severe limitation    Right lateral flexion Moderate limitation    Left lateral flexion Moderate limitation   Right rotation    Left rotation  75% limited wit pain    (Blank rows = not tested)  Thoracic ROM: flexion WNL, extension severe  limitation, lateral flexion mild limitation B, R rotation severe limitation, L moderate limitation  LOWER EXTREMITY ROM:     MMT Right  eval Left eval Right 2/26  Left  2/26   Hip flexion 3 3 8.9 13.1  Hip extension 2 at best (from bridge, unable to lift hips even with assist of HS) 2 at best (from bridge, unable to lift hips even with assist of HS)    Hip abduction 3 3+ 8.9 19.9  Hip adduction      Hip internal rotation      Hip external rotation      Knee flexion 3 3    Knee extension 4- 4- 16.1 17.6  Ankle dorsiflexion 4 4    Ankle plantarflexion      Ankle inversion      Ankle eversion       (Blank rows = not tested)  LOWER EXTREMITY MMT:    MMT Right eval Left eval  Hip flexion    Hip extension    Hip abduction    Hip adduction    Hip internal rotation    Hip external rotation    Knee flexion    Knee extension    Ankle dorsiflexion    Ankle plantarflexion    Ankle inversion    Ankle eversion     (Blank rows = not tested)  CERVICAL ROM:   Active ROM A/PROM (deg) eval  Flexion 45  Extension 15  Right lateral flexion   Left lateral flexion   Right rotation 45  Left rotation 55   (Blank rows = not tested)   LUMBAR SPECIAL TESTS:    FUNCTIONAL TESTS:  3 minute walk test: 570f no rest breaks no device  Dynamic Gait Index: 13/24  GAIT: Distance walked: 5289fAssistive device utilized: None Level of assistance: Complete Independence Comments: limited trunk rotation, trendelenburg, flexed at hips, limited ankle DF B   TODAY'S TREATMENT:                                                                                                                              3/6 Seated Band hip abduction 2x10  Seated march with band 2x10 yellow Seated LAQ 2x10   3/4 Ball roll out 5x fwd and 5x side to side   Seated Band hip abduction 2x10  Seated march with band 2x10 yellow Seated LAQ 2x10   Bilateral ER 2x10 yellow  Bilateral horizontal abduction 2x10  yellow   Reviewed total HEP  2/28 Ball roll out 5x fwd and 5x side to side   Reviewed technique with PPT. Patient is doing well x10   Supine band Abduction 2x10 red   Standing hip 3 way with mod cuing for technique x10 each    Reviewed HEP and how to use at home     2/26  PATIENT EDUCATION:  Education details: aquatic progressions and modifications  Person educated: Patient and Spouse Education method: Explanation Education comprehension: verbalized understanding  HOME EXERCISE PROGRAM: Access Code: [email or web address] URL: [email or web address] Date: 05/28/2022 Prepared by: Carolyne Littles  ASSESSMENT:  CLINICAL IMPRESSION: The patient continues to make excellent progress. She has minor aches and pains at times but overall she is making progress. We reviewed current exercises that she already has on her program. She required only minimal cuing. We will review her final HEP over the next few visits.    OBJECTIVE IMPAIRMENTS: Abnormal gait, decreased activity tolerance, decreased balance, decreased mobility, difficulty walking, decreased ROM, decreased strength, decreased safety awareness, hypomobility, increased fascial restrictions, impaired flexibility, improper body mechanics, postural dysfunction, and pain.   ACTIVITY LIMITATIONS: standing, squatting, stairs, transfers, and locomotion level  PARTICIPATION LIMITATIONS: meal prep, cleaning, laundry, shopping, community activity, yard work, and church  PERSONAL FACTORS: Age, Behavior pattern, Fitness, Past/current experiences, and Time since onset of injury/illness/exacerbation are also affecting patient's functional outcome.   REHAB POTENTIAL: Good  CLINICAL DECISION MAKING: Evolving/moderate complexity  EVALUATION COMPLEXITY: Moderate   GOALS: Goals reviewed with patient? Yes  SHORT  TERM GOALS: Target date: 05/13/2022    Will be compliant with appropriate progressive HEP  Baseline: Goal status: IN Has based exercise program 2/24  2.  Lumbar and thoracic ROM to be no more than 25% limited on all planes  Baseline:  Goal status:IN not assessed today 2/21 3.  Muscle spasms to have improved by at least 50% to assist in pain control  Baseline:  Goal status:  Has muscle spasms 2/21 4.  Pain to be no more than 5/10 at worst  Baseline:  Goal status:IN PROGRESS - pain varies; often dependent on weather - 05/14/22    LONG TERM GOALS: Target date: 06/10/2022    Will improve gross LE strength by 5 pounds in order to show improved functional strength  Baseline:  Goal status:  modified 2/26  2.  Gait mechanics to have improved with resolution of deficits in ankle DF during swing phase to reduce fall risk  Baseline:  Goal status: feels like it is somewhat improved 2/26  3.  Will score at least 18 on DGI to show reduced fall risk and improve ease of community access for safe travel  Baseline:  Goal status: will assess next visit   4.  Will be able to ambulate at least 668f on 3MWT to show improved mobility and ability to access community for safe travel  Baseline:  Goal status: will assess next visit   5.  Will be compliant with appropriate gym based exercise program to maintain functional gains and prevent falls/recurrence of severe pain  Baseline:  Goal status: progressing towards goal 2/27   PLAN:  PT FREQUENCY: 2x/week  PT DURATION: 6 weeks  PLANNED INTERVENTIONS: Therapeutic exercises, Therapeutic activity, Neuromuscular re-education, Balance training, Gait training, Patient/Family education, Self Care, Joint mobilization, Aquatic Therapy, Dry Needling, Electrical stimulation, Spinal mobilization, Cryotherapy, Moist heat, Taping, Ultrasound, Ionotophoresis '4mg'$ /ml Dexamethasone, Manual therapy, and Re-evaluation.  PLAN FOR NEXT SESSION: We will continue to  progress land based program  DCarolyne LittlesPT DPT  06/04/22 8:41 AM [facility] [address] [address]Phone: [phone]  Fax:  [phone]

## 2022-06-09 ENCOUNTER — Ambulatory Visit (HOSPITAL_BASED_OUTPATIENT_CLINIC_OR_DEPARTMENT_OTHER): Payer: Medicare Other | Admitting: Physical Therapy

## 2022-06-09 ENCOUNTER — Encounter (HOSPITAL_BASED_OUTPATIENT_CLINIC_OR_DEPARTMENT_OTHER): Payer: Self-pay | Admitting: Physical Therapy

## 2022-06-09 DIAGNOSIS — M5459 Other low back pain: Secondary | ICD-10-CM

## 2022-06-09 DIAGNOSIS — M6281 Muscle weakness (generalized): Secondary | ICD-10-CM

## 2022-06-09 DIAGNOSIS — M546 Pain in thoracic spine: Secondary | ICD-10-CM

## 2022-06-09 NOTE — Therapy (Signed)
OUTPATIENT PHYSICAL THERAPY THORACOLUMBAR TREATMENT   Patient Name: Heather Hodges MRN: [id number] DOB:06-Aug-1945, 77 y.o., female Today's Date: 06/09/2022  END OF SESSION:  PT End of Session - 06/09/22 0859     Visit Number 14    Number of Visits 26    Date for PT Re-Evaluation 07/07/22    Authorization Type MCR and [employer]    Authorization Time Period 04/15/22 to 06/24/22    PT Start Time 0800    PT Stop Time 0843    PT Time Calculation (min) 43 min    Activity Tolerance Patient tolerated treatment well    Behavior During Therapy [facility] for tasks assessed/performed            Reporting Period 04/15/2022 to 05/26/2022  See note below for Objective Data and Assessment of Progress/Goals.        Past Medical History:  Diagnosis Date   Arthritis    Colon polyps    Diverticulosis    History of chicken pox    Hyperlipidemia    Hypothyroidism    Osteoporosis    Vitamin D deficiency    Past Surgical History:  Procedure Laterality Date   BREAST BIOPSY Left    Benign   TONSILLECTOMY AND ADENOIDECTOMY     Patient Active Problem List   Diagnosis Date Noted   Fibromyalgia 02/11/2021   Chronic neck pain 02/11/2021   Osteoporosis    Hypothyroidism    Hyperlipidemia    Diverticulosis    Arthritis    Chronic pain syndrome 02/03/2018   Contusion of left lung 07/20/2017   Cerebral contusion ([place]) 07/17/2017   Traumatic pneumothorax 07/17/2017   Rib fractures 07/16/2017   Vitamin D deficiency 08/06/2015    PCP: Inda Coke PA-C  REFERRING PROVIDER: Gregor Hams, MD  REFERRING DIAG: M54.50,G89.29 (ICD-10-CM) - Chronic bilateral low back pain without sciatica M54.6,G89.29 (ICD-10-CM) - Chronic bilateral thoracic back pain  Rationale for Evaluation and Treatment: Rehabilitation  THERAPY DIAG:  Other low back pain  Pain in thoracic spine  Muscle weakness (generalized)  ONSET DATE: 04/03/2022  SUBJECTIVE:                                                                                                                                                                                            SUBJECTIVE STATEMENT: The patient sid a lot over the weekend. She got a little tight but overall she did well.  PERTINENT HISTORY:  77 y.o. female with concussion complicated by acute exacerbation of chronic back pain.  She had a fall almost a month ago now and suffered a concussion as well as worsening  of her back pain.   Her diffuse soreness and especially her thoracic and lumbar back pain are her chief problems now.  She is reluctant to consider [facility] as she feels that hurt her previously.  Her primary care provider suggested aquatic physical therapy and I think that is a great idea.  She is willing to consider it as well.  Plan to refer to aquatic physical therapy.   She is having trouble sleeping especially due to the body soreness and back soreness.  Will try low-dose methocarbamol at bedtime.  If that does not work we can try other muscle relaxers or other medications.   Recheck in 3 weeks.    PAIN:  Are you having pain? Yes: NPRS scale: 3/10 Pain location: Right upper trap area Pain description: ache  Aggravating factors: movement in general, little of everything  Relieving factors: tylenol OA but "depends on what I've been doing"   PRECAUTIONS: Other: fall, hx of compression fractures   WEIGHT BEARING RESTRICTIONS: No  FALLS:  Has patient fallen in last 6 months? Yes. Number of falls 1; (-) FOF  LIVING ENVIRONMENT: Lives with: lives with their family Lives in: Other townhome  Stairs: no steps inside or outside  Has following equipment at home: Single point cane  OCCUPATION: retired   PLOF: Independent, Independent with basic ADLs, Independent with gait, and Independent with transfers  PATIENT GOALS: be able to travel again safely, get loosened up and feeling better   NEXT MD VISIT: Dr. Georgina Snell next week   OBJECTIVE:    DIAGNOSTIC FINDINGS:  FINDINGS: There is no evidence of thoracic spine fracture. Chronic compression deformity of thoracic vertebral bodies (upper and mid) are unchanged compared to prior exam. Mild anterior spurring is identified in the thoracic spine. Alignment is normal. No other significant bone abnormalities are identified.   IMPRESSION: No acute abnormality. Chronic compression deformity of thoracic vertebral bodies are unchanged compared to prior exam.  FINDINGS: There is no evidence of lumbar spine fracture. Grade 1 anterolisthesis of L4 on L5 is unchanged. Facet joint sclerosis is identified in the mid to lower lumbar spine.   IMPRESSION: No acute fracture or dislocation. Degenerative joint changes of lumbar spine which appears stable compared prior exam.  PATIENT SURVEYS:  FOTO will do 2nd session   SCREENING FOR RED FLAGS: Bowel or bladder incontinence: No Spinal tumors: No Cauda equina syndrome: No Compression fracture: No Abdominal aneurysm: No  COGNITION: Overall cognitive status: Within functional limits for tasks assessed     SENSATION: Not tested, but reports no hx of this   MUSCLE LENGTH:   POSTURE: rounded shoulders, forward head, decreased lumbar lordosis, increased thoracic kyphosis, and flexed trunk   PALPATION: Severe trigger points and mm spasms noted L rotator cuff, L latissimus, thoracic paraspinals- extremely sensitive   LUMBAR ROM:   AROM eval right  Flexion Mild limitation    Extension Severe limitation    Right lateral flexion Moderate limitation    Left lateral flexion Moderate limitation   Right rotation    Left rotation  75% limited wit pain    (Blank rows = not tested)  Thoracic ROM: flexion WNL, extension severe limitation, lateral flexion mild limitation B, R rotation severe limitation, L moderate limitation  LOWER EXTREMITY ROM:     MMT Right eval Left eval Right 2/26  Left  2/26   Hip flexion 3 3 8.9 13.1   Hip extension 2 at best (from bridge, unable to lift hips even with  assist of HS) 2 at best (from bridge, unable to lift hips even with assist of HS)    Hip abduction 3 3+ 8.9 19.9  Hip adduction      Hip internal rotation      Hip external rotation      Knee flexion 3 3    Knee extension 4- 4- 16.1 17.6  Ankle dorsiflexion 4 4    Ankle plantarflexion      Ankle inversion      Ankle eversion       (Blank rows = not tested)  LOWER EXTREMITY MMT:    MMT Right eval Left eval  Hip flexion    Hip extension    Hip abduction    Hip adduction    Hip internal rotation    Hip external rotation    Knee flexion    Knee extension    Ankle dorsiflexion    Ankle plantarflexion    Ankle inversion    Ankle eversion     (Blank rows = not tested)  CERVICAL ROM:   Active ROM A/PROM (deg) eval  Flexion 45  Extension 15  Right lateral flexion   Left lateral flexion   Right rotation 45  Left rotation 55   (Blank rows = not tested)   LUMBAR SPECIAL TESTS:    FUNCTIONAL TESTS:  3 minute walk test: 574f no rest breaks no device  Dynamic Gait Index: 13/24  GAIT: Distance walked: 5273fAssistive device utilized: None Level of assistance: Complete Independence Comments: limited trunk rotation, trendelenburg, flexed at hips, limited ankle DF B   TODAY'S TREATMENT:                                                                                                                              3/7  Seated Band hip abduction 2x10  Seated march with band 2x10 yellow Seated LAQ 2x10   Ball roll out 5x fwd and 5x side to side  3/6 Seated Band hip abduction 2x10  Seated march with band 2x10 yellow Seated LAQ 2x10   3/4 Ball roll out 5x fwd and 5x side to side   Seated Band hip abduction 2x10  Seated march with band 2x10 yellow Seated LAQ 2x10   Bilateral ER 2x10 yellow  Bilateral horizontal abduction 2x10 yellow   Reviewed total HEP  2/28 Ball roll out 5x fwd and 5x side to  side   Reviewed technique with PPT. Patient is doing well x10   Supine band Abduction 2x10 red   Standing hip 3 way with mod cuing for technique x10 each    Reviewed HEP and how to use at home     2/26  PATIENT EDUCATION:  Education details: aquatic progressions and modifications  Person educated: Patient and Spouse Education method: Explanation Education comprehension: verbalized understanding  HOME EXERCISE PROGRAM: Access Code: [email or web address] URL: [email or web address] Date: 05/28/2022 Prepared by: Carolyne Littles  ASSESSMENT:  CLINICAL IMPRESSION: Therapy kept patients exercises consistent today. She did well. She tolerated her exercises well. Next visit we will review her entire HEP. She was advised to think of any questions she can in preporation for D/C to HEP.    OBJECTIVE IMPAIRMENTS: Abnormal gait, decreased activity tolerance, decreased balance, decreased mobility, difficulty walking, decreased ROM, decreased strength, decreased safety awareness, hypomobility, increased fascial restrictions, impaired flexibility, improper body mechanics, postural dysfunction, and pain.   ACTIVITY LIMITATIONS: standing, squatting, stairs, transfers, and locomotion level  PARTICIPATION LIMITATIONS: meal prep, cleaning, laundry, shopping, community activity, yard work, and church  PERSONAL FACTORS: Age, Behavior pattern, Fitness, Past/current experiences, and Time since onset of injury/illness/exacerbation are also affecting patient's functional outcome.   REHAB POTENTIAL: Good  CLINICAL DECISION MAKING: Evolving/moderate complexity  EVALUATION COMPLEXITY: Moderate   GOALS: Goals reviewed with patient? Yes  SHORT TERM GOALS: Target date: 05/13/2022    Will be compliant with appropriate progressive HEP  Baseline: Goal status: IN Has based  exercise program 2/24  2.  Lumbar and thoracic ROM to be no more than 25% limited on all planes  Baseline:  Goal status:IN not assessed today 2/21 3.  Muscle spasms to have improved by at least 50% to assist in pain control  Baseline:  Goal status:  Has muscle spasms 2/21 4.  Pain to be no more than 5/10 at worst  Baseline:  Goal status:IN PROGRESS - pain varies; often dependent on weather - 05/14/22    LONG TERM GOALS: Target date: 06/10/2022    Will improve gross LE strength by 5 pounds in order to show improved functional strength  Baseline:  Goal status:  modified 2/26  2.  Gait mechanics to have improved with resolution of deficits in ankle DF during swing phase to reduce fall risk  Baseline:  Goal status: feels like it is somewhat improved 2/26  3.  Will score at least 18 on DGI to show reduced fall risk and improve ease of community access for safe travel  Baseline:  Goal status: will assess next visit   4.  Will be able to ambulate at least 631f on 3MWT to show improved mobility and ability to access community for safe travel  Baseline:  Goal status: will assess next visit   5.  Will be compliant with appropriate gym based exercise program to maintain functional gains and prevent falls/recurrence of severe pain  Baseline:  Goal status: progressing towards goal 2/27   PLAN:  PT FREQUENCY: 2x/week  PT DURATION: 6 weeks  PLANNED INTERVENTIONS: Therapeutic exercises, Therapeutic activity, Neuromuscular re-education, Balance training, Gait training, Patient/Family education, Self Care, Joint mobilization, Aquatic Therapy, Dry Needling, Electrical stimulation, Spinal mobilization, Cryotherapy, Moist heat, Taping, Ultrasound, Ionotophoresis '4mg'$ /ml Dexamethasone, Manual therapy, and Re-evaluation.  PLAN FOR NEXT SESSION: We will continue to progress land based program  DCarolyne LittlesPT DPT  06/09/22 9:00 AM [facility] [address] StreetGRamsey NAl[place] [address]Phone: [phone]  Fax:  [phone]

## 2022-06-11 ENCOUNTER — Ambulatory Visit (HOSPITAL_BASED_OUTPATIENT_CLINIC_OR_DEPARTMENT_OTHER): Payer: Medicare Other | Admitting: Physical Therapy

## 2022-06-11 ENCOUNTER — Encounter (HOSPITAL_BASED_OUTPATIENT_CLINIC_OR_DEPARTMENT_OTHER): Payer: Self-pay | Admitting: Physical Therapy

## 2022-06-11 DIAGNOSIS — M5459 Other low back pain: Secondary | ICD-10-CM | POA: Diagnosis not present

## 2022-06-11 DIAGNOSIS — M546 Pain in thoracic spine: Secondary | ICD-10-CM

## 2022-06-11 DIAGNOSIS — M6281 Muscle weakness (generalized): Secondary | ICD-10-CM

## 2022-06-11 NOTE — Therapy (Signed)
OUTPATIENT PHYSICAL THERAPY THORACOLUMBAR TREATMENT/discharge    Patient Name: Heather Hodges MRN: [id number] DOB:[birth year], 77 y.o., female Today's Date: 06/12/2022  END OF SESSION:  PT End of Session - 06/11/22 0807     Visit Number 15 (P)     Number of Visits 26 (P)     Date for PT Re-Evaluation 07/07/22 (P)     Authorization Type MCR and [employer] (P)     PT Start Time 0800 (P)     PT Stop Time 0842 (P)     PT Time Calculation (min) 42 min (P)     Activity Tolerance Patient tolerated treatment well (P)     Behavior During Therapy [facility] for tasks assessed/performed (P)             Reporting Period 04/15/2022 to 05/26/2022  See note below for Objective Data and Assessment of Progress/Goals.        Past Medical History:  Diagnosis Date   Arthritis    Colon polyps    Diverticulosis    History of chicken pox    Hyperlipidemia    Hypothyroidism    Osteoporosis    Vitamin D deficiency    Past Surgical History:  Procedure Laterality Date   BREAST BIOPSY Left    Benign   TONSILLECTOMY AND ADENOIDECTOMY     Patient Active Problem List   Diagnosis Date Noted   Fibromyalgia 02/11/2021   Chronic neck pain 02/11/2021   Osteoporosis    Hypothyroidism    Hyperlipidemia    Diverticulosis    Arthritis    Chronic pain syndrome 02/03/2018   Contusion of left lung 07/20/2017   Cerebral contusion ([place]) 07/17/2017   Traumatic pneumothorax 07/17/2017   Rib fractures 07/16/2017   Vitamin D deficiency 08/06/2015    PCP: Inda Coke PA-C  REFERRING PROVIDER: Gregor Hams, MD  REFERRING DIAG: M54.50,G89.29 (ICD-10-CM) - Chronic bilateral low back pain without sciatica M54.6,G89.29 (ICD-10-CM) - Chronic bilateral thoracic back pain  Rationale for Evaluation and Treatment: Rehabilitation  THERAPY DIAG:  Other low back pain  Pain in thoracic spine  Muscle weakness (generalized)  ONSET DATE: 04/03/2022  SUBJECTIVE:                                                                                                                                                                                            SUBJECTIVE STATEMENT: The patient was a little sore over the weekend. She was sore everywhere.   PERTINENT HISTORY:  77 y.o. female with concussion complicated by acute exacerbation of chronic back pain.  She had a fall almost a month ago now and suffered  a concussion as well as worsening of her back pain.   Her diffuse soreness and especially her thoracic and lumbar back pain are her chief problems now.  She is reluctant to consider [facility] as she feels that hurt her previously.  Her primary care provider suggested aquatic physical therapy and I think that is a great idea.  She is willing to consider it as well.  Plan to refer to aquatic physical therapy.   She is having trouble sleeping especially due to the body soreness and back soreness.  Will try low-dose methocarbamol at bedtime.  If that does not work we can try other muscle relaxers or other medications.   Recheck in 3 weeks.    PAIN:  Are you having pain? Yes: NPRS scale: 0/10 Pain location: Right upper trap area Pain description: ache  Aggravating factors: movement in general, little of everything  Relieving factors: tylenol OA but "depends on what I've been doing"   PRECAUTIONS: Other: fall, hx of compression fractures   WEIGHT BEARING RESTRICTIONS: No  FALLS:  Has patient fallen in last 6 months? Yes. Number of falls 1; (-) FOF  LIVING ENVIRONMENT: Lives with: lives with their family Lives in: Other townhome  Stairs: no steps inside or outside  Has following equipment at home: Single point cane  OCCUPATION: retired   PLOF: Independent, Independent with basic ADLs, Independent with gait, and Independent with transfers  PATIENT GOALS: be able to travel again safely, get loosened up and feeling better   NEXT MD VISIT: Dr. Georgina Snell next week   OBJECTIVE:    DIAGNOSTIC FINDINGS:  FINDINGS: There is no evidence of thoracic spine fracture. Chronic compression deformity of thoracic vertebral bodies (upper and mid) are unchanged compared to prior exam. Mild anterior spurring is identified in the thoracic spine. Alignment is normal. No other significant bone abnormalities are identified.   IMPRESSION: No acute abnormality. Chronic compression deformity of thoracic vertebral bodies are unchanged compared to prior exam.  FINDINGS: There is no evidence of lumbar spine fracture. Grade 1 anterolisthesis of L4 on L5 is unchanged. Facet joint sclerosis is identified in the mid to lower lumbar spine.   IMPRESSION: No acute fracture or dislocation. Degenerative joint changes of lumbar spine which appears stable compared prior exam.  PATIENT SURVEYS:  FOTO will do 2nd session   SCREENING FOR RED FLAGS: Bowel or bladder incontinence: No Spinal tumors: No Cauda equina syndrome: No Compression fracture: No Abdominal aneurysm: No  COGNITION: Overall cognitive status: Within functional limits for tasks assessed     SENSATION: Not tested, but reports no hx of this   MUSCLE LENGTH:   POSTURE: rounded shoulders, forward head, decreased lumbar lordosis, increased thoracic kyphosis, and flexed trunk   PALPATION: Severe trigger points and mm spasms noted L rotator cuff, L latissimus, thoracic paraspinals- extremely sensitive   LUMBAR ROM:   AROM eval right  Flexion Mild limitation    Extension Severe limitation    Right lateral flexion Moderate limitation    Left lateral flexion Moderate limitation   Right rotation    Left rotation  75% limited wit pain    (Blank rows = not tested)  Thoracic ROM: flexion WNL, extension severe limitation, lateral flexion mild limitation B, R rotation severe limitation, L moderate limitation  LOWER EXTREMITY ROM:     MMT Right eval Left eval Right 2/26  Left  2/26     Hip flexion 3 3 8.9 13.1  22.6 22.3  Hip extension 2  at best (from bridge, unable to lift hips even with assist of HS) 2 at best (from bridge, unable to lift hips even with assist of HS)      Hip abduction 3 3+ 8.9 19.9 28.0 31.0  Hip adduction        Hip internal rotation        Hip external rotation        Knee flexion 3 3      Knee extension 4- 4- 16.1 17.6 25.4 33.4  Ankle dorsiflexion 4 4      Ankle plantarflexion        Ankle inversion        Ankle eversion         (Blank rows = not tested)  LOWER EXTREMITY MMT:    MMT Right eval Left eval  Hip flexion    Hip extension    Hip abduction    Hip adduction    Hip internal rotation    Hip external rotation    Knee flexion    Knee extension    Ankle dorsiflexion    Ankle plantarflexion    Ankle inversion    Ankle eversion     (Blank rows = not tested)  CERVICAL ROM:   Active ROM A/PROM (deg) eval  Flexion 45  Extension 15  Right lateral flexion   Left lateral flexion   Right rotation 45  Left rotation 55   (Blank rows = not tested)   LUMBAR SPECIAL TESTS:    FUNCTIONAL TESTS:  3 minute walk test: 587f no rest breaks no device  Dynamic Gait Index: 13/24  GAIT: Distance walked: 5226fAssistive device utilized: None Level of assistance: Complete Independence Comments: limited trunk rotation, trendelenburg, flexed at hips, limited ankle DF B   TODAY'S TREATMENT:                                                                                                                              3/13 Reviewed complete HEP   Supine:   Bridge 2x10  Hip abduction 2x15 green  Supine march with education on progression. 2x10  Supine bridge 2x10   Reviewed goals   Reviewed standing exercises       3/7  Seated Band hip abduction 2x10  Seated march with band 2x10 yellow Seated LAQ 2x10   Ball roll out 5x fwd and 5x side to side  3/6 Seated Band hip abduction 2x10  Seated march with band 2x10 yellow Seated LAQ 2x10   3/4 Ball  roll out 5x fwd and 5x side to side   Seated Band hip abduction 2x10  Seated march with band 2x10 yellow Seated LAQ 2x10   Bilateral ER 2x10 yellow  Bilateral horizontal abduction 2x10 yellow   Reviewed total HEP  2/28 Ball roll out 5x fwd and 5x side to side   Reviewed technique with PPT. Patient is doing well x10   Supine band Abduction 2x10 red  Standing hip 3 way with mod cuing for technique x10 each    Reviewed HEP and how to use at home                                                                                                                                   PATIENT EDUCATION:  Education details: aquatic progressions and modifications  Person educated: Patient and Spouse Education method: Explanation Education comprehension: verbalized understanding  HOME EXERCISE PROGRAM: Access Code: [email or web address] URL: [email or web address] Date: 05/28/2022 Prepared by: Carolyne Littles  ASSESSMENT:  CLINICAL IMPRESSION: The patient has progressed very well. She has gotten herself into a consistent exercise program at home. She had no further questions on her HEP. She will also continue to work on walking for exercise at home. See below for goal specific progress. Her strength numbers have improved significantly. See below for goal specific progress.  OBJECTIVE IMPAIRMENTS: Abnormal gait, decreased activity tolerance, decreased balance, decreased mobility, difficulty walking, decreased ROM, decreased strength, decreased safety awareness, hypomobility, increased fascial restrictions, impaired flexibility, improper body mechanics, postural dysfunction, and pain.   ACTIVITY LIMITATIONS: standing, squatting, stairs, transfers, and locomotion level  PARTICIPATION LIMITATIONS: meal prep, cleaning, laundry, shopping, community activity, yard work, and church  PERSONAL FACTORS: Age, Behavior pattern, Fitness, Past/current experiences, and Time since onset of  injury/illness/exacerbation are also affecting patient's functional outcome.   REHAB POTENTIAL: Good  CLINICAL DECISION MAKING: Evolving/moderate complexity  EVALUATION COMPLEXITY: Moderate   GOALS: Goals reviewed with patient? Yes  SHORT TERM GOALS: Target date: 05/13/2022    Will be compliant with appropriate progressive HEP  Baseline: Goal status: IN Has based exercise program 2/24  2.  Lumbar and thoracic ROM to be no more than 25% limited on all planes  Baseline:  Goal status:IN not assessed today 2/21 3.  Muscle spasms to have improved by at least 50% to assist in pain control  Baseline:  Goal status:  Has muscle spasms 2/21 4.  Pain to be no more than 5/10 at worst  Baseline:  Goal status: no more then 5/10 at worst 3/14 achieved     LONG TERM GOALS: Target date: 06/10/2022    Will improve gross LE strength by 5 pounds in order to show improved functional strength  Baseline:  Goal status:  achieved 3/14  2.  Gait mechanics to have improved with resolution of deficits in ankle DF during swing phase to reduce fall risk  Baseline:  Goal status: improved per visual inspection 3/14  3.  Will score at least 18 on DGI to show reduced fall risk and improve ease of community access for safe travel  Baseline:  Goal status: achieved 3/14  4.  Will be able to ambulate at least 672f on 3MWT to show improved mobility and ability to access community for safe travel  Baseline:  Goal status: will assess next visit   5.  Will be compliant with appropriate gym based  exercise program to maintain functional gains and prevent falls/recurrence of severe pain  Baseline:  Goal status: progressing towards goal 2/27   PLAN:  PT FREQUENCY: 2x/week  PT DURATION: 6 weeks  PLANNED INTERVENTIONS: Therapeutic exercises, Therapeutic activity, Neuromuscular re-education, Balance training, Gait training, Patient/Family education, Self Care, Joint mobilization, Aquatic Therapy, Dry  Needling, Electrical stimulation, Spinal mobilization, Cryotherapy, Moist heat, Taping, Ultrasound, Ionotophoresis '4mg'$ /ml Dexamethasone, Manual therapy, and Re-evaluation.  PLAN FOR NEXT SESSION: We will continue to progress land based program   Carolyne Littles PT DPT  06/12/22 9:27 AM [facility] [address] [place], [place], [address] Phone: [phone]   Fax:  [phone]

## 2022-06-12 ENCOUNTER — Encounter (HOSPITAL_BASED_OUTPATIENT_CLINIC_OR_DEPARTMENT_OTHER): Payer: Self-pay | Admitting: Physical Therapy

## 2022-07-01 ENCOUNTER — Ambulatory Visit (INDEPENDENT_AMBULATORY_CARE_PROVIDER_SITE_OTHER): Payer: Medicare Other | Admitting: Family Medicine

## 2022-07-01 VITALS — BP 122/76 | HR 83 | Ht 60.0 in | Wt 130.0 lb

## 2022-07-01 DIAGNOSIS — M542 Cervicalgia: Secondary | ICD-10-CM

## 2022-07-01 DIAGNOSIS — G8929 Other chronic pain: Secondary | ICD-10-CM

## 2022-07-01 DIAGNOSIS — S060X0D Concussion without loss of consciousness, subsequent encounter: Secondary | ICD-10-CM | POA: Diagnosis not present

## 2022-07-01 DIAGNOSIS — M546 Pain in thoracic spine: Secondary | ICD-10-CM

## 2022-07-01 MED ORDER — DULOXETINE HCL 30 MG PO CPEP: 30.0000 mg | ORAL_CAPSULE | Freq: Every day | ORAL | 1 refills | Status: DC

## 2022-07-01 NOTE — Progress Notes (Signed)
   Shirlyn Goltz, PhD, LAT, ATC acting as a scribe for [name], MD.   Heather Hodges is a 77 y.o. female who presents to [place] [facility] today for f/u head injury and associated MSK related pains. Pt was last seen by Dr. Georgina Snell on 05/22/22 and was her Cymbalta was increased to 40mg  and to wean off the gabapentin. She has cont'd PT, completing 15 visits. Today, pt reports increased neck and head pain after Dr. Davy Pique performed cervical injections yesterday.  She had medial branch block cervical spine facets in preparation for ablation yesterday.  She notes she had immediate relief with pain as low as 2 out of 10 following the injections.  However after few hours the pain has returned and has worsened. Pt doesn't note much of a difference w/ the increased dose of Cymbalta and has weaned off of the gabapentin completely.   Pt is wondering about the pain in her chest, R-sided and into the midline. Issues w/ swallowing dry food and a burning sensation w/ salty foods.     Dx imaging: 04/24/22 C-spine XR 04/03/22 L-spine & T-spine XR 03/11/22 Head CT and R wrist, R knee, R foot, & R ankle XR    Pertinent review of systems: No fevers or chills  Relevant historical information: History of rib fractures and a traumatic pneumothorax in the past.   Exam:  BP 122/76   Pulse 83   Ht 5' (1.524 m)   Wt 130 lb (59 kg)   SpO2 95%   BMI 25.39 kg/m  General: Well Developed, well nourished, and in no acute distress.   MSK: C-spine decreased cervical motion.      Assessment and Plan: 77 y.o. female with neck pain and headache.  This is associated with history of concussion and chronic pain.  She had medial branch block recently with Dr. Davy Pique.  This is in preparation for facet medial branch ablation.  It sounds like the temporary of the block worked really well but her pain is worsened after the block.  Encouraged her to stick with the plan to proceed to ablation  in the future as it looks like the block confirmed that her pain will get better if she proceeds with ablation.  It sounds like Cymbalta has not been very helpful.  Her pain is not better controlled and she is having what sounds like GI side effects from Cymbalta.  Will start tapering back down on Cymbalta going from 40 mg daily to 30 mg daily now.  She stopped the gabapentin and having more radicular pain in the thoracic area.  Will increase back on gabapentin up to 200 to 300 mg at bedtime.  Recheck in 1 month.  Total encounter time 30 minutes including face-to-face time with the patient and, reviewing past medical record, and charting on the date of service.      PDMP not reviewed this encounter. No orders of the defined types were placed in this encounter.  Meds ordered this encounter  Medications   DULoxetine (CYMBALTA) 30 MG capsule    Sig: Take 1 capsule (30 mg total) by mouth daily.    Dispense:  90 capsule    Refill:  1     Discussed warning signs or symptoms. Please see discharge instructions. Patient expresses understanding.   The above documentation has been reviewed and is accurate and complete [name], M.D.

## 2022-07-01 NOTE — Patient Instructions (Signed)
Thank you for coming in today.   Plan to decrease cymbalta back to 30mg  daily.   Ok to restart gabapentin mostly just at bedtime. Start at 200mg  and ok to adjust as you see fit up to 300mg  up to 3x daily.   Return in about 2 months or sooner if needed.

## 2022-08-02 ENCOUNTER — Emergency Department (HOSPITAL_BASED_OUTPATIENT_CLINIC_OR_DEPARTMENT_OTHER)
Admission: EM | Admit: 2022-08-02 | Discharge: 2022-08-02 | Disposition: A | Discharge status: Home / Self Care | Payer: Medicare Other | Attending: Emergency Medicine | Admitting: Emergency Medicine

## 2022-08-02 ENCOUNTER — Emergency Department (HOSPITAL_BASED_OUTPATIENT_CLINIC_OR_DEPARTMENT_OTHER): Payer: Medicare Other | Admitting: Radiology

## 2022-08-02 ENCOUNTER — Emergency Department (HOSPITAL_BASED_OUTPATIENT_CLINIC_OR_DEPARTMENT_OTHER): Payer: Medicare Other

## 2022-08-02 ENCOUNTER — Other Ambulatory Visit: Payer: Self-pay

## 2022-08-02 ENCOUNTER — Encounter (HOSPITAL_BASED_OUTPATIENT_CLINIC_OR_DEPARTMENT_OTHER): Payer: Self-pay | Admitting: Emergency Medicine

## 2022-08-02 DIAGNOSIS — R4781 Slurred speech: Secondary | ICD-10-CM | POA: Insufficient documentation

## 2022-08-02 DIAGNOSIS — R251 Tremor, unspecified: Secondary | ICD-10-CM | POA: Diagnosis not present

## 2022-08-02 DIAGNOSIS — R55 Syncope and collapse: Secondary | ICD-10-CM | POA: Diagnosis present

## 2022-08-02 LAB — URINALYSIS, ROUTINE W REFLEX MICROSCOPIC
Bacteria, UA: NONE SEEN
Bilirubin Urine: NEGATIVE
Glucose, UA: NEGATIVE mg/dL
Ketones, ur: NEGATIVE mg/dL
Leukocytes,Ua: NEGATIVE
Nitrite: NEGATIVE
Protein, ur: NEGATIVE mg/dL
Specific Gravity, Urine: 1.018 (ref 1.005–1.030)
pH: 6.5 (ref 5.0–8.0)

## 2022-08-02 LAB — CBC WITH DIFFERENTIAL/PLATELET
Abs Immature Granulocytes: 0.03 10*3/uL (ref 0.00–0.07)
Basophils Absolute: 0 10*3/uL (ref 0.0–0.1)
Basophils Relative: 1 %
Eosinophils Absolute: 0.1 10*3/uL (ref 0.0–0.5)
Eosinophils Relative: 1 %
HCT: 34.2 % — ABNORMAL LOW (ref 36.0–46.0)
Hemoglobin: 11.4 g/dL — ABNORMAL LOW (ref 12.0–15.0)
Immature Granulocytes: 0 %
Lymphocytes Relative: 34 %
Lymphs Abs: 2.3 10*3/uL (ref 0.7–4.0)
MCH: 31.5 pg (ref 26.0–34.0)
MCHC: 33.3 g/dL (ref 30.0–36.0)
MCV: 94.5 fL (ref 80.0–100.0)
Monocytes Absolute: 0.6 10*3/uL (ref 0.1–1.0)
Monocytes Relative: 8 %
Neutro Abs: 3.8 10*3/uL (ref 1.7–7.7)
Neutrophils Relative %: 56 %
Platelets: 253 10*3/uL (ref 150–400)
RBC: 3.62 MIL/uL — ABNORMAL LOW (ref 3.87–5.11)
RDW: 13 % (ref 11.5–15.5)
WBC: 6.8 10*3/uL (ref 4.0–10.5)
nRBC: 0 % (ref 0.0–0.2)

## 2022-08-02 LAB — TROPONIN I (HIGH SENSITIVITY)
Troponin I (High Sensitivity): 2 ng/L (ref <18)
Troponin I (High Sensitivity): 2 ng/L (ref <18)

## 2022-08-02 LAB — COMPREHENSIVE METABOLIC PANEL
ALT: 16 U/L (ref 0–44)
AST: 17 U/L (ref 15–41)
Albumin: 3.9 g/dL (ref 3.5–5.0)
Alkaline Phosphatase: 55 U/L (ref 38–126)
Anion gap: 7 (ref 5–15)
BUN: 13 mg/dL (ref 8–23)
CO2: 27 mmol/L (ref 22–32)
Calcium: 8.6 mg/dL — ABNORMAL LOW (ref 8.9–10.3)
Chloride: 102 mmol/L (ref 98–111)
Creatinine, Ser: 0.75 mg/dL (ref 0.44–1.00)
GFR, Estimated: >60 mL/min (ref >60)
Glucose, Bld: 79 mg/dL (ref 70–99)
Potassium: 4.4 mmol/L (ref 3.5–5.1)
Sodium: 136 mmol/L (ref 135–145)
Total Bilirubin: 0.4 mg/dL (ref 0.3–1.2)
Total Protein: 6.3 g/dL — ABNORMAL LOW (ref 6.5–8.1)

## 2022-08-02 MED ORDER — SODIUM CHLORIDE 0.9 % IV BOLUS
1000.0000 mL | Freq: Once | INTRAVENOUS | Status: AC
  Administered 2022-08-02: 1000 mL via INTRAVENOUS

## 2022-08-02 NOTE — Discharge Instructions (Addendum)
You came to the emergency department today after an episode of shaking and needing to be lowered to the ground.  Your workup is reassuring today.  Your heart does not appear to have any damage.  Your head CT was normal as well.  At this time I believe it is reasonable for you to follow-up with your PCP for a posthospital follow-up.  Please call them on Monday.  With any further episodes, especially slurred speech, falls, numbness, tingling or weakness please return to the nearest emergency department.
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
## 2022-08-06 ENCOUNTER — Ambulatory Visit (INDEPENDENT_AMBULATORY_CARE_PROVIDER_SITE_OTHER): Payer: Medicare Other | Admitting: Physician Assistant

## 2022-08-06 ENCOUNTER — Encounter: Payer: Self-pay | Admitting: Physician Assistant

## 2022-08-06 VITALS — BP 130/78 | HR 80 | Temp 97.7°F | Ht 60.0 in | Wt 131.5 lb

## 2022-08-06 DIAGNOSIS — R55 Syncope and collapse: Secondary | ICD-10-CM

## 2022-08-06 DIAGNOSIS — R251 Tremor, unspecified: Secondary | ICD-10-CM | POA: Diagnosis not present

## 2022-08-06 NOTE — Patient Instructions (Signed)
It was great to see you!  I'm going to place referral to neurology, order MRI and also reach out to Dr Denyse Amass regarding your case.  If any worsening symptom(s) in the meantime, please reach out and go to the ER  Take care,  Jarold Motto PA-C
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
## 2022-08-07 ENCOUNTER — Telehealth: Payer: Self-pay | Admitting: Physician Assistant

## 2022-08-07 NOTE — Telephone Encounter (Signed)
Contacted Heather Hodges to schedule their annual wellness visit. Appointment made for 08/19/2022.  [facility] TEAM Direct Dial [phone]
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
## 2022-08-13 ENCOUNTER — Ambulatory Visit (INDEPENDENT_AMBULATORY_CARE_PROVIDER_SITE_OTHER): Payer: Medicare Other | Admitting: Family Medicine

## 2022-08-13 ENCOUNTER — Ambulatory Visit
Admission: RE | Admit: 2022-08-13 | Discharge: 2022-08-13 | Disposition: A | Discharge status: Home / Self Care | Payer: Medicare Other | Source: Ambulatory Visit | Attending: Physician Assistant | Admitting: Physician Assistant

## 2022-08-13 VITALS — BP 118/68 | HR 83 | Ht 60.0 in | Wt 131.0 lb

## 2022-08-13 DIAGNOSIS — S060X0D Concussion without loss of consciousness, subsequent encounter: Secondary | ICD-10-CM | POA: Diagnosis not present

## 2022-08-13 DIAGNOSIS — D649 Anemia, unspecified: Secondary | ICD-10-CM

## 2022-08-13 DIAGNOSIS — D509 Iron deficiency anemia, unspecified: Secondary | ICD-10-CM

## 2022-08-13 DIAGNOSIS — R202 Paresthesia of skin: Secondary | ICD-10-CM

## 2022-08-13 DIAGNOSIS — R29898 Other symptoms and signs involving the musculoskeletal system: Secondary | ICD-10-CM | POA: Diagnosis not present

## 2022-08-13 DIAGNOSIS — E8809 Other disorders of plasma-protein metabolism, not elsewhere classified: Secondary | ICD-10-CM

## 2022-08-13 DIAGNOSIS — R55 Syncope and collapse: Secondary | ICD-10-CM

## 2022-08-13 LAB — VITAMIN B12: Vitamin B-12: 450 pg/mL (ref 211–911)

## 2022-08-13 NOTE — Patient Instructions (Addendum)
Thank you for coming in today.   We will proceed to nerve study at Dr Moises Blood office.   Please get labs today before you leave   Recheck following the nerve study coming back.   Ok to delay or cancel the appointment on June 4th.

## 2022-08-14 ENCOUNTER — Other Ambulatory Visit: Payer: Self-pay

## 2022-08-14 DIAGNOSIS — R202 Paresthesia of skin: Secondary | ICD-10-CM

## 2022-08-14 LAB — IRON,TIBC AND FERRITIN PANEL
%SAT: 26 % (calc) (ref 16–45)
Ferritin: 53 ng/mL (ref 16–288)
Iron: 74 ug/dL (ref 45–160)
TIBC: 280 mcg/dL (calc) (ref 250–450)

## 2022-08-14 LAB — PTH, INTACT AND CALCIUM
Calcium: 9.1 mg/dL (ref 8.6–10.4)
PTH: 46 pg/mL (ref 16–77)

## 2022-08-14 NOTE — Progress Notes (Signed)
So far labs look okay.  We are waiting on 1 lab to come back.

## 2022-08-15 ENCOUNTER — Ambulatory Visit (INDEPENDENT_AMBULATORY_CARE_PROVIDER_SITE_OTHER): Payer: Medicare Other | Admitting: Neurology

## 2022-08-15 DIAGNOSIS — R202 Paresthesia of skin: Secondary | ICD-10-CM | POA: Diagnosis not present

## 2022-08-15 NOTE — Procedures (Signed)
[facility]  [address] [place], [address]  [place], [place] [address] Tel: [phone] Fax: [phone] Test Date:  08/15/2022  Patient: Heather Hodges DOB: [birth year] Physician: Nita Sickle, DO  Sex: Female Height: 5\' 0"  Ref Phys: [name], MD  ID#: [id number]   Technician:    History: This is a 77 year old female referred for evaluation of bilateral leg paresthesias, dizziness, and gait instability.  NCV & EMG Findings: Electrodiagnostic testing of the right lower extremity and additional studies of the left shows: Bilateral sural and superficial peroneal sensory responses are within normal limits. Bilateral peroneal and tibial motor responses are within normal limits. Bilateral tibial H reflex studies are within normal limits. There is no evidence of active or chronic motor axonal changes affecting any of the tested muscles.  Motor unit configuration and recruitment pattern is within normal limits.   Impression: This is a normal study of the lower extremities.  In particular, there is no evidence of a large fiber sensorimotor polyneuropathy, acute inflammatory demyelinating polyradiculoneuropathy, or lumbosacral radiculopathy.    ___________________________ Nita Sickle, DO    Nerve Conduction Studies   Stim Site NR Peak (ms) Norm Peak (ms) O-P Amp (V) Norm O-P Amp  Left Sup Peroneal Anti Sensory (Ant Lat Mall)  32 C  12 cm    2.3 <4.6 14.1 >3  Right Sup Peroneal Anti Sensory (Ant Lat Mall)  32 C  12 cm    2.3 <4.6 12.5 >3  Left Sural Anti Sensory (Lat Mall)  32 C  Calf    2.1 <4.6 12.3 >3  Right Sural Anti Sensory (Lat Mall)  32 C  Calf    2.4 <4.6 14.0 >3     Stim Site NR Onset (ms) Norm Onset (ms) O-P Amp (mV) Norm O-P Amp Site1 Site2 Delta-0 (ms) Dist (cm) Vel (m/s) Norm Vel (m/s)  Left Peroneal Motor (Ext Dig Brev)  32 C  Ankle    3.2 <6.0 4.6 >2.5 B Fib Ankle 6.6 31.0 47 >40  B Fib    9.8  4.3  Poplt B Fib 1.5 8.0 53 >40  Poplt    11.3   4.3         Right Peroneal Motor (Ext Dig Brev)  32 C  Ankle    2.9 <6.0 3.6 >2.5 B Fib Ankle 6.3 34.0 54 >40  B Fib    9.2  3.3  Poplt B Fib 1.7 8.0 47 >40  Poplt    10.9  3.2         Left Tibial Motor (Abd Hall Brev)  32 C  Ankle    3.6 <6.0 11.6 >4 Knee Ankle 8.0 37.0 46 >40  Knee    11.6  9.2         Right Tibial Motor (Abd Hall Brev)  32 C  Ankle    3.9 <6.0 8.0 >4 Knee Ankle 7.5 39.0 52 >40  Knee    11.4  4.3          Electromyography   Side Muscle Ins.Act Fibs Fasc Recrt Amp Dur Poly Activation Comment  Right AntTibialis Nml Nml Nml Nml Nml Nml Nml Nml N/A  Right Gastroc Nml Nml Nml Nml Nml Nml Nml Nml N/A  Right Flex Dig Long Nml Nml Nml Nml Nml Nml Nml Nml N/A  Right RectFemoris Nml Nml Nml Nml Nml Nml Nml Nml N/A  Right GluteusMed Nml Nml Nml Nml Nml Nml Nml Nml N/A  Left AntTibialis Nml Nml Nml  Nml Nml Nml Nml Nml N/A  Left Gastroc Nml Nml Nml Nml Nml Nml Nml Nml N/A  Left Flex Dig Long Nml Nml Nml Nml Nml Nml Nml Nml N/A  Left RectFemoris Nml Nml Nml Nml Nml Nml Nml Nml N/A  Left GluteusMed Nml Nml Nml Nml Nml Nml Nml Nml N/A      Waveforms:

## 2022-08-16 LAB — PROTEIN ELECTROPHORESIS, SERUM
Albumin ELP: 3.8 g/dL (ref 3.8–4.8)
Alpha 1: 0.3 g/dL (ref 0.2–0.3)
Alpha 2: 0.7 g/dL (ref 0.5–0.9)
Beta 2: 0.4 g/dL (ref 0.2–0.5)
Beta Globulin: 0.4 g/dL (ref 0.4–0.6)
Gamma Globulin: 0.9 g/dL (ref 0.8–1.7)
Total Protein: 6.5 g/dL (ref 6.1–8.1)
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
## 2022-08-18 NOTE — Progress Notes (Signed)
Protein electrophoresis lab looks normal.  This is good news.

## 2022-08-18 NOTE — Progress Notes (Signed)
Nerve conduction study came back normal.  No evidence of AIDP.  There are of course more test to do if we need to. Labs looked okay.  Recommend coming in for a return visit in about 2 weeks.

## 2022-08-19 ENCOUNTER — Encounter: Payer: Self-pay | Admitting: Neurology

## 2022-08-19 ENCOUNTER — Ambulatory Visit (INDEPENDENT_AMBULATORY_CARE_PROVIDER_SITE_OTHER): Payer: Medicare Other | Admitting: Neurology

## 2022-08-19 VITALS — BP 111/63 | HR 82 | Ht 60.0 in | Wt 131.0 lb

## 2022-08-19 DIAGNOSIS — G894 Chronic pain syndrome: Secondary | ICD-10-CM

## 2022-08-19 DIAGNOSIS — M47812 Spondylosis without myelopathy or radiculopathy, cervical region: Secondary | ICD-10-CM | POA: Diagnosis not present

## 2022-08-19 NOTE — Patient Instructions (Signed)
I think your symptoms are related to chronic pain.  I would continue working with your pain doctors.

## 2022-08-20 ENCOUNTER — Other Ambulatory Visit: Payer: Self-pay | Admitting: Family Medicine

## 2022-08-20 NOTE — Telephone Encounter (Signed)
Pt states she has been diagnosed with chronic pain/arthritis in multiple areas.Requesting a med to help her with overall pain.

## 2022-08-21 NOTE — Telephone Encounter (Signed)
You were prescribed Meloxicam in the past. Would you like me to try that again? Alternative would be pain medicine like opiates like Hydrocodone.

## 2022-08-22 ENCOUNTER — Encounter: Payer: Medicare Other | Admitting: Neurology

## 2022-08-22 MED ORDER — MELOXICAM 15 MG PO TABS: 15.0000 mg | ORAL_TABLET | Freq: Every day | ORAL | 1 refills | Status: DC

## 2022-08-22 MED ORDER — METHOCARBAMOL 500 MG PO TABS: 500.0000 mg | ORAL_TABLET | Freq: Every evening | ORAL | 1 refills | Status: DC | PRN

## 2022-08-22 NOTE — Telephone Encounter (Signed)
Called pt, left VM to call the office.  

## 2022-08-22 NOTE — Telephone Encounter (Signed)
Pt returned call, she would like to stick with the Meloxicam for now but will need refills sent to [facility] for 90 day supplies.   Pt also requesting 90 day supply of Methocarbamol be sent to [facility].   Meloxicam refilled. Rx for Methocarbamol pending.   Forwarding to Dr. Denyse Amass regarding 90 supply of Methocarbamol.

## 2022-08-24 ENCOUNTER — Other Ambulatory Visit: Payer: Medicare Other

## 2022-09-01 ENCOUNTER — Encounter: Payer: Self-pay | Admitting: Physician Assistant

## 2022-09-01 ENCOUNTER — Ambulatory Visit (INDEPENDENT_AMBULATORY_CARE_PROVIDER_SITE_OTHER): Payer: Medicare Other | Admitting: Physician Assistant

## 2022-09-01 VITALS — BP 122/70 | HR 77 | Temp 97.5°F | Ht 60.0 in | Wt 132.2 lb

## 2022-09-01 DIAGNOSIS — M1990 Unspecified osteoarthritis, unspecified site: Secondary | ICD-10-CM

## 2022-09-01 DIAGNOSIS — G894 Chronic pain syndrome: Secondary | ICD-10-CM

## 2022-09-01 DIAGNOSIS — R0781 Pleurodynia: Secondary | ICD-10-CM

## 2022-09-01 LAB — SEDIMENTATION RATE: Sed Rate: 10 mm/hr (ref 0–30)

## 2022-09-01 LAB — C-REACTIVE PROTEIN: CRP: <1 mg/dL (ref 0.5–20.0)

## 2022-09-01 NOTE — Patient Instructions (Signed)
It was great to see you!  Blood work today [facility] rheumatology referral  [facility] Address: [address], [place], [place] [address] Phone: [phone]  Take care,  Jarold Motto PA-C

## 2022-09-01 NOTE — Progress Notes (Signed)
Heather Hodges is a 77 y.o. female here for a follow up of a pre-existing problem.  History of Present Illness:   Chief Complaint  Patient presents with   Arthritis    Pt would like a referral to Arthritis specialist.    HPI  Arthritis: She complains of chronic pain due to her arthritis.  Is interested in a referral to a rheumatologist.  Takes Methocarbamol 500 mg nightly and also in the morning - does not make her sleepy. Compliant with Gabapentin, 100 mg in the morning and 200 mg at night.  Taking Cymbalta 30 mg nightly and mobic 15 mg daily as well.  Pain under left breast, over ribs:  She complains of intermittent pain under her left breast, over the ribs.  She has been staying active and exercising.  She typically has pain here but feels like its worsened with her exercises Denies SHORTNESS OF BREATH, chest pain, severe cough  Past Medical History:  Diagnosis Date   Arthritis    Colon polyps    Diverticulosis    History of chicken pox    Hyperlipidemia    Hypothyroidism    Osteoporosis    Vitamin D deficiency      Social History   Tobacco Use   Smoking status: Never   Smokeless tobacco: Never  Substance Use Topics   Alcohol use: Never   Drug use: Never    Past Surgical History:  Procedure Laterality Date   BREAST BIOPSY Left    Benign   TONSILLECTOMY AND ADENOIDECTOMY      Family History  Problem Relation Age of Onset   Arthritis Mother    Breast cancer Mother        59s-70s   High Cholesterol Mother    Heart attack Father    High Cholesterol Father    Arthritis Brother    Hearing loss Brother    Hypercholesterolemia Brother     Allergies  Allergen Reactions   Risedronate Sodium Other (See Comments)    TMJ dysfunction    Current Medications:   Current Outpatient Medications:    acetaminophen (TYLENOL 8 HOUR ARTHRITIS PAIN) 650 MG CR tablet, Take 650 mg by mouth every 8 (eight) hours as needed for pain., Disp: , Rfl:    atorvastatin  (LIPITOR) 80 MG tablet, Take 1 tablet (80 mg total) by mouth daily., Disp: 90 tablet, Rfl: 3   b complex vitamins capsule, Take 1 capsule by mouth every other day., Disp: , Rfl:    Calcium Carbonate-Vitamin D (CALCIUM 500+D PO), Take 2 each by mouth every other day., Disp: , Rfl:    DULoxetine (CYMBALTA) 30 MG capsule, Take 1 capsule (30 mg total) by mouth daily., Disp: 90 capsule, Rfl: 1   gabapentin (NEURONTIN) 100 MG capsule, Take 2 capsules (200 mg total) by mouth 3 (three) times daily. (Patient taking differently: Take 100 mg by mouth. TAKING 100 MG IN THE AM AND 200 MG AT BEDTIME), Disp: 540 capsule, Rfl: 1   ketoconazole (NIZORAL) 2 % cream, Apply to affected area 1-2 times daily, Disp: 15 g, Rfl: 0   levothyroxine (SYNTHROID) 75 MCG tablet, Take 1 tablet (75 mcg total) by mouth every other day., Disp: 90 tablet, Rfl: 1   levothyroxine (SYNTHROID) 88 MCG tablet, Take 1 tablet (88 mcg total) by mouth every other day., Disp: 90 tablet, Rfl: 1   lidocaine (XYLOCAINE) 4 % external solution, Apply topically., Disp: , Rfl:    Lidocaine 4 % PTCH, Place onto the  skin., Disp: , Rfl:    meloxicam (MOBIC) 15 MG tablet, Take 1 tablet (15 mg total) by mouth daily., Disp: 90 tablet, Rfl: 1   methocarbamol (ROBAXIN) 500 MG tablet, Take 1 tablet (500 mg total) by mouth at bedtime as needed for muscle spasms., Disp: 90 tablet, Rfl: 1   Multiple Vitamins-Minerals (CENTRUM SILVER 50+WOMEN PO), Take 1 tablet by mouth daily in the afternoon., Disp: , Rfl:    Omega-3 Fatty Acids (OMEGA 3 500 PO), Take 1 capsule by mouth daily in the afternoon., Disp: , Rfl:    OVER THE COUNTER MEDICATION, Apply topically as needed. Hempvana, Disp: , Rfl:    SYSTANE ULTRA 0.4-0.3 % SOLN, SMARTSIG:1 Drop(s) In Eye(s) PRN, Disp: , Rfl:    Review of Systems:   ROS Negative unless otherwise specified per HPI.  Vitals:   Vitals:   09/01/22 0912  BP: 122/70  Pulse: 77  Temp: (!) 97.5 F (36.4 C)  TempSrc: Temporal  SpO2:  95%  Weight: 132 lb 4 oz (60 kg)  Height: 5' (1.524 m)     Body mass index is 25.83 kg/m.  Physical Exam:   Physical Exam Constitutional:      Appearance: Normal appearance. She is well-developed.  HENT:     Head: Normocephalic and atraumatic.  Eyes:     General: Lids are normal.     Extraocular Movements: Extraocular movements intact.     Conjunctiva/sclera: Conjunctivae normal.  Pulmonary:     Effort: Pulmonary effort is normal.  Musculoskeletal:        General: Normal range of motion.     Cervical back: Normal range of motion and neck supple.     Comments: Tenderness to left lower rib cage; no palpable deformity  Skin:    General: Skin is warm and dry.  Neurological:     Mental Status: She is alert and oriented to person, place, and time.  Psychiatric:        Attention and Perception: Attention and perception normal.        Mood and Affect: Mood normal.        Behavior: Behavior normal.        Thought Content: Thought content normal.        Judgment: Judgment normal.     Assessment and Plan:   Rib pain on left side; Arthritis; Chronic pain syndrome No red flags Will update blood work and preliminary rheum work-up as I do not see where this has been done in the past Referral to rheumatology placed today Continue current management with Sports Medicine Consider limited exercises if increase is causing rib pain   I,Rachel Rivera,acting as a scribe for Jarold Motto, PA.,have documented all relevant documentation on the behalf of Jarold Motto, PA,as directed by  Jarold Motto, PA while in the presence of Jarold Motto, [place].  I, Jarold Motto, [place], have reviewed all documentation for this visit. The documentation on 09/01/22 for the exam, diagnosis, procedures, and orders are all accurate and complete.   Jarold Motto, PA-C

## 2022-09-02 ENCOUNTER — Encounter: Payer: Medicare Other | Admitting: Family Medicine

## 2022-09-03 ENCOUNTER — Telehealth: Payer: Self-pay | Admitting: Physician Assistant

## 2022-09-03 LAB — CYCLIC CITRUL PEPTIDE ANTIBODY, IGG/IGA: Cyclic Citrullin Peptide Ab: 6 units (ref 0–19)

## 2022-09-03 LAB — ANTI-NUCLEAR AB-TITER (ANA TITER): ANA Titer 1: 1:320 {titer} — ABNORMAL HIGH

## 2022-09-03 LAB — RHEUMATOID FACTOR: Rheumatoid fact SerPl-aCnc: <10 IU/mL (ref <14)

## 2022-09-03 LAB — ANA: Anti Nuclear Antibody (ANA): POSITIVE — AB

## 2022-09-03 NOTE — Telephone Encounter (Signed)
Pt would like a call back with lab results 

## 2022-09-04 NOTE — Telephone Encounter (Signed)
Patient returned call. Requests to be called. 

## 2022-09-04 NOTE — Telephone Encounter (Signed)
See result notes. 

## 2022-09-22 ENCOUNTER — Encounter: Payer: Medicare Other | Admitting: Family Medicine
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
## 2022-09-24 ENCOUNTER — Ambulatory Visit (INDEPENDENT_AMBULATORY_CARE_PROVIDER_SITE_OTHER): Payer: Medicare Other | Admitting: Family Medicine

## 2022-09-24 VITALS — BP 112/68 | HR 76 | Ht 60.0 in | Wt 131.0 lb

## 2022-09-24 DIAGNOSIS — G8929 Other chronic pain: Secondary | ICD-10-CM

## 2022-09-24 DIAGNOSIS — M542 Cervicalgia: Secondary | ICD-10-CM

## 2022-09-24 DIAGNOSIS — S060X0D Concussion without loss of consciousness, subsequent encounter: Secondary | ICD-10-CM

## 2022-09-24 MED ORDER — DULOXETINE HCL 20 MG PO CPEP
20.0000 mg | ORAL_CAPSULE | Freq: Every day | ORAL | 3 refills | Status: DC
Start: 2022-09-24 — End: 2022-11-04

## 2022-09-24 NOTE — Patient Instructions (Addendum)
Thank you for coming in today.   To wean off Duloxetine stop 30mg  now.  Start 20mg  at bedtime and take for 2 weeks then stop entirely.   OK to take the gabapentin as needed.   Recheck as needed.

## 2022-10-24 ENCOUNTER — Ambulatory Visit: Payer: Medicare Other | Admitting: Family Medicine

## 2022-10-24 VITALS — BP 132/76 | HR 85 | Ht 60.0 in | Wt 131.0 lb

## 2022-10-24 DIAGNOSIS — M542 Cervicalgia: Secondary | ICD-10-CM | POA: Diagnosis not present

## 2022-10-24 DIAGNOSIS — R531 Weakness: Secondary | ICD-10-CM | POA: Diagnosis not present

## 2022-10-24 DIAGNOSIS — G8929 Other chronic pain: Secondary | ICD-10-CM | POA: Diagnosis not present

## 2022-10-24 NOTE — Patient Instructions (Addendum)
Thank you for coming in today.   If this weakness occurs again I would suggest going to the emergency room.   I do think rechecking with Sam is a good idea soon.   It could have been due to higher doses of Gabapentin.   Next step could be a MRI brain to look for history of stroke.  If this worsens go to the ER.

## 2022-10-24 NOTE — Progress Notes (Signed)
Heather Payor, PhD, LAT, ATC acting as a scribe for [name], MD.  Heather Hodges is a 77 y.o. female who presents to [facility] at [facility] today for f/u head injury, associated MSK related pains, and rx management. Pt was last seen by Dr. Denyse Amass on 09/24/22 and they discussed OK to wean off of duloxetine and to f/u w/ Dr. Lorrine Kin.   Zhaniyah had an incident about 2 nights ago.  She woke up from sleep and felt weak and had difficulty with moving her body.  She felt a little bit short winded.  This passed after an hour or 2.  She never did go to the emergency room for this.  She notes the the day before she had this incident she had run out of her own 100 mg gabapentin pills and took her husband's 300 mg gabapentin pills.  She is not sure if this was the cause or not.  She is feeling better for the most part now.  Additionally she notes some worsening right-sided neck pain after her radiofrequency ablation right C2-3 by Dr. Lorrine Kin recently.  She is not sure if that is causing her symptoms or not.  She also has some pain in her legs.  Over the interim she did taper off of Cymbalta as recommended and has been feeling okay.  She took her last Cymbalta dose a week or 2 ago.   Dx testing: 09/01/22 Labs             08/15/22 EMG/NCV              08/13/22 Labs 08/13/22 Brain MRI 08/02/22 Head CT 04/24/22 C-spine XR 04/03/22 L-spine & T-spine XR 03/11/22 Head CT and R wrist, R knee, R foot, & R ankle XR  Pertinent review of systems: No fevers or chills.  Positive for episode of weakness.  Relevant historical information: History of cerebral contusion years ago.  History of rib fracture.   Exam:  BP 132/76   Pulse 85   Ht 5' (1.524 m)   Wt 131 lb (59.4 kg)   SpO2 98%   BMI 25.58 kg/m  General: Well Developed, well nourished, and in no acute distress.   Neuro: Alert and oriented.  Normal coordination.  Next Slow somewhat broad-based gait.  En bloc turn. Right lower  extremity is slightly weak to hip flexion and knee extension 4+/5 compared to left.  Otherwise lower extremity strength and reflexes are intact. No tremor.    MSK: C-spine: Normal appearing Nontender midline.  Tender palpation right and left cervical paraspinal musculature.  Decreased cervical motion.   Lab and Radiology Results  EXAM: MRI HEAD WITHOUT CONTRAST   TECHNIQUE: Multiplanar, multiecho pulse sequences of the brain and surrounding structures were obtained without intravenous contrast.   COMPARISON:  Head CT 08/02/2022. Brain MRI 12/14/2020. Brain MRI 12/12/2020.   FINDINGS: Brain:   No age advanced or lobar predominant parenchymal atrophy.   Multifocal T2 FLAIR hyperintense signal abnormality within the cerebral white matter, nonspecific but compatible with minimal chronic small vessel ischemic disease.   Unchanged chronic hemosiderin deposition along the right frontoparietal lobes consistent with sequelae of remote subarachnoid hemorrhage.   There is no acute infarct.   No evidence of an intracranial mass.   No extra-axial fluid collection.   No midline shift.   Vascular: Maintained flow voids within the proximal large arterial vessels.   Skull and upper cervical spine: No focal suspicious marrow lesion.   Sinuses/Orbits:  No mass or acute finding within the imaged orbits. Prior bilateral ocular lens replacement. Small mucous retention cyst within the right maxillary sinus.   IMPRESSION: 1.  No evidence of an acute intracranial abnormality. 2. Minimal chronic small-vessel ischemic changes within the cerebral white matter, similar to the prior brain MRI of 12/12/2020. 3. Unchanged chronic hemosiderin deposition along the right frontoparietal lobes consistent with sequelae of remote subarachnoid hemorrhage. 4. Small mucous retention cyst within the right maxillary sinus.     Electronically Signed   By: Jackey Loge D.O.   On: 08/13/2022 08:28    I, [name], personally (independently) visualized and performed the interpretation of the images attached in this note.    Assessment and Plan: 77 y.o. female with episode of weakness occurring a day ago. It is impossible to really tell here in clinic what actually happened.  This is most likely due to changing gabapentin dose.  However her symptoms could have been a TIA.  She had a brain MRI about 2 months ago that does not show any history of stroke but certainly she could have had 1 in the interim.  Today she is pretty much back to her baseline.  We talked about options.  She would like to avoid repeat neuroimaging for now.  I think that is okay as long as she does not have another episode.  If her symptoms worsen or this episode repeats she should go to the emergency room.  She will follow back up with Dr. Kathlene Cote office in a few weeks  I recommend scheduling with her PCP for recheck and reevaluation.  Check back with me as needed.   PDMP not reviewed this encounter. No orders of the defined types were placed in this encounter.  No orders of the defined types were placed in this encounter.    Discussed warning signs or symptoms. Please see discharge instructions. Patient expresses understanding.   The above documentation has been reviewed and is accurate and complete [name], M.D. Total encounter time 40 minutes including face-to-face time with the patient and, reviewing past medical record, and charting on the date of service.

## 2022-11-04 ENCOUNTER — Encounter: Payer: Self-pay | Admitting: Physician Assistant

## 2022-11-04 ENCOUNTER — Ambulatory Visit (INDEPENDENT_AMBULATORY_CARE_PROVIDER_SITE_OTHER)
Admission: RE | Admit: 2022-11-04 | Discharge: 2022-11-04 | Disposition: A | Discharge status: Home / Self Care | Payer: Medicare Other | Source: Ambulatory Visit | Attending: Physician Assistant | Admitting: Physician Assistant

## 2022-11-04 ENCOUNTER — Ambulatory Visit (INDEPENDENT_AMBULATORY_CARE_PROVIDER_SITE_OTHER): Payer: Medicare Other | Admitting: Physician Assistant

## 2022-11-04 VITALS — BP 120/76 | HR 74 | Temp 97.3°F | Ht 60.0 in | Wt 131.4 lb

## 2022-11-04 DIAGNOSIS — R0781 Pleurodynia: Secondary | ICD-10-CM

## 2022-11-04 DIAGNOSIS — M1990 Unspecified osteoarthritis, unspecified site: Secondary | ICD-10-CM | POA: Diagnosis not present

## 2022-11-04 DIAGNOSIS — R413 Other amnesia: Secondary | ICD-10-CM | POA: Diagnosis not present

## 2022-11-04 DIAGNOSIS — R1310 Dysphagia, unspecified: Secondary | ICD-10-CM

## 2022-11-04 MED ORDER — PREDNISONE 20 MG PO TABS: ORAL_TABLET | ORAL | 0 refills | Status: DC

## 2022-11-04 NOTE — Patient Instructions (Signed)
It was great to see you!  I'm placing referral for a Neurocognitve Evaluation -- someone should reach out to you for this  HOLD your atorvastatin  Start 20 mg prednisone in am, may take two tablets if tolerated  Take for a few days to calm down this "flare"  An order for xray has been put in for you. To have this done, you can walk in at the [facility] location without a scheduled appointment.  The address is [address] It is across the street from [facility]. Lab and x-xray are located in the basement.   Hours of operation are M-F 8:30am to 5:00pm.  Please note that they are closed for lunch between 12:30 and 1:00pm.   Take care,  Jarold Motto PA-C

## 2022-11-04 NOTE — Progress Notes (Signed)
Heather Hodges is a 77 y.o. female here for a follow up of a pre-existing problem.  History of Present Illness:   Chief Complaint  Patient presents with   Joint Pain    Pt c/o joint pain all over.   Breast Pain    Pt c/o pain under left breast, stabbing pain.   Memory Loss    Pt would like to discuss.    HPI  Arthritis/Joint Pain: She complains of chronic joint pain and aches all over her body.  She states that this has been going on since her fall and concussion back in December. She states that the pain has worsened since getting injections in her neck.  She also complains of accompanying headaches and pains in her elbows, knees, and ankles.  Breast Pain: She counties to complain of pain under her left breast, over the ribs. She states that this area is constantly sore.   She would like imaging of this.  Memory Loss:  She complains of intermittent memory loss. She has episodes where she forgets either distant or recent information  She also complained of accompanying lose of balance that occurred about a week ago.  She  had ran out of her Gabapentin and was taking her husbands.  She is wondering if her previous fall and concussion has contributed to her symptoms.  She also reports having multiple episodes of muscle twitches and wonders if Gabapentin is contributing to that.  She also complains of accompanying sleep disturbance.  She has stopped taking Cymbalta 30 mg but has restarted Gabapentin 100 mg. She takes it both in the morning and at night. She is also taking Robaxin 500 mg along with Lipitor 80 mg. She reports that both her parents had dementia.    Ear Pain: She also complains of ear pain.  She states that she's unable to pop her right ear.   Dysphagia: She states that she had an episode of SOB where she wasn't able to catch her breath for about 5 minutes.  She's unable to swallow big or dry food as it will get stuck in her throat.  She also has difficulty  swallowing pills.   Past Medical History:  Diagnosis Date   Arthritis    Colon polyps    Diverticulosis    History of chicken pox    Hyperlipidemia    Hypothyroidism    Osteoporosis    Vitamin D deficiency      Social History   Tobacco Use   Smoking status: Never   Smokeless tobacco: Never  Substance Use Topics   Alcohol use: Never   Drug use: Never    Past Surgical History:  Procedure Laterality Date   BREAST BIOPSY Left    Benign   TONSILLECTOMY AND ADENOIDECTOMY      Family History  Problem Relation Age of Onset   Arthritis Mother    Breast cancer Mother        35s-70s   High Cholesterol Mother    Dementia Mother    Heart attack Father    High Cholesterol Father    Dementia Father    Arthritis Brother    Hearing loss Brother    Hypercholesterolemia Brother     Allergies  Allergen Reactions   Risedronate Sodium Other (See Comments)    TMJ dysfunction    Current Medications:   Current Outpatient Medications:    acetaminophen (TYLENOL 8 HOUR ARTHRITIS PAIN) 650 MG CR tablet, Take 650 mg by mouth every 8 (  eight) hours as needed for pain., Disp: , Rfl:    atorvastatin (LIPITOR) 80 MG tablet, Take 1 tablet (80 mg total) by mouth daily., Disp: 90 tablet, Rfl: 3   b complex vitamins capsule, Take 1 capsule by mouth every other day., Disp: , Rfl:    Calcium Carbonate-Vitamin D (CALCIUM 500+D PO), Take 2 each by mouth every other day., Disp: , Rfl:    gabapentin (NEURONTIN) 100 MG capsule, Take 2 capsules (200 mg total) by mouth 3 (three) times daily., Disp: 540 capsule, Rfl: 1   ketoconazole (NIZORAL) 2 % cream, Apply to affected area 1-2 times daily, Disp: 15 g, Rfl: 0   levothyroxine (SYNTHROID) 75 MCG tablet, Take 1 tablet (75 mcg total) by mouth every other day., Disp: 90 tablet, Rfl: 1   levothyroxine (SYNTHROID) 88 MCG tablet, Take 1 tablet (88 mcg total) by mouth every other day., Disp: 90 tablet, Rfl: 1   lidocaine (XYLOCAINE) 4 % external solution,  Apply topically., Disp: , Rfl:    Lidocaine 4 % PTCH, Place onto the skin., Disp: , Rfl:    methocarbamol (ROBAXIN) 500 MG tablet, Take 1 tablet (500 mg total) by mouth at bedtime as needed for muscle spasms., Disp: 90 tablet, Rfl: 1   Omega-3 Fatty Acids (OMEGA 3 500 PO), Take 1 capsule by mouth daily in the afternoon., Disp: , Rfl:    OVER THE COUNTER MEDICATION, Apply topically as needed. Hempvana, Disp: , Rfl:    predniSONE (DELTASONE) 20 MG tablet, Take one tablet daily, may increase to two tablets daily if needed, Disp: 12 tablet, Rfl: 0   SYSTANE ULTRA 0.4-0.3 % SOLN, SMARTSIG:1 Drop(s) In Eye(s) PRN, Disp: , Rfl:    Review of Systems:   Review of Systems  HENT:  Positive for hearing loss.   Eyes:  Positive for pain.  Respiratory:  Positive for shortness of breath.   Gastrointestinal:        +dysphagia   Musculoskeletal:  Positive for joint pain.       +breast pain   Neurological:  Positive for headaches.       +sleep disturbance   Psychiatric/Behavioral:  Positive for memory loss.     Vitals:   Vitals:   11/04/22 0802  BP: 120/76  Pulse: 74  Temp: (!) 97.3 F (36.3 C)  TempSrc: Temporal  SpO2: 95%  Weight: 131 lb 6.1 oz (59.6 kg)  Height: 5' (1.524 m)     Body mass index is 25.66 kg/m.  Physical Exam:   Physical Exam Constitutional:      Appearance: Normal appearance. She is well-developed.  HENT:     Head: Normocephalic and atraumatic.  Eyes:     General: Lids are normal.     Extraocular Movements: Extraocular movements intact.     Conjunctiva/sclera: Conjunctivae normal.  Pulmonary:     Effort: Pulmonary effort is normal.  Musculoskeletal:        General: Normal range of motion.     Cervical back: Normal range of motion and neck supple.  Skin:    General: Skin is warm and dry.  Neurological:     Mental Status: She is alert and oriented to person, place, and time.  Psychiatric:        Attention and Perception: Attention and perception normal.         Mood and Affect: Mood normal.        Behavior: Behavior normal.        Thought Content: Thought content normal.  Judgment: Judgment normal.     Assessment and Plan:   Rib pain on left side Ongoing chronic issue Will obtain xray per patient request Continue to closely monitor  She cannot tolerate physical therapy at this time Trial prednisone -- risks, benefits, side effect(s) discussed  Arthritis Ongoing chronic issue Seeing rheum next month, I'm hopeful she may get more options for pain relief and some explanations for her ongoing issues Trial prednisone -- risks, benefits, side effect(s) discussed  Memory loss Referral to [facility]  Dysphagia, unspecified type Suspect needs gastroenterology Recommend avoiding difficult to chew foods Follow-up if any new/worsening concerns  I,Safa M Kadhim,acting as a scribe for [employer], PA.,have documented all relevant documentation on the behalf of Jarold Motto, PA,as directed by  Jarold Motto, PA while in the presence of Jarold Motto, [place].  I, Jarold Motto, [place], have reviewed all documentation for this visit. The documentation on 11/04/22 for the exam, diagnosis, procedures, and orders are all accurate and complete.   Jarold Motto, PA-C

## 2022-11-06 ENCOUNTER — Telehealth: Payer: Self-pay | Admitting: Physician Assistant

## 2022-11-06 NOTE — Telephone Encounter (Signed)
Pt would like a call back with imaging results 

## 2022-11-07 NOTE — Telephone Encounter (Signed)
Spoke to pt told her results are not back yet, have not been read by Radiologist. We will be touch once Heather Hodges has reviewed result. Pt verbalized understanding.

## 2022-11-19 ENCOUNTER — Other Ambulatory Visit: Payer: Self-pay | Admitting: Physician Assistant

## 2022-11-19 MED ORDER — MELOXICAM 7.5 MG PO TABS: 7.5000 mg | ORAL_TABLET | Freq: Every day | ORAL | 0 refills | Status: DC

## 2022-11-19 NOTE — Addendum Note (Signed)
Addended by: Jimmye Norman on: 11/19/2022 04:34 PM   Modules accepted: Orders

## 2022-11-19 NOTE — Telephone Encounter (Signed)
Spoke to pt , asked her to hold her statin - has she done this? Pt said she has stopped. Do we think that this is playing a role with her symptom(s)? Pt said her symptoms have not changed. Told her okay, Lelon Mast said we can restart meloxicam 7.5 mg if she would like. Pt said yes. Told her I will send Rx now and Also Lelon Mast said, I'm hopeful that when she sees rheumatology they can help [place] figure out better pain management options. Pt verbalized understanding.

## 2022-11-19 NOTE — Telephone Encounter (Signed)
Please see message and advise 

## 2022-11-19 NOTE — Telephone Encounter (Signed)
Patient states prednisone did help with her symptoms but when she ran out of the medication, she was "back at square one". I offered pt an OV for further eval but she stated she didn't want to have to come back in. Please Advise.

## 2022-11-21 ENCOUNTER — Telehealth: Payer: Self-pay | Admitting: Physician Assistant

## 2022-11-21 NOTE — Telephone Encounter (Signed)
Prescription Request  11/21/2022  LOV: 11/04/2022  What is the name of the medication or equipment?  predniSONE (DELTASONE) 20 MG tablet    Have you contacted your pharmacy to request a refill?  No     Which pharmacy would you like this sent to? [id number] [place], [place] - [address] [place] [address] Phone: [phone] Fax: [phone]   Patient notified that their request is being sent to the clinical staff for review and that they should receive a response within 2 business days.   Please advise at Mobile [phone] (mobile)

## 2022-11-21 NOTE — Telephone Encounter (Signed)
Pt requesting refill for Prednisone. Please advise.

## 2022-11-24 MED ORDER — PREDNISONE 10 MG PO TABS: 10.0000 mg | ORAL_TABLET | Freq: Every day | ORAL | 0 refills | Status: DC

## 2022-11-24 NOTE — Telephone Encounter (Signed)
Left message on voicemail to call office.  

## 2022-11-24 NOTE — Telephone Encounter (Signed)
Left detailed message on personal voicemail per [facility], We can send in 10 mg prednisone daily x 1 week  Needs to hold mobic/aleve/ibuprofen while on this; ok to take tylenol  No further steroids after this as long term use is not safe.  I will send prednisone in now any questions please call the office.Rx sent.

## 2022-11-24 NOTE — Telephone Encounter (Signed)
Patient returned Donna's call. States she wanted medication sent in.

## 2022-11-27 ENCOUNTER — Encounter: Payer: Self-pay | Admitting: Physician Assistant

## 2022-11-27 ENCOUNTER — Ambulatory Visit (INDEPENDENT_AMBULATORY_CARE_PROVIDER_SITE_OTHER): Payer: Medicare Other | Admitting: Physician Assistant

## 2022-11-27 VITALS — BP 108/67 | HR 75 | Temp 98.2°F | Ht 60.0 in | Wt 135.2 lb

## 2022-11-27 DIAGNOSIS — R1310 Dysphagia, unspecified: Secondary | ICD-10-CM

## 2022-11-27 DIAGNOSIS — H9201 Otalgia, right ear: Secondary | ICD-10-CM | POA: Diagnosis not present

## 2022-11-27 DIAGNOSIS — M1990 Unspecified osteoarthritis, unspecified site: Secondary | ICD-10-CM | POA: Diagnosis not present

## 2022-11-27 DIAGNOSIS — R42 Dizziness and giddiness: Secondary | ICD-10-CM

## 2022-11-27 DIAGNOSIS — R0789 Other chest pain: Secondary | ICD-10-CM

## 2022-11-27 NOTE — Patient Instructions (Signed)
It was great to see you!  Please call gastroenterology for further evaluation  Try heating pads and topical diclofenac gel to your sternum  Consider dramamine drowsy and non-drowsy options for your symptoms  Take care,  Jarold Motto PA-C

## 2022-11-27 NOTE — Progress Notes (Signed)
Heather Hodges is a 77 y.o. female here for a follow up of a new problem.  History of Present Illness:   Chief Complaint  Patient presents with   medication    Pt states that she has started and stopped 2 meds and would like to discuss concerns.    Flu Vaccine    HPI  Ear Pain (Right): Reports she has been experiencing right-sided ear pain and tinnitus. States symptoms occurred after doing ablation treatment to the right side of her neck recently.  Sternal pain: Endorses pain on palpation, acute onset of sternum soreness diffuse posteriorly with pressure application, and soreness with inhalation/exhalation.  States she has been using Lidocaine 4% patches and heat to managed pain. Questioned possibility of using two patches when needed.   Arthritis: Discussed starting 10 mg Prednisone on 8/6 for arthralgia management.  Notes she tried 300 mg Gabapentin (and experienced jitters throughout the night).  Describes starting 50 mg Tramadol this week, sent in by her provider that did her neck ablation, and has been taking 25 mg in the mornings and 50 mg at night. Tolerating well.  Rates her recent pain overall 1-2/10; on Monday experienced 10/10 pain. Some mornings she feels improved, but other mornings worsened.   Dysphagia: Reports persistence of dysphagia to the point of choking.  States that she has not received a call from gastroenterology.   Dizziness: Reports persistence of dizziness.  States that she can't lean her head back at all or it will trigger dizzy episode.  Discussed using OTC dramamine to manage symptoms temporarily.   Past Medical History:  Diagnosis Date   Arthritis    Colon polyps    Diverticulosis    History of chicken pox    Hyperlipidemia    Hypothyroidism    Osteoporosis    Vitamin D deficiency      Social History   Tobacco Use   Smoking status: Never   Smokeless tobacco: Never  Substance Use Topics   Alcohol use: Never   Drug use: Never     Past Surgical History:  Procedure Laterality Date   BREAST BIOPSY Left    Benign   TONSILLECTOMY AND ADENOIDECTOMY      Family History  Problem Relation Age of Onset   Arthritis Mother    Breast cancer Mother        2s-70s   High Cholesterol Mother    Dementia Mother    Heart attack Father    High Cholesterol Father    Dementia Father    Arthritis Brother    Hearing loss Brother    Hypercholesterolemia Brother     Allergies  Allergen Reactions   Risedronate Sodium Other (See Comments)    TMJ dysfunction    Current Medications:   Current Outpatient Medications:    acetaminophen (TYLENOL 8 HOUR ARTHRITIS PAIN) 650 MG CR tablet, Take 650 mg by mouth every 8 (eight) hours as needed for pain., Disp: , Rfl:    b complex vitamins capsule, Take 1 capsule by mouth every other day., Disp: , Rfl:    Calcium Carbonate-Vitamin D (CALCIUM 500+D PO), Take 2 each by mouth every other day., Disp: , Rfl:    ketoconazole (NIZORAL) 2 % cream, Apply to affected area 1-2 times daily, Disp: 15 g, Rfl: 0   levothyroxine (SYNTHROID) 75 MCG tablet, Take 1 tablet (75 mcg total) by mouth every other day., Disp: 90 tablet, Rfl: 1   levothyroxine (SYNTHROID) 88 MCG tablet, Take 1 tablet (88  mcg total) by mouth every other day., Disp: 90 tablet, Rfl: 1   lidocaine (XYLOCAINE) 4 % external solution, Apply topically., Disp: , Rfl:    Lidocaine 4 % PTCH, Place onto the skin., Disp: , Rfl:    Omega-3 Fatty Acids (OMEGA 3 500 PO), Take 1 capsule by mouth daily in the afternoon., Disp: , Rfl:    OVER THE COUNTER MEDICATION, Apply topically as needed. Hempvana, Disp: , Rfl:    predniSONE (DELTASONE) 10 MG tablet, Take 1 tablet (10 mg total) by mouth daily with breakfast., Disp: 7 tablet, Rfl: 0   SYSTANE ULTRA 0.4-0.3 % SOLN, SMARTSIG:1 Drop(s) In Eye(s) PRN, Disp: , Rfl:    traMADol (ULTRAM) 50 MG tablet, Take 1 tablet by mouth 2 (two) times daily as needed., Disp: , Rfl:    atorvastatin (LIPITOR) 80  MG tablet, Take 1 tablet (80 mg total) by mouth daily. (Patient not taking: Reported on 11/27/2022), Disp: 90 tablet, Rfl: 3   gabapentin (NEURONTIN) 100 MG capsule, Take 2 capsules (200 mg total) by mouth 3 (three) times daily. (Patient not taking: Reported on 11/27/2022), Disp: 540 capsule, Rfl: 1   meloxicam (MOBIC) 7.5 MG tablet, Take 1 tablet (7.5 mg total) by mouth daily. (Patient not taking: Reported on 11/27/2022), Disp: 30 tablet, Rfl: 0   methocarbamol (ROBAXIN) 500 MG tablet, Take 1 tablet (500 mg total) by mouth at bedtime as needed for muscle spasms. (Patient not taking: Reported on 11/27/2022), Disp: 90 tablet, Rfl: 1   Review of Systems:   Review of Systems  HENT:  Positive for ear pain (right-sided) and tinnitus.        (+) Swelling (at injection site behind right ear) (+) Dysphagia (persistence of sx)  Musculoskeletal:        (+) Rib Pain (left-sided, worsened after specific movement; see HPI) (+) Sternum soreness (acute onset) (+) Arthralgia (see HPI)   Neurological:  Positive for dizziness (persisting; leaning head back triggers sx).    Vitals:   Vitals:   11/27/22 0951  BP: 108/67  Pulse: 75  Temp: 98.2 F (36.8 C)  TempSrc: Temporal  SpO2: 96%  Weight: 135 lb 3.2 oz (61.3 kg)  Height: 5' (1.524 m)     Body mass index is 26.4 kg/m.  Physical Exam:   Physical Exam Vitals and nursing note reviewed.  Constitutional:      General: She is not in acute distress.    Appearance: She is well-developed. She is not ill-appearing or toxic-appearing.  HENT:     Head: Normocephalic and atraumatic.     Right Ear: Tympanic membrane, ear canal and external ear normal. Tympanic membrane is not erythematous, retracted or bulging.     Left Ear: Tympanic membrane, ear canal and external ear normal. Tympanic membrane is not erythematous, retracted or bulging.     Nose: Nose normal.     Right Sinus: No maxillary sinus tenderness or frontal sinus tenderness.     Left Sinus: No  maxillary sinus tenderness or frontal sinus tenderness.     Mouth/Throat:     Pharynx: Uvula midline. No posterior oropharyngeal erythema.  Eyes:     General: Lids are normal.     Conjunctiva/sclera: Conjunctivae normal.  Neck:     Trachea: Trachea normal.  Cardiovascular:     Rate and Rhythm: Normal rate and regular rhythm.     Pulses: Normal pulses.     Heart sounds: Normal heart sounds, S1 normal and S2 normal.  Pulmonary:  Effort: Pulmonary effort is normal.     Breath sounds: Normal breath sounds. No decreased breath sounds, wheezing, rhonchi or rales.  Chest:     Comments: Tenderness to palpation to inferior aspect of sternum - no palpable abnormality Lymphadenopathy:     Cervical: No cervical adenopathy.  Skin:    General: Skin is warm and dry.  Neurological:     Mental Status: She is alert.     GCS: GCS eye subscore is 4. GCS verbal subscore is 5. GCS motor subscore is 6.  Psychiatric:        Speech: Speech normal.        Behavior: Behavior normal. Behavior is cooperative.     Assessment and Plan:   Right ear pain No obvious abnormalities Possible eustachian tube dysfunction?  Continue to monitor Consider ENT referral  Sternal pain No red flags on exam Recommend topical Voltaren gel Consider additional imaging, however she just had rib and chest xray earlier this month  Arthritis Ongoing Uncontrolled Tramadol per neurosurgery Continue efforts at exercises - cannot tolerate physical therapy Has new appointment with rheum in less than one month and I'm hoping that they can provide insight and help with other management options  Dysphagia, unspecified type Discussed that referral was placed at last visit Recommend to reach out to gastroenterology for further evaluation  Dizziness No red flags, has already had neurology evaluation and imaging She is unable to tolerate physical therapy  Will trial meclizine as needed while she works on better pain  management and range of motion of neck to allow [place] to hopefully pursue vestibular rehab again   [employer] as a scribe for [employer], PA.,have documented all relevant documentation on the behalf of Jarold Motto, PA,as directed by  Jarold Motto, PA while in the presence of Jarold Motto, [place].  I, Jarold Motto, [place], have reviewed all documentation for this visit. The documentation on 11/27/22 for the exam, diagnosis, procedures, and orders are all accurate and complete.  Jarold Motto, PA-C

## 2022-12-10 NOTE — Progress Notes (Signed)
Office Visit Note  Patient: Heather Hodges             Date of Birth: 1945/06/27           MRN: [id number]             PCP: Jarold Motto, PA Referring: Jarold Motto, PA Visit Date: 12/24/2022 Occupation: @GUAROCC @  Subjective:  Abnormal blood work and joint pain   History of Present Illness: Heather Hodges is a 77 y.o. female seen in consultation per request of her PCP for the evaluation of positive ANA.  According the patient in 2019 she fell from the [place] and had rib fractures and pneumothorax.  She had joint pain after that for a few months and then symptoms eased off.  She still have some thoracic discomfort from the previous rib fractures.  In December 2023 she had another fall when she fell forward and had brain concussion.  She has chronic numbness on the scalp from the head injury.  After that she started having increased aches and pains in multiple joints.  She has been experiencing pain and discomfort in her neck and lower back.  She has been followed by [facility] for pain management.  She also complains of discomfort in her shoulders, right elbow, right hand, right hip, right knee, right ankle and the bilateral feet.  She notices intermittent swelling in her right knee and right ankle joint.  There is no history of Achilles tendinitis or plantar fasciitis.  There is no history of psoriasis.  She had recent labs which showed positive ANA that is the reason she was referred to me.  She gives history of dry mouth and dry eyes.  She was diagnosed with fibromyalgia syndrome several years ago.  She has generalized pain and discomfort from fibromyalgia.  There is no history of malar rash, photosensitivity, lymphadenopathy, acute hair loss, Raynaud's phenomenon.  There is no family history of autoimmune disease.  She is gravida 1, para 1.  There is no history of preeclampsia or DVTs.    Activities of Daily Living:  Patient reports morning stiffness for anywhere between  a couple minutes to a few hours.   Patient Denies nocturnal pain.  Difficulty dressing/grooming: Denies Difficulty climbing stairs: Denies Difficulty getting out of chair: Denies Difficulty using hands for taps, buttons, cutlery, and/or writing: Reports  Review of Systems  Constitutional:  Positive for fatigue.  HENT:  Positive for mouth dryness. Negative for mouth sores.   Eyes:  Positive for dryness.  Respiratory:  Negative for shortness of breath.   Cardiovascular:  Negative for chest pain and palpitations.  Gastrointestinal:  Negative for blood in stool, constipation and diarrhea.  Endocrine: Negative for increased urination.  Genitourinary:  Positive for involuntary urination.  Musculoskeletal:  Positive for joint pain, gait problem, joint pain, joint swelling, myalgias, muscle weakness, morning stiffness, muscle tenderness and myalgias.  Skin:  Positive for sensitivity to sunlight. Negative for color change, rash and hair loss.  Allergic/Immunologic: Negative for susceptible to infections.  Neurological:  Positive for dizziness and headaches.  Psychiatric/Behavioral:  Positive for sleep disturbance. Negative for depressed mood. The patient is nervous/anxious.     PMFS History:  Patient Active Problem List   Diagnosis Date Noted   Fibromyalgia 02/11/2021   Chronic neck pain 02/11/2021   Osteoporosis    Hypothyroidism    Hyperlipidemia    Diverticulosis    Arthritis    Chronic pain syndrome 02/03/2018   Contusion of left lung  07/20/2017   Cerebral contusion (HCC) 07/17/2017   Traumatic pneumothorax 07/17/2017   Rib fractures 07/16/2017   Vitamin D deficiency 08/06/2015    Past Medical History:  Diagnosis Date   Arthritis    Colon polyps    Diverticulosis    History of chicken pox    Hyperlipidemia    Hypothyroidism    Osteoporosis    Vitamin D deficiency     Family History  Problem Relation Age of Onset   Arthritis Mother    Breast cancer Mother         42s-70s   High Cholesterol Mother    Dementia Mother    Heart attack Father    High Cholesterol Father    Dementia Father    Arthritis Brother    Hearing loss Brother    Hypercholesterolemia Brother    Past Surgical History:  Procedure Laterality Date   BREAST BIOPSY Left    Benign   CARPAL TUNNEL RELEASE Bilateral    TONSILLECTOMY AND ADENOIDECTOMY     TRIGGER FINGER RELEASE     Social History   Social History Narrative   Married   Retired -- Statistician and nanny   1 son -- lives in [place]   Immunization History  Administered Date(s) Administered   Fluad Quad(high Dose 65+) 12/23/2019, 04/23/2021   Influenza, High Dose Seasonal PF 01/01/2015, 03/18/2016, 06/17/2017, 12/18/2017, 12/16/2018   PFIZER(Purple Top)SARS-COV-2 Vaccination 04/17/2018, 05/02/2019   Pneumococcal Conjugate-13 08/07/2015   Pneumococcal Polysaccharide-23 07/02/2009   Zoster Recombinant(Shingrix) 02/16/2019, 04/30/2019     Objective: Vital Signs: BP 108/68 (BP Location: Right Arm, Patient Position: Sitting, Cuff Size: Normal)   Pulse 66   Resp 14   Ht 5\' 1"  (1.549 m)   Wt 133 lb (60.3 kg)   BMI 25.13 kg/m    Physical Exam Vitals and nursing note reviewed.  Constitutional:      Appearance: She is well-developed.  HENT:     Head: Normocephalic and atraumatic.  Eyes:     Conjunctiva/sclera: Conjunctivae normal.  Cardiovascular:     Rate and Rhythm: Normal rate and regular rhythm.     Heart sounds: Normal heart sounds.  Pulmonary:     Effort: Pulmonary effort is normal.     Breath sounds: Normal breath sounds.  Abdominal:     General: Bowel sounds are normal.     Palpations: Abdomen is soft.  Musculoskeletal:     Cervical back: Normal range of motion.  Lymphadenopathy:     Cervical: No cervical adenopathy.  Skin:    General: Skin is warm and dry.     Capillary Refill: Capillary refill takes less than 2 seconds.  Neurological:     Mental Status: She is alert and oriented to  person, place, and time.  Psychiatric:        Behavior: Behavior normal.      Musculoskeletal Exam: Patient had limited lateral rotation, flexion and extension of the cervical spine.  She had thoracic kyphosis.  She had discomfort in the lower lumbar region with decreased range of motion.  Shoulder joint abduction was limited to 90 degrees bilaterally due to neck pain.  Elbow joints and wrist joints in good range of motion.  She had bilateral PIP and DIP thickening with no synovitis.  Hip joints and knee joints were in good range of motion.  She had thickening of bilateral knee joints without any warmth swelling or effusion.  There was no tenderness over ankles or MTPs.  Prominence of  bilateral first MTP PIP and DIP joints was noted.  CDAI Exam: CDAI Score: -- Patient Global: --; Provider Global: -- Swollen: --; Tender: -- Joint Exam 12/24/2022   No joint exam has been documented for this visit   There is currently no information documented on the homunculus. Go to the Rheumatology activity and complete the homunculus joint exam.  Investigation: No additional findings.  Imaging: No results found.  Recent Labs: Lab Results  Component Value Date   WBC 6.8 08/02/2022   HGB 11.4 (L) 08/02/2022   PLT 253 08/02/2022   NA 136 08/02/2022   K 4.4 08/02/2022   CL 102 08/02/2022   CO2 27 08/02/2022   GLUCOSE 79 08/02/2022   BUN 13 08/02/2022   CREATININE 0.75 08/02/2022   BILITOT 0.4 08/02/2022   ALKPHOS 55 08/02/2022   AST 17 08/02/2022   ALT 16 08/02/2022   PROT 6.5 08/13/2022   ALBUMIN 3.9 08/02/2022   CALCIUM 9.1 08/13/2022   03/15 /2024 SPEP normal, iron studies normal, PTH 46, calcium 9.1  September 01, 2022 ANA 1: 320 NS, RF negative, anti-CCP negative, ESR 10, CRP<1.0  December 29, 2019 T-score -2.3 left femoral neck.  Speciality Comments: No specialty comments available.  Procedures:  No procedures performed Allergies: Risedronate sodium   Assessment / Plan:      Visit Diagnoses: Polyarthralgia-patient complains of pain and discomfort in multiple joints over the years.  The pain has been worse in the last 1 year since she  had a fall.  Positive ANA (antinuclear antibody) -patient was found to have positive ANA.  Patient was referred to me for the evaluation of positive ANA.  She denies any history of oral ulcers, nasal ulcers, malar rash, photosensitivity, Raynaud's, lymphadenopathy, acute hair loss.  She gives history of dry mouth and dry eyes.  Plan: RNP Antibody, Anti-Smith antibody, Sjogrens syndrome-A extractable nuclear antibody, Sjogrens syndrome-B extractable nuclear antibody, Anti-DNA antibody, double-stranded, C3 and C4, ANA  Chronic pain of both knees -patient complains of pain and discomfort in her bilateral knee joints for many years which is getting worse gradually.  No warmth swelling or effusion was noted.  Plan: XR KNEE 3 VIEW RIGHT, XR KNEE 3 VIEW LEFT.  X-rays showed chondromalacia patella bilaterally.  Pain in both hands -she comes as of pain and discomfort in the bilateral hands.  Bilateral PIP and DIP thickening with no synovitis was noted.  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  Pain in both feet -she complains of discomfort in her bilateral feet.  No swelling was noted.  Bilateral first MTP thickening and PIP and DIP prominence was noted.  Plan: XR Foot 2 Views Right, XR Foot 2 Views Left, x-rays showed osteoarthritic changes in bilateral feet.  DDD (degenerative disc disease), cervical -patient gives history of chronic neck pain.  She had limited painful range of motion.  She has been followed at [facility].  She has status post radiofrequency ablation.  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  She had limited painful range of motion of lumbar spine.  Age-related osteoporosis without current  pathological fracture - December 12, 2019 T-score -2.3 left femoral neck.  Patient states she takes calcium and vitamin D.  She is not taking any bisphosphonates or any other medications for treatment of osteopenia or osteoporosis.  Vitamin D deficiency  Other medical problems are listed as follows:  Traumatic  pneumothorax, sequela - 2019 after a fall from a ladder.  Closed fracture of multiple ribs of left side, sequela - 2019 fall  Other dysphagia-according to patient's husband she has an appointment coming up with the gastroenterologist.   Chronic pain syndrome-followed at neurosurgery.  Fibromyalgia -she gives history of generalized pain and discomfort from fibromyalgia.  She has had symptoms of fibromyalgia for many years.  She is on gabapentin.  Treated with Cymbalta in the past.  Diverticulosis  Mixed hyperlipidemia  Acquired hypothyroidism  Orders: Orders Placed This Encounter  Procedures   XR Foot 2 Views Right   XR Foot 2 Views Left   XR KNEE 3 VIEW RIGHT   XR KNEE 3 VIEW LEFT   XR Hand 2 View Right   XR Hand 2 View Left   RNP Antibody   Anti-Smith antibody   Sjogrens syndrome-A extractable nuclear antibody   Sjogrens syndrome-B extractable nuclear antibody   Anti-DNA antibody, double-stranded   C3 and C4   ANA   No orders of the defined types were placed in this encounter.    Follow-Up Instructions: Return for Positive ANA, polyarthralgia.   Pollyann Savoy, MD  Note - This record has been created using Animal nutritionist.  Chart creation errors have been sought, but may not always  have been located. Such creation errors do not reflect on  the standard of medical care.

## 2022-12-24 ENCOUNTER — Ambulatory Visit: Payer: Medicare Other

## 2022-12-24 ENCOUNTER — Ambulatory Visit (INDEPENDENT_AMBULATORY_CARE_PROVIDER_SITE_OTHER): Payer: Medicare Other

## 2022-12-24 ENCOUNTER — Encounter: Payer: Self-pay | Admitting: Rheumatology

## 2022-12-24 ENCOUNTER — Ambulatory Visit: Payer: Medicare Other | Attending: Rheumatology | Admitting: Rheumatology

## 2022-12-24 VITALS — BP 108/68 | HR 66 | Resp 14 | Ht 61.0 in | Wt 133.0 lb

## 2022-12-24 DIAGNOSIS — M545 Low back pain, unspecified: Secondary | ICD-10-CM | POA: Diagnosis present

## 2022-12-24 DIAGNOSIS — R1319 Other dysphagia: Secondary | ICD-10-CM | POA: Diagnosis present

## 2022-12-24 DIAGNOSIS — M79671 Pain in right foot: Secondary | ICD-10-CM

## 2022-12-24 DIAGNOSIS — S270XXS Traumatic pneumothorax, sequela: Secondary | ICD-10-CM | POA: Insufficient documentation

## 2022-12-24 DIAGNOSIS — M25562 Pain in left knee: Secondary | ICD-10-CM

## 2022-12-24 DIAGNOSIS — M79641 Pain in right hand: Secondary | ICD-10-CM | POA: Insufficient documentation

## 2022-12-24 DIAGNOSIS — R768 Other specified abnormal immunological findings in serum: Secondary | ICD-10-CM | POA: Diagnosis present

## 2022-12-24 DIAGNOSIS — M79672 Pain in left foot: Secondary | ICD-10-CM

## 2022-12-24 DIAGNOSIS — E782 Mixed hyperlipidemia: Secondary | ICD-10-CM | POA: Diagnosis present

## 2022-12-24 DIAGNOSIS — E559 Vitamin D deficiency, unspecified: Secondary | ICD-10-CM | POA: Insufficient documentation

## 2022-12-24 DIAGNOSIS — M797 Fibromyalgia: Secondary | ICD-10-CM | POA: Diagnosis present

## 2022-12-24 DIAGNOSIS — M810 Age-related osteoporosis without current pathological fracture: Secondary | ICD-10-CM | POA: Diagnosis present

## 2022-12-24 DIAGNOSIS — M2550 Pain in unspecified joint: Secondary | ICD-10-CM | POA: Diagnosis present

## 2022-12-24 DIAGNOSIS — G894 Chronic pain syndrome: Secondary | ICD-10-CM | POA: Insufficient documentation

## 2022-12-24 DIAGNOSIS — M25561 Pain in right knee: Secondary | ICD-10-CM | POA: Diagnosis not present

## 2022-12-24 DIAGNOSIS — M5030 Other cervical disc degeneration, unspecified cervical region: Secondary | ICD-10-CM | POA: Insufficient documentation

## 2022-12-24 DIAGNOSIS — E039 Hypothyroidism, unspecified: Secondary | ICD-10-CM | POA: Diagnosis present

## 2022-12-24 DIAGNOSIS — S2242XS Multiple fractures of ribs, left side, sequela: Secondary | ICD-10-CM | POA: Diagnosis present

## 2022-12-24 DIAGNOSIS — K5790 Diverticulosis of intestine, part unspecified, without perforation or abscess without bleeding: Secondary | ICD-10-CM | POA: Diagnosis present

## 2022-12-24 DIAGNOSIS — M79642 Pain in left hand: Secondary | ICD-10-CM | POA: Insufficient documentation

## 2022-12-24 DIAGNOSIS — G8929 Other chronic pain: Secondary | ICD-10-CM

## 2022-12-24 DIAGNOSIS — S27321S Contusion of lung, unilateral, sequela: Secondary | ICD-10-CM

## 2022-12-25 LAB — C3 AND C4
C3 Complement: 131 mg/dL (ref 83–193)
C4 Complement: 23 mg/dL (ref 15–57)

## 2022-12-25 LAB — ANTI-DNA ANTIBODY, DOUBLE-STRANDED: ds DNA Ab: <1 IU/mL

## 2022-12-25 LAB — SJOGRENS SYNDROME-B EXTRACTABLE NUCLEAR ANTIBODY: SSB (La) (ENA) Antibody, IgG: <1 AI

## 2022-12-25 LAB — SJOGRENS SYNDROME-A EXTRACTABLE NUCLEAR ANTIBODY: SSA (Ro) (ENA) Antibody, IgG: 1.4 AI — AB

## 2022-12-25 LAB — ANTI-SMITH ANTIBODY: ENA SM Ab Ser-aCnc: <1 AI

## 2022-12-25 LAB — RNP ANTIBODY: Ribonucleic Protein(ENA) Antibody, IgG: <1 AI

## 2022-12-26 LAB — ANA: Anti Nuclear Antibody (ANA): POSITIVE — AB

## 2022-12-26 LAB — ANTI-NUCLEAR AB-TITER (ANA TITER): ANA Titer 1: 1:80 {titer} — ABNORMAL HIGH

## 2022-12-28 NOTE — Progress Notes (Signed)
ANA and SSA antibody are positive.  Rest of the labs are within normal limits.  I will discuss results at the follow-up visit.

## 2022-12-30 ENCOUNTER — Ambulatory Visit (INDEPENDENT_AMBULATORY_CARE_PROVIDER_SITE_OTHER): Payer: Medicare Other | Admitting: Physician Assistant

## 2022-12-30 ENCOUNTER — Encounter: Payer: Self-pay | Admitting: Physician Assistant

## 2022-12-30 VITALS — BP 130/80 | HR 74 | Temp 97.3°F | Ht 61.0 in | Wt 132.4 lb

## 2022-12-30 DIAGNOSIS — M272 Inflammatory conditions of jaws: Secondary | ICD-10-CM | POA: Diagnosis not present

## 2022-12-30 DIAGNOSIS — M25561 Pain in right knee: Secondary | ICD-10-CM | POA: Diagnosis not present

## 2022-12-30 DIAGNOSIS — G8929 Other chronic pain: Secondary | ICD-10-CM

## 2022-12-30 DIAGNOSIS — Z23 Encounter for immunization: Secondary | ICD-10-CM | POA: Diagnosis not present

## 2022-12-30 NOTE — Progress Notes (Unsigned)
   [name], PhD, LAT, ATC acting as a scribe for [name], MD.  Heather Hodges is a 77 y.o. female who presents to [facility] today for exacerbation of her R knee pain. Pt was last seen by Dr. Denyse Amass on 10/24/22 for post-head injury, associated MSK related pains.   Today, pt reports ***  R Knee swelling: Mechanical symptoms: Aggravates: Treatments tried:  Dx testing: 12/24/22 R knee XR & labs  03/11/22 R knee XR  11/14/20 R knee XR  Pertinent review of systems: ***  Relevant historical information: ***   Exam:  There were no vitals taken for this visit. General: Well Developed, well nourished, and in no acute distress.   MSK: ***    Lab and Radiology Results No results found for this or any previous visit (from the past 72 hour(s)). No results found.     Assessment and Plan: 77 y.o. female with ***   PDMP not reviewed this encounter. No orders of the defined types were placed in this encounter.  No orders of the defined types were placed in this encounter.    Discussed warning signs or symptoms. Please see discharge instructions. Patient expresses understanding.   ***

## 2022-12-30 NOTE — Progress Notes (Signed)
Heather Hodges is a 77 y.o. female here for a follow up of a pre-existing problem.  History of Present Illness:   Chief Complaint  Patient presents with   Rheumatology    Pt would like to discuss recent Rheumatology visit 9/25.    HPI  Accompanied by her husband today.  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   She is here today because Dr Corliss Skains said she needed to follow-up with someone regarding this abnormal finding. She is currently having intermittent knee pain and is unable to put any pressure on it. She is also experiencing accompanying swelling in her ankles and hip pain.  She had tried compression stocks band copper fit ut experienced discomfort with it. She denies having any orthopedic surgery. She is not interested in any surgical interventions.   Odontogenic Jaw infection  She is currently taking an antibiotic course per her dentist recommendation -- they did xrays and it showed infection Amoxicillin 500 mg was started yesterday. She was having jaw pain at that time. She denies any accompanying teeth infection    Past Medical History:  Diagnosis Date   Arthritis    Colon polyps    Diverticulosis    History of chicken pox    Hyperlipidemia    Hypothyroidism    Osteoporosis    Vitamin D deficiency      Social History   Tobacco Use   Smoking status: Never    Passive exposure: Past   Smokeless tobacco: Never  Vaping Use   Vaping status: Never Used  Substance Use Topics   Alcohol use: Not Currently   Drug use: Never    Past Surgical History:  Procedure Laterality Date   BREAST BIOPSY Left    Benign   CARPAL TUNNEL RELEASE Bilateral    TONSILLECTOMY AND ADENOIDECTOMY     TRIGGER FINGER RELEASE      Family History  Problem Relation Age of Onset   Arthritis Mother    Breast cancer  Mother        74s-70s   High Cholesterol Mother    Dementia Mother    Heart attack Father    High Cholesterol Father    Dementia Father    Arthritis Brother    Hearing loss Brother    Hypercholesterolemia Brother     Allergies  Allergen Reactions   Risedronate Sodium Other (See Comments)    TMJ dysfunction    Current Medications:   Current Outpatient Medications:    acetaminophen (TYLENOL 8 HOUR ARTHRITIS PAIN) 650 MG CR tablet, Take 650 mg by mouth every 8 (eight) hours as needed for pain., Disp: , Rfl:    amoxicillin (AMOXIL) 500 MG capsule, Take 500 mg by mouth 3 (three) times daily., Disp: , Rfl:    b complex vitamins capsule, Take 1 capsule by mouth every other day., Disp: , Rfl:    Calcium Carbonate-Vitamin D (CALCIUM 500+D PO), Take 2 each by mouth every other day., Disp: , Rfl:    dimenhyDRINATE (DRAMAMINE PO), Take by mouth., Disp: , Rfl:    ketoconazole (NIZORAL) 2 % cream, Apply to affected area 1-2 times daily, Disp: 15 g, Rfl: 0   levothyroxine (SYNTHROID) 75 MCG tablet, Take 1 tablet (75 mcg total) by mouth every other day., Disp: 90 tablet, Rfl: 1   levothyroxine (SYNTHROID) 88 MCG tablet, Take 1 tablet (  88 mcg total) by mouth every other day., Disp: 90 tablet, Rfl: 1   lidocaine (XYLOCAINE) 4 % external solution, Apply topically., Disp: , Rfl:    Lidocaine 4 % PTCH, Place onto the skin., Disp: , Rfl:    meloxicam (MOBIC) 7.5 MG tablet, Take 1 tablet (7.5 mg total) by mouth daily., Disp: 30 tablet, Rfl: 0   Omega-3 Fatty Acids (OMEGA 3 500 PO), Take 1 capsule by mouth daily in the afternoon., Disp: , Rfl:    OVER THE COUNTER MEDICATION, Apply topically as needed. Hempvana, Disp: , Rfl:    SYSTANE ULTRA 0.4-0.3 % SOLN, SMARTSIG:1 Drop(s) In Eye(s) PRN, Disp: , Rfl:    traMADol (ULTRAM) 50 MG tablet, Take 1 tablet by mouth 2 (two) times daily as needed., Disp: , Rfl:    atorvastatin (LIPITOR) 80 MG tablet, Take 1 tablet (80 mg total) by mouth daily. (Patient not  taking: Reported on 11/27/2022), Disp: 90 tablet, Rfl: 3   Review of Systems:   Review of Systems  Cardiovascular:  Positive for leg swelling.  Musculoskeletal:  Positive for joint pain.       +knee pain    Vitals:   Vitals:   12/30/22 0842  BP: 130/80  Pulse: 74  Temp: (!) 97.3 F (36.3 C)  TempSrc: Temporal  SpO2: 95%  Weight: 132 lb 6.1 oz (60 kg)  Height: 5\' 1"  (1.549 m)     Body mass index is 25.01 kg/m.  Physical Exam:   Physical Exam Vitals and nursing note reviewed.  Constitutional:      General: She is not in acute distress.    Appearance: She is well-developed. She is not ill-appearing or toxic-appearing.  Cardiovascular:     Rate and Rhythm: Normal rate and regular rhythm.     Pulses: Normal pulses.     Heart sounds: Normal heart sounds, S1 normal and S2 normal.  Pulmonary:     Effort: Pulmonary effort is normal.     Breath sounds: Normal breath sounds.  Skin:    General: Skin is warm and dry.  Neurological:     Mental Status: She is alert.     GCS: GCS eye subscore is 4. GCS verbal subscore is 5. GCS motor subscore is 6.  Psychiatric:        Speech: Speech normal.        Behavior: Behavior normal. Behavior is cooperative.     Assessment and Plan:   Odontogenic infection of jaw Denies any significant symptom(s) Unable to tell if symptom(s) improving yet as she just started medication She plans to follow-up with with dentist if no improvement or any changes  Need for immunization against influenza Flu shot provided today  Chronic pain of right knee Declined orthopedic referral I have secured an appointment for her tomorrow with Dr [name] to review films and discuss plan that she is comfortable with   Jarold Motto, PA-C   Ladona Mow M Kadhim,acting as a scribe for Jarold Motto, PA.,have documented all relevant documentation on the behalf of Jarold Motto, PA,as directed by  Jarold Motto, PA while in the presence of Jarold Motto, [place].   I, Jarold Motto, [place], have reviewed all documentation for this visit. The documentation on 12/30/22 for the exam, diagnosis, procedures, and orders are all accurate and complete.

## 2022-12-30 NOTE — Patient Instructions (Signed)
It was great to see you!  Please follow-up with Dr Denyse Amass regarding your knee  Take care,  Jarold Motto PA-C

## 2022-12-31 ENCOUNTER — Ambulatory Visit: Payer: Medicare Other | Admitting: Family Medicine

## 2022-12-31 ENCOUNTER — Other Ambulatory Visit: Payer: Self-pay

## 2022-12-31 ENCOUNTER — Encounter: Payer: Self-pay | Admitting: Family Medicine

## 2022-12-31 ENCOUNTER — Ambulatory Visit (INDEPENDENT_AMBULATORY_CARE_PROVIDER_SITE_OTHER): Payer: Medicare Other

## 2022-12-31 VITALS — BP 120/72 | HR 69 | Ht 61.0 in | Wt 120.0 lb

## 2022-12-31 DIAGNOSIS — M25571 Pain in right ankle and joints of right foot: Secondary | ICD-10-CM

## 2022-12-31 DIAGNOSIS — G8929 Other chronic pain: Secondary | ICD-10-CM

## 2022-12-31 DIAGNOSIS — M25561 Pain in right knee: Secondary | ICD-10-CM

## 2022-12-31 NOTE — Patient Instructions (Signed)
Thank you for coming in today.   Please get an Xray today before you leave   You received an injection today. Seek immediate medical attention if the joint becomes red, extremely painful, or is oozing fluid.   Check back some time in January (after you visit with rheumatology).

## 2023-01-12 NOTE — Progress Notes (Signed)
Office Visit Note  Patient: Heather Hodges             Date of Birth: 1945/07/28           MRN: [id number]             PCP: Jarold Motto, PA Referring: Jarold Motto, PA Visit Date: 01/26/2023 Occupation: @GUAROCC @  Subjective:  Pain in multiple joints  History of Present Illness: [name] is a 77 y.o. female with osteoarthritis and Sjogren's.  She returns today after her initial visit on December 24, 2022.  Patient states that she continues to have dry mouth and dry eye symptoms which are not very bothersome.  She has been using some eyedrops.  She continues to have discomfort in her shoulders, hands, hips, knees and her ankles.  She states she had right knee joint injection which was helpful.  She continues to have some generalized pain from fibromyalgia.  She denies any history of shortness of breath or palpitations.  She denies history of malar rash, photosensitivity, lymphadenopathy or Raynaud's phenomenon.    Activities of Daily Living:  Patient reports morning stiffness for various times day to day.  Patient Reports nocturnal pain.  Difficulty dressing/grooming: Denies Difficulty climbing stairs: Reports Difficulty getting out of chair: Denies Difficulty using hands for taps, buttons, cutlery, and/or writing: Denies  Review of Systems  Constitutional:  Positive for fatigue.  HENT:  Positive for mouth dryness. Negative for mouth sores.   Eyes:  Positive for dryness.  Respiratory:  Negative for shortness of breath.   Cardiovascular:  Positive for chest pain. Negative for palpitations.  Gastrointestinal:  Negative for blood in stool, constipation and diarrhea.  Endocrine: Negative for increased urination.  Genitourinary:  Negative for involuntary urination.  Musculoskeletal:  Positive for joint pain, gait problem, joint pain, joint swelling, myalgias, muscle weakness, morning stiffness, muscle tenderness and myalgias.  Skin:  Positive for hair loss. Negative  for color change, rash and sensitivity to sunlight.  Allergic/Immunologic: Negative for susceptible to infections.  Neurological:  Positive for headaches. Negative for dizziness.  Hematological:  Positive for swollen glands.  Psychiatric/Behavioral:  Positive for sleep disturbance. Negative for depressed mood. The patient is not nervous/anxious.     PMFS History:  Patient Active Problem List   Diagnosis Date Noted   Right knee pain 12/31/2022   Fibromyalgia 02/11/2021   Chronic neck pain 02/11/2021   Osteoporosis    Hypothyroidism    Hyperlipidemia    Diverticulosis    Arthritis    Chronic pain syndrome 02/03/2018   Contusion of left lung 07/20/2017   Cerebral contusion (HCC) 07/17/2017   Traumatic pneumothorax 07/17/2017   Rib fractures 07/16/2017   Vitamin D deficiency 08/06/2015    Past Medical History:  Diagnosis Date   Arthritis    Colon polyps    Diverticulosis    History of chicken pox    Hyperlipidemia    Hypothyroidism    Osteoporosis    Vitamin D deficiency     Family History  Problem Relation Age of Onset   Arthritis Mother    Breast cancer Mother        62s-70s   High Cholesterol Mother    Dementia Mother    Heart attack Father    High Cholesterol Father    Dementia Father    Arthritis Brother    Hearing loss Brother    Hypercholesterolemia Brother    Heart Problems Son    Past Surgical History:  Procedure  Laterality Date   BREAST BIOPSY Left    Benign   CARPAL TUNNEL RELEASE Bilateral    TONSILLECTOMY AND ADENOIDECTOMY     TRIGGER FINGER RELEASE     Social History   Social History Narrative   Married   Retired -- Statistician and nanny   1 son -- lives in [place]   Immunization History  Administered Date(s) Administered   Fluad Quad(high Dose 65+) 12/23/2019, 04/23/2021   Fluad Trivalent(High Dose 65+) 12/30/2022   Influenza, High Dose Seasonal PF 01/01/2015, 03/18/2016, 06/17/2017, 12/18/2017, 12/16/2018   PFIZER(Purple  Top)SARS-COV-2 Vaccination 04/17/2018, 05/02/2019   Pneumococcal Conjugate-13 08/07/2015   Pneumococcal Polysaccharide-23 07/02/2009   Zoster Recombinant(Shingrix) 02/16/2019, 04/30/2019     Objective: Vital Signs: BP 122/72 (BP Location: Left Arm, Patient Position: Sitting, Cuff Size: Normal)   Pulse 76   Resp 14   Ht 5\' 1"  (1.549 m)   Wt 129 lb 9.6 oz (58.8 kg)   BMI 24.49 kg/m    Physical Exam Vitals and nursing note reviewed.  Constitutional:      Appearance: She is well-developed.  HENT:     Head: Normocephalic and atraumatic.  Eyes:     Conjunctiva/sclera: Conjunctivae normal.  Cardiovascular:     Rate and Rhythm: Normal rate and regular rhythm.     Heart sounds: Normal heart sounds.  Pulmonary:     Effort: Pulmonary effort is normal.     Breath sounds: Normal breath sounds.  Abdominal:     General: Bowel sounds are normal.     Palpations: Abdomen is soft.  Musculoskeletal:     Cervical back: Normal range of motion.  Lymphadenopathy:     Cervical: No cervical adenopathy.  Skin:    General: Skin is warm and dry.     Capillary Refill: Capillary refill takes less than 2 seconds.  Neurological:     Mental Status: She is alert and oriented to person, place, and time.  Psychiatric:        Behavior: Behavior normal.      Musculoskeletal Exam: Patient had limited lateral rotation, flexion and extension of the cervical spine thoracic kyphosis was noted.  She had discomfort with range of motion of the lumbar spine.  Shoulders had 90 degree abduction bilaterally.  Elbow joints and wrist joints with good range of motion.  PIP and DIP thickening with no synovitis was noted.  Hip joints and knee joints were in good range of motion.  No swelling was noted over ankles or MTPs.  She had generalized hyperalgesia and tender points.  CDAI Exam: CDAI Score: -- Patient Global: --; Provider Global: -- Swollen: --; Tender: -- Joint Exam 01/26/2023   No joint exam has been  documented for this visit   There is currently no information documented on the homunculus. Go to the Rheumatology activity and complete the homunculus joint exam.  Investigation: No additional findings.  Imaging: DG Ankle Complete Right  Result Date: 01/18/2023 CLINICAL DATA:  right ankle pain EXAM: RIGHT ANKLE - COMPLETE 3+ VIEW COMPARISON:  12/24/2022 FINDINGS: There is no evidence of fracture, dislocation, or joint effusion. Bones are osteopenic. No joint abnormality. Small plantar calcaneal spur. There is no evidence of arthropathy or other focal bone abnormality. Soft tissues are unremarkable. IMPRESSION: Osteopenia. No acute finding by plain radiography. Electronically Signed   By: Judie Petit.  Shick M.D.   On: 01/18/2023 20:16   [place] LIMITED JOINT SPACE STRUCTURES LOW RIGHT(NO LINKED CHARGES)  Result Date: 01/06/2023 Procedure: Real-time Ultrasound Guided Injection  of right knee joint superior lateral patella space Device: Philips Affiniti 50G/GE Logiq Images permanently stored and available for review in PACS Verbal informed consent obtained.  Discussed risks and benefits of procedure. Warned about infection, bleeding, hyperglycemia damage to structures among others. Patient expresses understanding and agreement Time-out conducted.  Noted no overlying erythema, induration, or other signs of local infection.  Skin prepped in a sterile fashion.  Local anesthesia: Topical Ethyl chloride.  With sterile technique and under real time ultrasound guidance: 40 mg of Kenalog and 2 mL of Marcaine injected into knee joint. Fluid seen entering the joint capsule.  Completed without difficulty  Pain immediately resolved suggesting accurate placement of the medication.  Advised to call if fevers/chills, erythema, induration, drainage, or persistent bleeding.  Images permanently stored and available for review in the ultrasound unit. Impression: Technically successful ultrasound guided injection.     Recent Labs: Lab  Results  Component Value Date   WBC 6.8 08/02/2022   HGB 11.4 (L) 08/02/2022   PLT 253 08/02/2022   NA 136 08/02/2022   K 4.4 08/02/2022   CL 102 08/02/2022   CO2 27 08/02/2022   GLUCOSE 79 08/02/2022   BUN 13 08/02/2022   CREATININE 0.75 08/02/2022   BILITOT 0.4 08/02/2022   ALKPHOS 55 08/02/2022   AST 17 08/02/2022   ALT 16 08/02/2022   PROT 6.5 08/13/2022   ALBUMIN 3.9 08/02/2022   CALCIUM 9.1 08/13/2022   December 24, 2022 ANA 1: 80 NS, SSA positive, SSB negative, dsDNA negative, Smith negative, RNP negative, C3-C4 normal   03/15 /2024 SPEP normal, iron studies normal, PTH 46, calcium 9.1   September 01, 2022 ANA 1: 320 NS, RF negative, anti-CCP negative, ESR 10, CRP<1.0  Speciality Comments: No specialty comments available.  Procedures:  No procedures performed Allergies: Risedronate sodium   Assessment / Plan:     Visit Diagnoses: Sjogren's syndrome with keratoconjunctivitis sicca- +ANA, +Ro, RF negative, dry mouth and dry eyes.  Patient states dry mouth and dry eye symptoms are manageable.  She has been using over-the-counter eyedrops.  Detailed counsel regarding Sjogren's was provided.  Over-the-counter products were discussed at length.  Increased association of ILD and lymphoma with Sjogren's was discussed.  Patient denies any shortness of breath or palpitations.  She has had EKG in the past.  I discussed possible option of pilocarpine.  Patient does not see any need for pilocarpine.  She will try over-the-counter products for now.  I advised her to contact us in case she develops any new symptoms.  Polyarthralgia-she continues to have pain and discomfort in multiple joints.  No synovitis was noted.  Chondromalacia of both patellae - History of pain in multiple joints for the last 1 year.  X-rays obtained at the last visit showed chondromalacia patella.  X-ray findings were reviewed with the patient.  Patient states she had right knee joint injection by sports medicine  which was helpful.  Lower extremity muscle strengthening was discussed.  Primary osteoarthritis of both hands-she complains of discomfort in the bilateral hands.  X-rays obtained showed osteoarthritic changes.  X-ray findings were reviewed.  Joint protection muscle strengthening was discussed.  Pain in both feet-she complains of discomfort in the bilateral feet.  X-rays obtained at the last visit showed osteoarthritic changes.  X-ray findings were reviewed with the patient.  Proper fitting shoes were advised.  DDD (degenerative disc disease), cervical -she continues to have discomfort and limited range of motion in the cervical spine.  Status post  radiofrequency ablation.  Followed at [facility] neurosurgery.  Chronic midline low back pain without sciatica -she has chronic lower back pain.  Followed by neurosurgery.  Age-related osteoporosis without current pathological fracture - December 12, 2019 T-score -2.3 left femoral neck.  Use of calcium rich diet and vitamin D was advised.  Patient is currently not on any treatment.  Other medical problems listed as follows:  Vitamin D deficiency  Chronic pain syndrome  Fibromyalgia  Closed fracture of multiple ribs of left side, sequela  Traumatic pneumothorax, sequela  Diverticulosis  Mixed hyperlipidemia  Acquired hypothyroidism  Orders: No orders of the defined types were placed in this encounter.  No orders of the defined types were placed in this encounter.    Follow-Up Instructions: Return in about 1 year (around 01/26/2024) for Sjogren's.   Pollyann Savoy, MD  Note - This record has been created using Animal nutritionist.  Chart creation errors have been sought, but may not always  have been located. Such creation errors do not reflect on  the standard of medical care.

## 2023-01-19 NOTE — Progress Notes (Signed)
Right ankle x-ray shows no fractures.  No severe arthritis.

## 2023-01-26 ENCOUNTER — Ambulatory Visit: Payer: Medicare Other | Attending: Rheumatology | Admitting: Rheumatology

## 2023-01-26 ENCOUNTER — Encounter: Payer: Self-pay | Admitting: Rheumatology

## 2023-01-26 VITALS — BP 122/72 | HR 76 | Resp 14 | Ht 61.0 in | Wt 129.6 lb

## 2023-01-26 DIAGNOSIS — M545 Low back pain, unspecified: Secondary | ICD-10-CM | POA: Diagnosis present

## 2023-01-26 DIAGNOSIS — G894 Chronic pain syndrome: Secondary | ICD-10-CM | POA: Diagnosis present

## 2023-01-26 DIAGNOSIS — R768 Other specified abnormal immunological findings in serum: Secondary | ICD-10-CM

## 2023-01-26 DIAGNOSIS — M79671 Pain in right foot: Secondary | ICD-10-CM | POA: Diagnosis present

## 2023-01-26 DIAGNOSIS — M5030 Other cervical disc degeneration, unspecified cervical region: Secondary | ICD-10-CM | POA: Diagnosis present

## 2023-01-26 DIAGNOSIS — M19042 Primary osteoarthritis, left hand: Secondary | ICD-10-CM | POA: Diagnosis present

## 2023-01-26 DIAGNOSIS — M2241 Chondromalacia patellae, right knee: Secondary | ICD-10-CM | POA: Insufficient documentation

## 2023-01-26 DIAGNOSIS — M797 Fibromyalgia: Secondary | ICD-10-CM | POA: Diagnosis present

## 2023-01-26 DIAGNOSIS — S270XXS Traumatic pneumothorax, sequela: Secondary | ICD-10-CM | POA: Diagnosis present

## 2023-01-26 DIAGNOSIS — M3501 Sicca syndrome with keratoconjunctivitis: Secondary | ICD-10-CM | POA: Diagnosis not present

## 2023-01-26 DIAGNOSIS — G8929 Other chronic pain: Secondary | ICD-10-CM | POA: Insufficient documentation

## 2023-01-26 DIAGNOSIS — E782 Mixed hyperlipidemia: Secondary | ICD-10-CM | POA: Diagnosis present

## 2023-01-26 DIAGNOSIS — E559 Vitamin D deficiency, unspecified: Secondary | ICD-10-CM

## 2023-01-26 DIAGNOSIS — M79672 Pain in left foot: Secondary | ICD-10-CM | POA: Diagnosis present

## 2023-01-26 DIAGNOSIS — M2550 Pain in unspecified joint: Secondary | ICD-10-CM | POA: Diagnosis present

## 2023-01-26 DIAGNOSIS — E039 Hypothyroidism, unspecified: Secondary | ICD-10-CM | POA: Diagnosis present

## 2023-01-26 DIAGNOSIS — M19041 Primary osteoarthritis, right hand: Secondary | ICD-10-CM

## 2023-01-26 DIAGNOSIS — K5790 Diverticulosis of intestine, part unspecified, without perforation or abscess without bleeding: Secondary | ICD-10-CM

## 2023-01-26 DIAGNOSIS — M810 Age-related osteoporosis without current pathological fracture: Secondary | ICD-10-CM

## 2023-01-26 DIAGNOSIS — M2242 Chondromalacia patellae, left knee: Secondary | ICD-10-CM

## 2023-01-26 DIAGNOSIS — S2242XS Multiple fractures of ribs, left side, sequela: Secondary | ICD-10-CM | POA: Diagnosis present

## 2023-01-26 NOTE — Patient Instructions (Addendum)
Sjogren's Syndrome Sjgren's syndrome is an inflammatory disease in which the body's disease-fighting system (immune system) attacks the glands that produce tears (lacrimal glands) and the glands that produce saliva (salivary glands). This makes the eyes and mouth very dry. Sjgren's syndrome can also affect other parts of the body, causing dryness of the skin, nose, throat, and vagina. Sjgren's syndrome is a long-term (chronic) disorder that has no cure. In some cases, it is linked to other disorders (rheumatic disorders), such as rheumatoid arthritis and systemic lupus erythematosus (SLE). It may affect other parts of the body, such as the: Blood vessels. Joints. Lungs. Kidneys. Liver or pancreas. Brain, nerves, or spinal cord. What are the causes? The cause of this condition is not known. It may be passed along from parent to child (inherited), or it may be a symptom of a rheumatic disorder. What increases the risk? This condition is more likely to develop in: Women. People who are 2-56 years old and older. People who have recently had a viral infection or currently have a viral infection. What are the signs or symptoms? The main symptoms of this condition are: Dry mouth. This may include: A chalky feeling. Difficulty swallowing, speaking, or tasting. Frequent cavities in the teeth. Frequent mouth infections. Dry eyes. This may include: Burning, redness, and itching. Blurry vision. Fluctuating vision. Light sensitivity. Other symptoms may include: Dryness of the skin and the inside of the nose. Eyelid infections. Vaginal dryness (if applicable). Joint pain and stiffness. Muscle pain and stiffness. How is this diagnosed? This condition is diagnosed based on: Your symptoms. Your medical history. A physical exam of your eyes and mouth. Tests, including: A Schirmer test. This tests your tear production. An eye exam that is done with a magnifying device (slit-lamp exam). An  eye test that temporarily stains your eye with special dyes. This shows the extent of eye damage. Tests to check your salivary gland function. Biopsy. This is a removal of part of a salivary gland from inside your lower lip to be studied under a microscope. Chest X-rays. Blood or urine tests. How is this treated? There is no cure for this condition, but treatment can help you manage your symptoms. You may be asked to see a rheumatologist for further evaluation and treatment. This condition may be treated with: Medicines to help relieve pain and stiffness. Medicines to help relieve inflammation in your body (corticosteroids). These are usually for severe cases. Medicines to help reduce the activity of your immune system (immunosuppressants). These are usually prescribed by your health care provider or a rheumatologist. Moisture replacement therapies to help relieve dryness in your skin, mouth, and eyes. Dry eyes may be treated with: Eye drops or nasal sprays to improve dryness of the eyes. Surgery or insertion of plugs to close the lacrimal glands (punctal occlusion). This helps keep more natural tears in your eyes. Soft contact lenses or hard scleral lenses. These are occasionally used to protect the surface of the eye. Biologic lubricating eye drops (serum tears). These are eye drops made from a person's own blood. They are used in some people with severe dry eye. Follow these instructions at home: Eye care  Use eye drops and other medicines as told by your health care provider. Protect your eyes from the sun and wind with sunglasses or glasses. Blink at least 5-6 times a minute. Maintain properly humidified air. You may want to use a humidifier at home and at work. Avoid smoke. Mouth care Brush your teeth and floss  after every meal. Chew sugar-free gum or suck on hard candy. This may help to relieve dry mouth. Use antimicrobial mouthwash daily. Take frequent sips of water or sugar-free  drinks. Use saliva substitutes or lip balm as told by your health care provider. See your dentist every 6 months. General instructions  Take over-the-counter and prescription medicines only as told by your health care provider. Drink enough fluid to keep your urine pale yellow. Keep all follow-up visits. This is important. Contact a health care provider if: You have a fever. You have night sweats. You are always tired. You have unexplained weight loss. You develop itchy skin. You have red patches on your skin. You have a lump or swelling on your neck. Get help right away if: You develop severe eye pain. You develop sudden decreased vision. Summary Sjgren's syndrome is a disease in which the body's immune system attacks the glands that produce tears and the glands that produce saliva. This condition makes the eyes and mouth very dry. Sjgren's syndrome is a long-term (chronic) disorder. There is no cure for this condition, but treatment can help you manage your symptoms. The cause of this condition is not known. You may be asked to see a rheumatologist for further evaluation and treatment. This information is not intended to replace advice given to you by your health care provider. Make sure you discuss any questions you have with your health care provider. Document Revised: 10/15/2020 Document Reviewed: 10/15/2020 Elsevier Patient Education  2024 [employer]   OSTEOARTHRITIS   Osteoarthritis is a type of arthritis. It refers to joint pain or joint disease. Osteoarthritis affects tissue that covers the ends of bones in joints (cartilage). Cartilage acts as a cushion between the bones and helps them move smoothly. Osteoarthritis occurs when cartilage in the joints gets worn down. Osteoarthritis is sometimes called "wear and tear" arthritis. Osteoarthritis is the most common form of arthritis. It often occurs in older people. It is a condition that gets worse over time. The joints  most often affected by this condition are in the fingers, toes, hips, knees, and spine, including the neck and lower back. What are the causes? This condition is caused by the wearing down of cartilage that covers the ends of bones. What increases the risk? The following factors may make you more likely to develop this condition: Being age 44 or older. Obesity. Overuse of joints. Past injury of a joint. Past surgery on a joint. Family history of osteoarthritis. What are the signs or symptoms? The main symptoms of this condition are pain, swelling, and stiffness in the joint. Other symptoms may include: An enlarged joint. More pain and further damage caused by small pieces of bone or cartilage that break off and float inside of the joint. Small deposits of bone (osteophytes) that grow on the edges of the joint. A grating or scraping feeling inside the joint when you move it. Popping or creaking sounds when you move. Difficulty walking or exercising. An inability to grip items, twist your hand, or control the movements of your hands and fingers. How is this diagnosed? This condition may be diagnosed based on: Your medical history. A physical exam. Your symptoms. X-rays of the affected joints. Blood tests to rule out other types of arthritis. How is this treated? There is no cure for this condition, but treatment can help control pain and improve joint function. Treatment may include a combination of therapies, such as: Pain relief techniques, such as: Applying heat and cold  to the joint. Massage. A form of talk therapy called cognitive behavioral therapy (CBT). This therapy helps you set goals and follow up on the changes that you make. Medicines for pain and inflammation. The medicines can be taken by mouth or applied to the skin. They include: NSAIDs, such as ibuprofen. Prescription medicines. Strong anti-inflammatory medicines (corticosteroids). Certain nutritional  supplements. A prescribed exercise program. You may work with a physical therapist. Assistive devices, such as a brace, wrap, splint, specialized glove, or cane. A weight control plan. Surgery, such as: An osteotomy. This is done to reposition the bones and relieve pain or to remove loose pieces of bone and cartilage. Joint replacement surgery. You may need this surgery if you have advanced osteoarthritis. Follow these instructions at home: Activity Rest your affected joints as told by your health care provider. Exercise as told by your provider. The provider may recommend specific types of exercise, such as: Strengthening exercises. These are done to strengthen the muscles that support joints affected by arthritis. Aerobic activities. These are exercises, such as brisk walking or water aerobics, that increase your heart rate. Range-of-motion activities. These help your joints move more easily. Balance and agility exercises. Managing pain, stiffness, and swelling     If told, apply heat to the affected area as often as told by your provider. Use the heat source that your provider recommends, such as a moist heat pack or a heating pad. If you have a removable assistive device, remove it as told by your provider. Place a towel between your skin and the heat source. If your provider tells you to keep the assistive device on while you apply heat, place a towel between the assistive device and the heat source. Leave the heat on for 20-30 minutes. If told, put ice on the affected area. If you have a removable assistive device, remove it as told by your provider. Put ice in a plastic bag. Place a towel between your skin and the bag. If your provider tells you to keep the assistive device on during icing, place a towel between the assistive device and the bag. Leave the ice on for 20 minutes, 2-3 times a day. If your skin turns bright red, remove the ice or heat right away to prevent skin damage.  The risk of damage is higher if you cannot feel pain, heat, or cold. Move your fingers or toes often to reduce stiffness and swelling. Raise (elevate) the affected area above the level of your heart while you are sitting or lying down. General instructions Take over-the-counter and prescription medicines only as told by your provider. Maintain a healthy weight. Follow instructions from your provider for weight control. Do not use any products that contain nicotine or tobacco. These products include cigarettes, chewing tobacco, and vaping devices, such as e-cigarettes. If you need help quitting, ask your provider. Use assistive devices as told by your provider. Where to find more information [employer] of Arthritis and Musculoskeletal and Skin Diseases: niams.http://www.myers.net/ [employer] on Aging: BaseRingTones.pl American College of Rheumatology: rheumatology.org Contact a health care provider if: You have redness, swelling, or a feeling of warmth in a joint that gets worse. You have a fever along with joint or muscle aches. You develop a rash. You have trouble doing your normal activities. You have pain that gets worse and is not relieved by pain medicine. This information is not intended to replace advice given to you by your health care provider. Make sure you discuss any questions  you have with your health care provider. Document Revised: 11/14/2021 Document Reviewed: 11/14/2021 Elsevier Patient Education  2024 [employer].

## 2023-02-04 NOTE — Progress Notes (Unsigned)
   Heather Payor, PhD, LAT, ATC acting as a scribe for Heather Graham, MD.  Heather Hodges is a 77 y.o. female who presents to [facility] at [facility] today for f/u polyarthralgia after visit w/ rheumatology. Pt was last seen by Dr. Denyse Amass on 12/31/22 and was given a R knee steroid injection. She was seen by Dr. Corliss Skains on 10/28.  Today, pt reports ***  Pertinent review of systems: ***  Relevant historical information: ***   Exam:  There were no vitals taken for this visit. General: Well Developed, well nourished, and in no acute distress.   MSK: ***    Lab and Radiology Results No results found for this or any previous visit (from the past 72 hour(s)). No results found.     Assessment and Plan: 77 y.o. female with ***   PDMP not reviewed this encounter. No orders of the defined types were placed in this encounter.  No orders of the defined types were placed in this encounter.    Discussed warning signs or symptoms. Please see discharge instructions. Patient expresses understanding.   ***

## 2023-02-05 ENCOUNTER — Encounter: Payer: Self-pay | Admitting: Family Medicine

## 2023-02-05 ENCOUNTER — Ambulatory Visit: Payer: Medicare Other | Admitting: Family Medicine

## 2023-02-05 ENCOUNTER — Ambulatory Visit (INDEPENDENT_AMBULATORY_CARE_PROVIDER_SITE_OTHER)
Admission: RE | Admit: 2023-02-05 | Discharge: 2023-02-05 | Disposition: A | Discharge status: Home / Self Care | Payer: Medicare Other | Source: Ambulatory Visit | Attending: Family Medicine | Admitting: Family Medicine

## 2023-02-05 VITALS — BP 122/80 | HR 70 | Ht 61.0 in | Wt 131.0 lb

## 2023-02-05 DIAGNOSIS — Z87310 Personal history of (healed) osteoporosis fracture: Secondary | ICD-10-CM | POA: Diagnosis not present

## 2023-02-05 DIAGNOSIS — E559 Vitamin D deficiency, unspecified: Secondary | ICD-10-CM | POA: Diagnosis not present

## 2023-02-05 DIAGNOSIS — M810 Age-related osteoporosis without current pathological fracture: Secondary | ICD-10-CM

## 2023-02-05 LAB — VITAMIN D 25 HYDROXY (VIT D DEFICIENCY, FRACTURES): VITD: 31.42 ng/mL (ref 30.00–100.00)

## 2023-02-05 NOTE — Patient Instructions (Addendum)
Thank you for coming in today.   Please get labs today before you leave   Schedule DEXA scan at the front desk.   If we find the bone density is worsening I would recommend Fosamax

## 2023-02-06 ENCOUNTER — Telehealth: Payer: Self-pay | Admitting: Family Medicine

## 2023-02-06 NOTE — Telephone Encounter (Signed)
Spoke to pt to clarify. She just had her DEXA scan yesterday and we do not have the results yet, so she was not advised yet to start Fosamax. Instructed pt that she was advised to increase her Vitamin D intake to 2000iU and that we would wait to see what the DEXA scan showed before prescribing Fosamax. She was a bit confused, but I felt I had clarified Dr. Zollie Pee plan.

## 2023-02-06 NOTE — Progress Notes (Signed)
Vitamin D is starting to get a bit low.  Recommend taking 2000 units of vitamin D daily.  This is available over-the-counter as vitamin D3.

## 2023-02-06 NOTE — Telephone Encounter (Signed)
Pt states she recd a MyChart msg from Dr. Denyse Amass this morning regarding her Bone Density results. Per this msg, he recommended she start Fosamax.  Pt requesting we call in the Fosamax to [facility] asap, as she is leaving town this weekend.

## 2023-02-06 NOTE — Telephone Encounter (Signed)
Patient called back stating that the results of her Bone Density just came through and it stated that she does have osteoporosis.  She would like the Fosamax sent to [facility].

## 2023-02-10 MED ORDER — ALENDRONATE SODIUM 70 MG PO TABS: 70.0000 mg | ORAL_TABLET | ORAL | 11 refills | Status: DC

## 2023-02-10 NOTE — Progress Notes (Signed)
Bone density test shows osteoporosis.  The bone density is worsening compared to previous test.  I would recommend that we start the medicine Fosamax like we talked about.  Would you like me to do that now?

## 2023-02-10 NOTE — Addendum Note (Signed)
Addended by: Rodolph Bong on: 02/10/2023 07:20 AM   Modules accepted: Orders

## 2023-02-10 NOTE — Telephone Encounter (Signed)
Medication sent to [facility]

## 2023-02-11 NOTE — Telephone Encounter (Signed)
It is okay to use alendronate in this setting.  This is not a drug allergy.

## 2023-02-11 NOTE — Telephone Encounter (Signed)
Noted, form completed and faxed back to [facility].

## 2023-02-11 NOTE — Telephone Encounter (Signed)
ALERT received from pharmacy:  ALLERGY TO RISEDRONATE - There can be cross-sensitivity, please review and evaluate whether Alendronate would be appropriate.  TMJ dysfunction   Forwarding to Dr. Denyse Amass to review and advise.

## 2023-03-02 ENCOUNTER — Ambulatory Visit: Payer: Medicare Other | Admitting: Physician Assistant

## 2023-03-16 ENCOUNTER — Other Ambulatory Visit (INDEPENDENT_AMBULATORY_CARE_PROVIDER_SITE_OTHER): Payer: Medicare Other

## 2023-03-16 ENCOUNTER — Ambulatory Visit (INDEPENDENT_AMBULATORY_CARE_PROVIDER_SITE_OTHER): Payer: Medicare Other | Admitting: Nurse Practitioner

## 2023-03-16 ENCOUNTER — Encounter: Payer: Self-pay | Admitting: Nurse Practitioner

## 2023-03-16 VITALS — BP 106/62 | HR 72 | Ht 60.0 in | Wt 131.4 lb

## 2023-03-16 DIAGNOSIS — K219 Gastro-esophageal reflux disease without esophagitis: Secondary | ICD-10-CM | POA: Diagnosis not present

## 2023-03-16 DIAGNOSIS — K5900 Constipation, unspecified: Secondary | ICD-10-CM | POA: Diagnosis not present

## 2023-03-16 DIAGNOSIS — R1310 Dysphagia, unspecified: Secondary | ICD-10-CM | POA: Diagnosis not present

## 2023-03-16 DIAGNOSIS — D649 Anemia, unspecified: Secondary | ICD-10-CM | POA: Diagnosis not present

## 2023-03-16 DIAGNOSIS — M3500 Sicca syndrome, unspecified: Secondary | ICD-10-CM

## 2023-03-16 DIAGNOSIS — Z860100 Personal history of colon polyps, unspecified: Secondary | ICD-10-CM

## 2023-03-16 LAB — CBC
HCT: 37.5 % (ref 36.0–46.0)
Hemoglobin: 12.7 g/dL (ref 12.0–15.0)
MCHC: 33.9 g/dL (ref 30.0–36.0)
MCV: 94.4 fL (ref 78.0–100.0)
Platelets: 306 10*3/uL (ref 150.0–400.0)
RBC: 3.97 Mil/uL (ref 3.87–5.11)
RDW: 13.4 % (ref 11.5–15.5)
WBC: 7.8 10*3/uL (ref 4.0–10.5)

## 2023-03-16 LAB — BASIC METABOLIC PANEL
BUN: 9 mg/dL (ref 6–23)
CO2: 29 meq/L (ref 19–32)
Calcium: 8.6 mg/dL (ref 8.4–10.5)
Chloride: 104 meq/L (ref 96–112)
Creatinine, Ser: 0.66 mg/dL (ref 0.40–1.20)
GFR: 83.83 mL/min (ref >60.00)
Glucose, Bld: 89 mg/dL (ref 70–99)
Potassium: 4 meq/L (ref 3.5–5.1)
Sodium: 138 meq/L (ref 135–145)

## 2023-03-16 MED ORDER — FAMOTIDINE 20 MG PO TABS: 20.0000 mg | ORAL_TABLET | Freq: Every day | ORAL | 1 refills | Status: DC

## 2023-03-16 NOTE — Patient Instructions (Addendum)
You have been scheduled for a Barium Esophogram at [facility] (1st floor of the hospital) on 03/23/23 at 10:00 am. Please arrive 30 minutes prior to your appointment for registration. If you need to reschedule for any reason, please contact radiology at [phone] to do so.  This test typically takes about 30 minutes to perform. __________________________________________________________________________________  We have sent the following medications to your pharmacy for you to pick up at your convenience: Famotidine 20 mg  Benefiber- take 1 tablespoon daily as tolerated to regulate bowel pattern  Due to recent changes in healthcare laws, you may see the results of your imaging and laboratory studies on MyChart before your provider has had a chance to review them.  We understand that in some cases there may be results that are confusing or concerning to you. Not all laboratory results come back in the same time frame and the provider may be waiting for multiple results in order to interpret others.  Please give [place] 48 hours in order for your provider to thoroughly review all the results before contacting the office for clarification of your results.   Thank you for trusting me with your gastrointestinal care!   Alcide Evener, CRNP

## 2023-03-16 NOTE — Progress Notes (Signed)
+    03/16/2023 Heather Hodges [id number] 1946-03-09   CHIEF COMPLAINT: Difficulty swallowing   HISTORY OF PRESENT ILLNESS: Heather Hodges is a 77 year old female with a past medical history of osteoarthritis, osteoporosis, Sjogren's syndrome, hyperlipidemia, hypothyroidism, vitamin D deficiency, mechanical fall resulting in a traumatic pneumothorax 2019 and diverticulosis. She presents to our office today as referred by Jarold Motto PA-C for further evaluation regarding dysphagia. She is accompanied by her husband. She endorses having difficulty swallowing dry foods or unpeeled apples. She describes having food which gets stuck in her throat or upper esophagus which occurs several times weekly for the past 1 1/2 to 2 years. She sometimes drinks water and the stuck food goes down. She sometimes coughs and gags and the food goes down, does not come back up. She has heartburn every other day which is worse when she eats food away from home. Spicy and salty foods worsens her heartburn. She denies taking Meloxicam which was on her medication list. No NSAID use. She takes Tylenol arthritis PRN. Nonsmoker. No alcohol use.  She started taking Fosamax 1 month ago.  She typically passes a normal brown bowel movement every day or every other day.  She sometimes has mild constipation.  No rectal bleeding or black stools.  She underwent a colonoscopy 10/2015 at [facility] records, results unknown but patient denied having any complications from this procedure.  Patient denies having a history of colon polyps as documented in her prior PMH list.  She subsequently underwent a virtual colonoscopy 12/2015 which was negative for colon polyps.  No known family history of colon polyps or colorectal cancer.  Patient denies having any history of cardiac or pulmonary disease.      Latest Ref Rng & Units 08/02/2022   11:05 AM 09/16/2021   10:42 AM 04/23/2021    9:43 AM  CBC  WBC 4.0 - 10.5 K/uL 6.8  7.0   8.1   Hemoglobin 12.0 - 15.0 g/dL 27.0  35.0  09.3   Hematocrit 36.0 - 46.0 % 34.2  36.2  37.6   Platelets 150 - 400 K/uL 253  243.0  239.0         Latest Ref Rng & Units 08/13/2022    9:15 AM 08/13/2022    9:09 AM 08/02/2022   11:05 AM  CMP  Glucose 70 - 99 mg/dL   79   BUN 8 - 23 mg/dL   13   Creatinine 8.18 - 1.00 mg/dL   2.99   Sodium 371 - 696 mmol/L   136   Potassium 3.5 - 5.1 mmol/L   4.4   Chloride 98 - 111 mmol/L   102   CO2 22 - 32 mmol/L   27   Calcium 8.6 - 10.4 mg/dL  9.1  8.6   Total Protein 6.1 - 8.1 g/dL 6.5   6.3   Total Bilirubin 0.3 - 1.2 mg/dL   0.4   Alkaline Phos 38 - 126 U/L   55   AST 15 - 41 U/L   17   ALT 0 - 44 U/L   16    Iron 74. Iron saturation 26%. Ferritin 53.  B12 level 450.   Virtual colonoscopy 01/25/2016:  No suspicious polypoid lesions or annular constricting lesions.   Scattered diverticula.   Virtual colonoscopy is not designed to detect diminutive polyps  (i.e., less than or equal to 5 mm), the presence or absence of which  may not affect  clinical management.   CT ABDOMEN AND PELVIS WITHOUT CONTRAST   FINDINGS:   Lower chest: Lung bases are clear. No effusions. Heart is normal  size.   Hepatobiliary: Scattered hypodensities in the liver, likely small  cysts. No biliary duct dilatation. Gallbladder unremarkable.   Pancreas: No focal abnormality or ductal dilatation.   Spleen: No focal abnormality.  Normal size.   Adrenals/Urinary Tract: No adrenal abnormality. No focal renal  abnormality. No stones or hydronephrosis. Urinary bladder is  unremarkable.   Stomach/Bowel: Stomach and small bowel grossly unremarkable.   Vascular/Lymphatic: Scattered aortic calcifications. No aneurysm. No  adenopathy.   Reproductive: Uterus and adnexa unremarkable.  No mass.   Other: No free fluid or free air.   Musculoskeletal: No acute bony abnormality or focal bone lesion.  Degenerative facet disease in the lower lumbar spine.    IMPRESSION: No acute or significant abnormality in the abdomen or  pelvis.   Past Medical History:  Diagnosis Date   Arthritis    Colon polyps    Diverticulosis    History of chicken pox    Hyperlipidemia    Hypothyroidism    Osteoporosis    Vitamin D deficiency    Past Surgical History:  Procedure Laterality Date   BREAST BIOPSY Left    Benign   CARPAL TUNNEL RELEASE Bilateral    TONSILLECTOMY AND ADENOIDECTOMY     TRIGGER FINGER RELEASE     Social History: She is married.  She has 1 son.  She is a retired Manufacturing systems engineer.  Non-smoker.  No alcohol use.  No drug use.  Family History: family history includes Arthritis in her brother and mother; Breast cancer in her mother; Dementia in her father and mother; Hearing loss in her brother; Heart Problems in her son; Heart attack in her father; High Cholesterol in her father and mother; Hypercholesterolemia in her brother.  No known family history of esophageal, gastric or colorectal cancer.   Allergies  Allergen Reactions   Risedronate Sodium Other (See Comments)    TMJ dysfunction      Outpatient Encounter Medications as of 03/16/2023  Medication Sig   acetaminophen (TYLENOL 8 HOUR ARTHRITIS PAIN) 650 MG CR tablet Take 650 mg by mouth every 8 (eight) hours as needed for pain.   alendronate (FOSAMAX) 70 MG tablet Take 1 tablet (70 mg total) by mouth every 7 (seven) days.   amoxicillin (AMOXIL) 500 MG capsule Take 500 mg by mouth 3 (three) times daily.   atorvastatin (LIPITOR) 80 MG tablet Take 1 tablet (80 mg total) by mouth daily.   b complex vitamins capsule Take 1 capsule by mouth every other day.   Calcium Carbonate-Vitamin D (CALCIUM 500+D PO) Take 2 each by mouth every other day.   dimenhyDRINATE (DRAMAMINE PO) Take by mouth as needed.   ketoconazole (NIZORAL) 2 % cream Apply to affected area 1-2 times daily (Patient taking differently: as needed. Apply to affected area 1-2 times daily)   levothyroxine (SYNTHROID)  75 MCG tablet Take 1 tablet (75 mcg total) by mouth every other day.   levothyroxine (SYNTHROID) 88 MCG tablet Take 1 tablet (88 mcg total) by mouth every other day.   lidocaine (XYLOCAINE) 4 % external solution Apply topically.   Lidocaine 4 % PTCH Place onto the skin.   meloxicam (MOBIC) 7.5 MG tablet Take 1 tablet (7.5 mg total) by mouth daily.   Omega-3 Fatty Acids (OMEGA 3 500 PO) Take 1 capsule by mouth daily in the afternoon.  OVER THE COUNTER MEDICATION Apply topically as needed. Hempvana   SYSTANE ULTRA 0.4-0.3 % SOLN SMARTSIG:1 Drop(s) In Eye(s) PRN   traMADol (ULTRAM) 50 MG tablet Take 1 tablet by mouth 2 (two) times daily as needed.   No facility-administered encounter medications on file as of 03/16/2023.   REVIEW OF SYSTEMS:  Gen: + Fatigue. Denies fever, sweats or chills. No weight loss.  CV: = Leg swelling. Denies chest pain or palpitations.  Resp: Denies cough, shortness of breath of hemoptysis.  GI: Denies heartburn, dysphagia, stomach or lower abdominal pain. No diarrhea or constipation.  GU: + Urinary leakage.  MS: +  Arthritis and back pain. + Muscle cramps.  Derm: Denies rash, itchiness, skin lesions or unhealing ulcers. Psych: Denies depression, anxiety, memory loss or confusion. Heme: Denies bruising, easy bleeding. Neuro: + Headaches. Endo:  Denies any problems with DM, thyroid or adrenal function.  PHYSICAL EXAM: BP 106/62 (BP Location: Left Arm, Patient Position: Sitting, Cuff Size: Normal)   Pulse 72   Ht 5' (1.524 m)   Wt 131 lb 6 oz (59.6 kg)   BMI 25.66 kg/m   General: 77 year old female in no acute distress. Head: Normocephalic and atraumatic. Eyes:  Sclerae non-icteric, conjunctive pink. Ears: Normal auditory acuity. Mouth: Dentition intact. No ulcers or lesions.  Neck: Supple, no lymphadenopathy or thyromegaly.  Lungs: Clear bilaterally to auscultation without wheezes, crackles or rhonchi. Heart: Regular rate and rhythm. No murmur, rub or  gallop appreciated.  Abdomen: Soft, nontender, nondistended. No masses. No hepatosplenomegaly. Normoactive bowel sounds x 4 quadrants.  Rectal: Deferred.  Musculoskeletal: Symmetrical with no gross deformities. Skin: Warm and dry. No rash or lesions on visible extremities. Extremities: No edema. Neurological: Alert oriented x 4, no focal deficits.  Psychological:  Alert and cooperative. Normal mood and affect.  ASSESSMENT AND PLAN:  77 year old female female with GERD, dysphagia 1 to 2 years -Barium swallow study with  tablet  -EGD benefits and risks discussed including risk with sedation, risk of bleeding, perforation and infection  -Famotidine 20mg  one tab po every day -Further recommendations to be determined after the above evaluation completed  Colon cancer screening. Patient denies having a history of colon polyps. Virtual colonoscopy 2017, no polyps identified.  -Dr. Chales Abrahams to verify if any further virtual or conventional colonoscopies warranted at the age of 15  Mild anemia. Normal Iron and B 12 levels  -CBC -Continue follow up with PCP  Frequent constipation -Benefiber 1 tablespoon daily to regulate bowel pattern  Sjogren's syndrome    CC:  Jarold Motto, [place]

## 2023-03-18 NOTE — Progress Notes (Signed)
Agree with assessment/plan.  Raj Florestine Carmical, MD [facility] [phone]  

## 2023-03-23 ENCOUNTER — Ambulatory Visit (HOSPITAL_COMMUNITY)
Admission: RE | Admit: 2023-03-23 | Discharge: 2023-03-23 | Disposition: A | Discharge status: Home / Self Care | Payer: Medicare Other | Source: Ambulatory Visit | Attending: Nurse Practitioner | Admitting: Nurse Practitioner

## 2023-03-23 DIAGNOSIS — R1310 Dysphagia, unspecified: Secondary | ICD-10-CM | POA: Diagnosis present

## 2023-03-23 DIAGNOSIS — K219 Gastro-esophageal reflux disease without esophagitis: Secondary | ICD-10-CM | POA: Insufficient documentation

## 2023-04-07 ENCOUNTER — Ambulatory Visit: Payer: Medicare Other | Admitting: Gastroenterology

## 2023-04-07 ENCOUNTER — Other Ambulatory Visit: Payer: Self-pay | Admitting: Nurse Practitioner

## 2023-04-07 ENCOUNTER — Encounter: Payer: Self-pay | Admitting: Gastroenterology

## 2023-04-07 VITALS — BP 111/62 | HR 67 | Temp 97.2°F | Resp 14 | Ht 60.0 in | Wt 131.0 lb

## 2023-04-07 DIAGNOSIS — K219 Gastro-esophageal reflux disease without esophagitis: Secondary | ICD-10-CM

## 2023-04-07 DIAGNOSIS — K2289 Other specified disease of esophagus: Secondary | ICD-10-CM | POA: Diagnosis not present

## 2023-04-07 DIAGNOSIS — R1310 Dysphagia, unspecified: Secondary | ICD-10-CM | POA: Diagnosis not present

## 2023-04-07 DIAGNOSIS — K449 Diaphragmatic hernia without obstruction or gangrene: Secondary | ICD-10-CM

## 2023-04-07 MED ORDER — SODIUM CHLORIDE 0.9 % IV SOLN: 500.0000 mL | Freq: Once | INTRAVENOUS | Status: DC

## 2023-04-07 NOTE — Progress Notes (Signed)
 Called to room to assist during endoscopic procedure.  Patient ID and intended procedure confirmed with present staff. Received instructions for my participation in the procedure from the performing physician.

## 2023-04-07 NOTE — Progress Notes (Signed)
 03/16/2023 KARLEE STAFF [id number] 27-Oct-1945     CHIEF COMPLAINT: Difficulty swallowing    HISTORY OF PRESENT ILLNESS: Heather Hodges is a 78 year old female with a past medical history of osteoarthritis, osteoporosis, Sjogren's syndrome, hyperlipidemia, hypothyroidism, vitamin D  deficiency, mechanical fall resulting in a traumatic pneumothorax 2019 and diverticulosis. She presents to our office today as referred by Lucie Buttner PA-C for further evaluation regarding dysphagia. She is accompanied by her husband. She endorses having difficulty swallowing dry foods or unpeeled apples. She describes having food which gets stuck in her throat or upper esophagus which occurs several times weekly for the past 1 1/2 to 2 years. She sometimes drinks water and the stuck food goes down. She sometimes coughs and gags and the food goes down, does not come back up. She has heartburn every other day which is worse when she eats food away from home. Spicy and salty foods worsens her heartburn. She denies taking Meloxicam  which was on her medication list. No NSAID use. She takes Tylenol arthritis PRN. Nonsmoker. No alcohol use.  She started taking Fosamax  1 month ago.  She typically passes a normal brown bowel movement every day or every other day.  She sometimes has mild constipation.  No rectal bleeding or black stools.  She underwent a colonoscopy 10/2015 at [facility] records, results unknown but patient denied having any complications from this procedure.  Patient denies having a history of colon polyps as documented in her prior PMH list.  She subsequently underwent a virtual colonoscopy 12/2015 which was negative for colon polyps.  No known family history of colon polyps or colorectal cancer.  Patient denies having any history of cardiac or pulmonary disease.         Latest Ref Rng & Units 08/02/2022   11:05 AM 09/16/2021   10:42 AM 04/23/2021    9:43 AM  CBC  WBC 4.0 - 10.5 K/uL 6.8   7.0  8.1   Hemoglobin 12.0 - 15.0 g/dL 88.5  87.7  87.3   Hematocrit 36.0 - 46.0 % 34.2  36.2  37.6   Platelets 150 - 400 K/uL 253  243.0  239.0           Latest Ref Rng & Units 08/13/2022    9:15 AM 08/13/2022    9:09 AM 08/02/2022   11:05 AM  CMP  Glucose 70 - 99 mg/dL     79   BUN 8 - 23 mg/dL     13   Creatinine 9.55 - 1.00 mg/dL     9.24   Sodium 864 - 145 mmol/L     136   Potassium 3.5 - 5.1 mmol/L     4.4   Chloride 98 - 111 mmol/L     102   CO2 22 - 32 mmol/L     27   Calcium  8.6 - 10.4 mg/dL   9.1  8.6   Total Protein 6.1 - 8.1 g/dL 6.5    6.3   Total Bilirubin 0.3 - 1.2 mg/dL     0.4   Alkaline Phos 38 - 126 U/L     55   AST 15 - 41 U/L     17   ALT 0 - 44 U/L     16    Iron 74. Iron saturation 26%. Ferritin 53.  B12 level 450.    Virtual colonoscopy 01/25/2016:   No suspicious polypoid lesions or annular constricting lesions.  Scattered diverticula.   Virtual colonoscopy is not designed to detect diminutive polyps  (i.e., less than or equal to 5 mm), the presence or absence of which  may not affect clinical management.   CT ABDOMEN AND PELVIS WITHOUT CONTRAST   FINDINGS:   Lower chest: Lung bases are clear. No effusions. Heart is normal  size.   Hepatobiliary: Scattered hypodensities in the liver, likely small  cysts. No biliary duct dilatation. Gallbladder unremarkable.   Pancreas: No focal abnormality or ductal dilatation.   Spleen: No focal abnormality.  Normal size.   Adrenals/Urinary Tract: No adrenal abnormality. No focal renal  abnormality. No stones or hydronephrosis. Urinary bladder is  unremarkable.   Stomach/Bowel: Stomach and small bowel grossly unremarkable.   Vascular/Lymphatic: Scattered aortic calcifications. No aneurysm. No  adenopathy.   Reproductive: Uterus and adnexa unremarkable.  No mass.   Other: No free fluid or free air.   Musculoskeletal: No acute bony abnormality or focal bone lesion.  Degenerative facet disease in  the lower lumbar spine.   IMPRESSION: No acute or significant abnormality in the abdomen or  pelvis.        Past Medical History:  Diagnosis Date   Arthritis     Colon polyps     Diverticulosis     History of chicken pox     Hyperlipidemia     Hypothyroidism     Osteoporosis     Vitamin D  deficiency               Past Surgical History:  Procedure Laterality Date   BREAST BIOPSY Left      Benign   CARPAL TUNNEL RELEASE Bilateral     TONSILLECTOMY AND ADENOIDECTOMY       TRIGGER FINGER RELEASE            Social History: She is married.  She has 1 son.  She is a retired manufacturing systems engineer.  Non-smoker.  No alcohol use.  No drug use.   Family History: family history includes Arthritis in her brother and mother; Breast cancer in her mother; Dementia in her father and mother; Hearing loss in her brother; Heart Problems in her son; Heart attack in her father; High Cholesterol in her father and mother; Hypercholesterolemia in her brother.  No known family history of esophageal, gastric or colorectal cancer.     Allergies       Allergies  Allergen Reactions   Risedronate Sodium Other (See Comments)      TMJ dysfunction              Outpatient Encounter Medications as of 03/16/2023  Medication Sig   acetaminophen (TYLENOL 8 HOUR ARTHRITIS PAIN) 650 MG CR tablet Take 650 mg by mouth every 8 (eight) hours as needed for pain.   alendronate  (FOSAMAX ) 70 MG tablet Take 1 tablet (70 mg total) by mouth every 7 (seven) days.   amoxicillin (AMOXIL) 500 MG capsule Take 500 mg by mouth 3 (three) times daily.   atorvastatin  (LIPITOR) 80 MG tablet Take 1 tablet (80 mg total) by mouth daily.   b complex vitamins capsule Take 1 capsule by mouth every other day.   Calcium  Carbonate-Vitamin D  (CALCIUM  500+D PO) Take 2 each by mouth every other day.   dimenhyDRINATE (DRAMAMINE PO) Take by mouth as needed.   ketoconazole  (NIZORAL ) 2 % cream Apply to affected area 1-2 times daily (Patient  taking differently: as needed. Apply to affected area 1-2 times daily)   levothyroxine  (SYNTHROID ) 75  MCG tablet Take 1 tablet (75 mcg total) by mouth every other day.   levothyroxine  (SYNTHROID ) 88 MCG tablet Take 1 tablet (88 mcg total) by mouth every other day.   lidocaine (XYLOCAINE) 4 % external solution Apply topically.   Lidocaine 4 % PTCH Place onto the skin.   meloxicam  (MOBIC ) 7.5 MG tablet Take 1 tablet (7.5 mg total) by mouth daily.   Omega-3 Fatty Acids (OMEGA 3 500 PO) Take 1 capsule by mouth daily in the afternoon.   OVER THE COUNTER MEDICATION Apply topically as needed. Hempvana   SYSTANE ULTRA 0.4-0.3 % SOLN SMARTSIG:1 Drop(s) In Eye(s) PRN   traMADol (ULTRAM) 50 MG tablet Take 1 tablet by mouth 2 (two) times daily as needed.      No facility-administered encounter medications on file as of 03/16/2023.      REVIEW OF SYSTEMS:  Gen: + Fatigue. Denies fever, sweats or chills. No weight loss.  CV: = Leg swelling. Denies chest pain or palpitations.  Resp: Denies cough, shortness of breath of hemoptysis.  GI: Denies heartburn, dysphagia, stomach or lower abdominal pain. No diarrhea or constipation.  GU: + Urinary leakage.  MS: +  Arthritis and back pain. + Muscle cramps.  Derm: Denies rash, itchiness, skin lesions or unhealing ulcers. Psych: Denies depression, anxiety, memory loss or confusion. Heme: Denies bruising, easy bleeding. Neuro: + Headaches. Endo:  Denies any problems with DM, thyroid  or adrenal function.   PHYSICAL EXAM: BP 106/62 (BP Location: Left Arm, Patient Position: Sitting, Cuff Size: Normal)   Pulse 72   Ht 5' (1.524 m)   Wt 131 lb 6 oz (59.6 kg)   BMI 25.66 kg/m    General: 78 year old female in no acute distress. Head: Normocephalic and atraumatic. Eyes:  Sclerae non-icteric, conjunctive pink. Ears: Normal auditory acuity. Mouth: Dentition intact. No ulcers or lesions.  Neck: Supple, no lymphadenopathy or thyromegaly.  Lungs: Clear  bilaterally to auscultation without wheezes, crackles or rhonchi. Heart: Regular rate and rhythm. No murmur, rub or gallop appreciated.  Abdomen: Soft, nontender, nondistended. No masses. No hepatosplenomegaly. Normoactive bowel sounds x 4 quadrants.  Rectal: Deferred.  Musculoskeletal: Symmetrical with no gross deformities. Skin: Warm and dry. No rash or lesions on visible extremities. Extremities: No edema. Neurological: Alert oriented x 4, no focal deficits.  Psychological:  Alert and cooperative. Normal mood and affect.   ASSESSMENT AND PLAN:   78 year old female female with GERD, dysphagia 1 to 2 years -Barium swallow study with  tablet  -EGD benefits and risks discussed including risk with sedation, risk of bleeding, perforation and infection  -Famotidine  20mg  one tab po every day -Further recommendations to be determined after the above evaluation completed   Colon cancer screening. Patient denies having a history of colon polyps. Virtual colonoscopy 2017, no polyps identified.  -Dr. Charlanne to verify if any further virtual or conventional colonoscopies warranted at the age of 82   Mild anemia. Normal Iron and B 12 levels  -CBC -Continue follow up with PCP   Frequent constipation -Benefiber 1 tablespoon daily to regulate bowel pattern   Sjogren's syndrome      CC:  Job Lukes, [place]   Attending physician's note   I have taken history, reviewed the chart and examined the patient. I performed a substantive portion of this encounter, including complete performance of at least one of the key components, in conjunction with the APP. I agree with the Advanced Practitioner's note, impression and recommendations.   Barium swallow- small  HH, presbyesophagus  for EGD with dilatation  hold off on colonoscopy for colorectal cancer screening d/t age.    Anselm Bring, MD [facility] [phone]

## 2023-04-07 NOTE — Progress Notes (Signed)
 Report to PACU, RN, vss, BBS= Clear.

## 2023-04-07 NOTE — Op Note (Signed)
 [facility] Patient Name: Heather Hodges Procedure Date: 04/07/2023 10:16 AM MRN: [id number] Endoscopist: Lynnie Bring , MD, [id number] Age: 78 Referring MD:  Date of Birth: [birth year] Gender: Female Account #: [id number] Procedure:                Upper GI endoscopy Indications:              Dysphagia with barium swallow showing small HH,                            presbyesophagus Medicines:                Monitored Anesthesia Care Procedure:                Pre-Anesthesia Assessment:                           - Prior to the procedure, a History and Physical                            was performed, and patient medications and                            allergies were reviewed. The patient's tolerance of                            previous anesthesia was also reviewed. The risks                            and benefits of the procedure and the sedation                            options and risks were discussed with the patient.                            All questions were answered, and informed consent                            was obtained. Prior Anticoagulants: The patient has                            taken no anticoagulant or antiplatelet agents. ASA                            Grade Assessment: II - A patient with mild systemic                            disease. After reviewing the risks and benefits,                            the patient was deemed in satisfactory condition to                            undergo the procedure.  After obtaining informed consent, the endoscope was                            passed under direct vision. Throughout the                            procedure, the patient's blood pressure, pulse, and                            oxygen saturations were monitored continuously. The                            Olympus Scope [id number] was introduced through the                            mouth, and advanced to the second part  of duodenum.                            The upper GI endoscopy was accomplished without                            difficulty. The patient tolerated the procedure                            well. Scope In: Scope Out: Findings:                 The lower third of the esophagus was mildly                            tortuous. Biopsies were obtained from the proximal                            and distal esophagus with cold forceps for                            histology of suspected eosinophilic esophagitis.                            The scope was withdrawn. Dilation was performed                            with a Maloney dilator with mild resistance at 50                            Fr.                           A 2 cm hiatal hernia was present extending from 35                            up to 37 cm.                           The entire examined stomach was normal. Biopsies  were taken with a cold forceps for histology.                           The examined duodenum was normal. Biopsies for                            histology were taken with a cold forceps for                            evaluation of celiac disease. Complications:            No immediate complications. Estimated Blood Loss:     Estimated blood loss: none. Impression:               - Presbyesophagus s/p esophageal dilatation                           - 2 cm hiatal hernia.                           - Otherwise normal EGD. Recommendation:           - Patient has a contact number available for                            emergencies. The signs and symptoms of potential                            delayed complications were discussed with the                            patient. Return to normal activities tomorrow.                            Written discharge instructions were provided to the                            patient.                           - Post dilatation diet.                            - Continue present medications.                           - Await pathology results.                           - Nonpharmacologic means of reflux control.                           - Chew foods especially meats and breads well and                            eat slowly.                           -  The findings and recommendations were discussed                            with the patient's family. Lynnie Bring, MD 04/07/2023 10:35:21 AM This report has been signed electronically.

## 2023-04-07 NOTE — Patient Instructions (Signed)
Discharge instructions given. Handouts on Hiatal Hernia and Dilatation diet. Resume previous medications. YOU HAD AN ENDOSCOPIC PROCEDURE TODAY AT [facility]:   Refer to the procedure report that was given to you for any specific questions about what was found during the examination.  If the procedure report does not answer your questions, please call your gastroenterologist to clarify.  If you requested that your care partner not be given the details of your procedure findings, then the procedure report has been included in a sealed envelope for you to review at your convenience later.  YOU SHOULD EXPECT: Some feelings of bloating in the abdomen. Passage of more gas than usual.  Walking can help get rid of the air that was put into your GI tract during the procedure and reduce the bloating. If you had a lower endoscopy (such as a colonoscopy or flexible sigmoidoscopy) you may notice spotting of blood in your stool or on the toilet paper. If you underwent a bowel prep for your procedure, you may not have a normal bowel movement for a few days.  Please Note:  You might notice some irritation and congestion in your nose or some drainage.  This is from the oxygen used during your procedure.  There is no need for concern and it should clear up in a day or so.  SYMPTOMS TO REPORT IMMEDIATELY:  Following upper endoscopy (EGD)  Vomiting of blood or coffee ground material  New chest pain or pain under the shoulder blades  Painful or persistently difficult swallowing  New shortness of breath  Fever of 100F or higher  Black, tarry-looking stools  For urgent or emergent issues, a gastroenterologist can be reached at any hour by calling [phone]. Do not use MyChart messaging for urgent concerns.    DIET:  We do recommend a small meal at first, but then you may proceed to your regular diet.  Drink plenty of fluids but you should avoid alcoholic beverages for 24 hours.  ACTIVITY:   You should plan to take it easy for the rest of today and you should NOT DRIVE or use heavy machinery until tomorrow (because of the sedation medicines used during the test).    FOLLOW UP: Our staff will call the number listed on your records the next business day following your procedure.  We will call around 7:15- 8:00 am to check on you and address any questions or concerns that you may have regarding the information given to you following your procedure. If we do not reach you, we will leave a message.     If any biopsies were taken you will be contacted by phone or by letter within the next 1-3 weeks.  Please call us at [phone] if you have not heard about the biopsies in 3 weeks.    SIGNATURES/CONFIDENTIALITY: You and/or your care partner have signed paperwork which will be entered into your electronic medical record.  These signatures attest to the fact that that the information above on your After Visit Summary has been reviewed and is understood.  Full responsibility of the confidentiality of this discharge information lies with you and/or your care-partner.

## 2023-04-08 ENCOUNTER — Telehealth: Payer: Self-pay

## 2023-04-08 NOTE — Telephone Encounter (Signed)
  Follow up Call-     04/07/2023    9:32 AM  Call back number  Post procedure Call Back phone  # [phone]  Permission to leave phone message Yes     Patient questions:  Do you have a fever, pain , or abdominal swelling? No. Pain Score  0 *  Have you tolerated food without any problems? Yes.    Have you been able to return to your normal activities? Yes.    Do you have any questions about your discharge instructions: Diet   No. Medications  No. Follow up visit  No.  Do you have questions or concerns about your Care? Yes.   Pt reports her throat is sore. Informed to use some chloraseptic spray/ lozenges. Verbalizes understanding. Pt informed me she was told it would be like that for 5 days. Informed pt that if it did not resolve or if it got worse to call us  back immediately. Pt verbalizes understanding.  No further questions.  Actions: * If pain score is 4 or above: No action needed, pain <4.

## 2023-04-09 LAB — SURGICAL PATHOLOGY

## 2023-04-14 ENCOUNTER — Other Ambulatory Visit: Payer: Self-pay | Admitting: Physician Assistant

## 2023-04-14 ENCOUNTER — Encounter: Payer: Self-pay | Admitting: Physician Assistant

## 2023-04-14 ENCOUNTER — Telehealth: Payer: Self-pay | Admitting: Physician Assistant

## 2023-04-14 DIAGNOSIS — N644 Mastodynia: Secondary | ICD-10-CM

## 2023-04-14 DIAGNOSIS — Z1231 Encounter for screening mammogram for malignant neoplasm of breast: Secondary | ICD-10-CM

## 2023-04-14 NOTE — Telephone Encounter (Signed)
 Copied from CRM [id number]. Topic: Referral - Question >> Apr 14, 2023 10:54 AM Nestora J wrote: Reason for CRM: Pt is requesting a referral to have a mammogram done on her breast at the [facility] Phone:[phone]   Pt callback [phone]

## 2023-04-14 NOTE — Telephone Encounter (Signed)
 Spoke to pt told her she does not need an order for screening Mammogram, according to last years Diagnostic Mammogram for left breast problem you just need screening done Asked pt is still having problems with left breast? Pt said she always has problem with left breast having discomfort and fullness in left breast seems worse. Told pt okay I will place order for Diagnostic Mammogram. Pt verbalized understanding. Order put in Epic.

## 2023-04-15 ENCOUNTER — Encounter: Payer: Self-pay | Admitting: Gastroenterology

## 2023-04-15 NOTE — Telephone Encounter (Signed)
 PT is requesting that the [facility] Please advise.

## 2023-04-21 ENCOUNTER — Telehealth: Payer: Self-pay | Admitting: Physician Assistant

## 2023-04-21 NOTE — Telephone Encounter (Unsigned)
Copied from CRM [id number]. Topic: Clinical - Medication Question >> Apr 21, 2023  4:12 PM Tiffany H wrote: Reason for CRM: Patient called to advise that pharmacy is out of Synthroid 75mg . Patient is supposed to be alternating 88mg  and 75mg . Patient has Synthroid 88mg  and would like to know if she should continue taking 88mg  full time or if she should find 75mg s. Please assist.

## 2023-04-22 MED ORDER — LEVOTHYROXINE SODIUM 75 MCG PO TABS: 75.0000 ug | ORAL_TABLET | ORAL | 1 refills | Status: DC

## 2023-04-22 NOTE — Telephone Encounter (Signed)
Spoke to pt told her we can send her Synthroid 75 mcg to a [facility] pharmacy since she can not get it at [facility]. Pt said no,it is [facility] that does not have it and need it sent to [facility] due to insurance. Told her okay, sorry for the confusion. I will [facility] [address]. IF you have any trouble getting it please let me know. Pt verbalized understanding. Rx sent.

## 2023-04-22 NOTE — Addendum Note (Signed)
Addended by: Jimmye Norman on: 04/22/2023 01:12 PM   Modules accepted: Orders

## 2023-04-22 NOTE — Telephone Encounter (Signed)
Please see message and advise 

## 2023-04-30 ENCOUNTER — Ambulatory Visit
Admission: RE | Admit: 2023-04-30 | Discharge: 2023-04-30 | Disposition: A | Discharge status: Home / Self Care | Payer: Medicare Other | Source: Ambulatory Visit | Attending: Physician Assistant | Admitting: Physician Assistant

## 2023-04-30 ENCOUNTER — Ambulatory Visit: Payer: Medicare Other

## 2023-04-30 DIAGNOSIS — N644 Mastodynia: Secondary | ICD-10-CM

## 2023-05-06 ENCOUNTER — Ambulatory Visit: Payer: Medicare Other | Admitting: Physician Assistant

## 2023-05-06 ENCOUNTER — Encounter: Payer: Self-pay | Admitting: Physician Assistant

## 2023-05-06 VITALS — BP 128/70 | HR 73 | Temp 97.4°F | Ht 60.0 in | Wt 130.2 lb

## 2023-05-06 DIAGNOSIS — L821 Other seborrheic keratosis: Secondary | ICD-10-CM

## 2023-05-06 DIAGNOSIS — R21 Rash and other nonspecific skin eruption: Secondary | ICD-10-CM

## 2023-05-06 DIAGNOSIS — R0781 Pleurodynia: Secondary | ICD-10-CM

## 2023-05-06 DIAGNOSIS — L989 Disorder of the skin and subcutaneous tissue, unspecified: Secondary | ICD-10-CM

## 2023-05-06 NOTE — Patient Instructions (Signed)
 It was great to see you!  We will refer you to dermatology for further evaluation  See attached information regarding seborrheic keratosis -- this is what's on your back and temples  Take care,  Oseas Detty PA-C

## 2023-05-06 NOTE — Progress Notes (Signed)
 Heather Hodges is a 78 y.o. female here for a new problem.  History of Present Illness:   Chief Complaint  Patient presents with   Rash    Pt c/o face is breaking out all different places, tender at times.   Rash  She complains today of a rash throughout her face, most prominently around her left temple and right cheeks. Several barnacles on her back as well. She first noticed her rash about a year ago. She denies any accompanying itchiness but does report some tenderness/soreness and minimal swelling. Doesn't typically pick on spots nor is there any discharge or puss. She denies using any new products on her face. Skin around her eyes is pale and somewhat puffy but she can't seem to identify many reason to it.  Reports excision of pre-cancerous spots previously and reports family history of skin caner in father. Denies wearing daily sunscreen.  Requesting a dermatology referral.     Vision problem  She complains of occasionally noticing floaters in her vision.  Also reports experiencing flashing-like light visuals after looking at bright light.  She states that she's able to see well in dark, doesn't need much light. She continues to complains of experiencing dizziness when laying her head back.   Breast Pain She continues to complain of pain located under her left breast, pain tends to radiate to her back.  She states that pain is currently at it's worst. Has underwent a mammogram recently which was normal but indicated benign density.  Also experiencing accompanying dizziness. She states that she can get some relief in certain positions.  Has been using meloxicam  7.5 mg. Does report family history in mother.   Past Medical History:  Diagnosis Date   Arthritis    Cataract    Colon polyps    Diverticulosis    Fibromyalgia    History of chicken pox    Hyperlipidemia    Hypothyroidism    Osteoporosis    Sjogren's disease (HCC)    Vitamin D  deficiency      Social  History   Tobacco Use   Smoking status: Never    Passive exposure: Past   Smokeless tobacco: Never  Vaping Use   Vaping status: Never Used  Substance Use Topics   Alcohol use: Not Currently   Drug use: Never    Past Surgical History:  Procedure Laterality Date   BREAST BIOPSY Left    Benign   CARPAL TUNNEL RELEASE Bilateral    TONSILLECTOMY AND ADENOIDECTOMY     TRIGGER FINGER RELEASE Right    Thumb    Family History  Problem Relation Age of Onset   Arthritis Mother    Breast cancer Mother        75s-70s   High Cholesterol Mother    Dementia Mother    Lung cancer Mother    Heart attack Father    High Cholesterol Father    Dementia Father    Arthritis Brother    Hearing loss Brother    Hypercholesterolemia Brother    Heart Problems Son    Colon cancer Neg Hx    Colon polyps Neg Hx    Esophageal cancer Neg Hx    Rectal cancer Neg Hx    Stomach cancer Neg Hx     Allergies  Allergen Reactions   Risedronate Sodium Other (See Comments)    TMJ dysfunction    Current Medications:   Current Outpatient Medications:    acetaminophen (TYLENOL 8 HOUR ARTHRITIS PAIN)  650 MG CR tablet, Take 650 mg by mouth every 8 (eight) hours as needed for pain., Disp: , Rfl:    alendronate  (FOSAMAX ) 70 MG tablet, Take 1 tablet (70 mg total) by mouth every 7 (seven) days., Disp: 12 tablet, Rfl: 11   Artificial Saliva (BIOTENE DRY MOUTH) LOZG, Use as directed in the mouth or throat as needed., Disp: , Rfl:    Calcium  Carbonate-Vitamin D  (CALCIUM  500+D PO), Take 2 each by mouth every other day., Disp: , Rfl:    dimenhyDRINATE (DRAMAMINE PO), Take by mouth as needed., Disp: , Rfl:    famotidine  (PEPCID ) 20 MG tablet, TAKE 1 TABLET BY MOUTH EVERY DAY, Disp: 90 tablet, Rfl: 1   ketoconazole  (NIZORAL ) 2 % cream, Apply to affected area 1-2 times daily (Patient taking differently: as needed. Apply to affected area 1-2 times daily), Disp: 15 g, Rfl: 0   levothyroxine  (SYNTHROID ) 75 MCG tablet,  Take 1 tablet (75 mcg total) by mouth every other day., Disp: 90 tablet, Rfl: 1   levothyroxine  (SYNTHROID ) 88 MCG tablet, Take 1 tablet (88 mcg total) by mouth every other day., Disp: 90 tablet, Rfl: 1   lidocaine (XYLOCAINE) 4 % external solution, Apply topically., Disp: , Rfl:    Lidocaine 4 % PTCH, Place onto the skin., Disp: , Rfl:    meloxicam  (MOBIC ) 7.5 MG tablet, Take 1 tablet (7.5 mg total) by mouth daily., Disp: 30 tablet, Rfl: 0   Multiple Vitamin (MULTIVITAMIN) capsule, Take 1 capsule by mouth every other day., Disp: , Rfl:    Omega-3 Fatty Acids (OMEGA 3 500 PO), Take 1 capsule by mouth daily in the afternoon., Disp: , Rfl:    OVER THE COUNTER MEDICATION, Apply topically as needed. Hempvana, Disp: , Rfl:    SYSTANE ULTRA 0.4-0.3 % SOLN, SMARTSIG:1 Drop(s) In Eye(s) PRN, Disp: , Rfl:    atorvastatin  (LIPITOR) 80 MG tablet, Take 1 tablet (80 mg total) by mouth daily. (Patient not taking: Reported on 05/06/2023), Disp: 90 tablet, Rfl: 3   Review of Systems:   Review of Systems  Eyes:  Positive for double vision.  Skin:  Positive for rash.  Neurological:  Positive for dizziness.    Vitals:   Vitals:   05/06/23 0945  BP: 128/70  Pulse: 73  Temp: (!) 97.4 F (36.3 C)  TempSrc: Temporal  SpO2: 96%  Weight: 130 lb 4 oz (59.1 kg)  Height: 5' (1.524 m)     Body mass index is 25.44 kg/m.  Physical Exam:   Physical Exam Constitutional:      Appearance: Normal appearance. She is well-developed.  HENT:     Head: Normocephalic and atraumatic.  Eyes:     General: Lids are normal.     Extraocular Movements: Extraocular movements intact.     Conjunctiva/sclera: Conjunctivae normal.  Pulmonary:     Effort: Pulmonary effort is normal.  Musculoskeletal:        General: Normal range of motion.     Cervical back: Normal range of motion and neck supple.  Skin:    General: Skin is warm and dry.     Comments: Multiple flesh-colored waxy plaques on face and back  Erythematous  lesion to R cheek  Forehead with tiny flesh-colored raised lesions  Neurological:     Mental Status: She is alert and oriented to person, place, and time.  Psychiatric:        Attention and Perception: Attention and perception normal.        Mood  and Affect: Mood normal.        Behavior: Behavior normal.        Thought Content: Thought content normal.        Judgment: Judgment normal.      Assessment and Plan:   Seborrheic keratoses Discussed that these are benign lesions Continue to monitor  Skin lesion Referral to dermatology for further evaluation Encouraged daily spf moisturizer  Rash and nonspecific skin eruption Unclear etiology, defer to dermatology to evaluate  Rib pain on left side Ongoing issue Denies any new concerns Continue efforts at regular movement and mobic  7.5 mg as needed for inflammation   Lucie Buttner, PA-C  Heather Hodges,acting as a scribe for Lucie Buttner, PA.,have documented all relevant documentation on the behalf of Lucie Buttner, PA,as directed by  Lucie Buttner, PA while in the presence of Lucie Buttner, [place].   I, Lucie Buttner, [place], have reviewed all documentation for this visit. The documentation on 05/06/23 for the exam, diagnosis, procedures, and orders are all accurate and complete.   I spent a total of 34 minutes on this visit, today 05/06/23, which included reviewing chart, ordering tests, discussing plan of care with patient and using shared-decision making on next steps,  and documenting the findings in the note.

## 2023-05-26 ENCOUNTER — Ambulatory Visit: Payer: Medicare Other | Admitting: Psychology

## 2023-05-26 ENCOUNTER — Ambulatory Visit (INDEPENDENT_AMBULATORY_CARE_PROVIDER_SITE_OTHER): Payer: Medicare Other | Admitting: Psychology

## 2023-05-26 ENCOUNTER — Encounter: Payer: Self-pay | Admitting: Psychology

## 2023-05-26 DIAGNOSIS — R419 Unspecified symptoms and signs involving cognitive functions and awareness: Secondary | ICD-10-CM

## 2023-05-26 DIAGNOSIS — R4189 Other symptoms and signs involving cognitive functions and awareness: Secondary | ICD-10-CM

## 2023-05-26 NOTE — Progress Notes (Signed)
   Psychometrician Note   Cognitive testing was administered to [name] by Wallace Keller, B.S. (psychometrist) under the supervision of Dr. Annice Pih, Psy.D., licensed psychologist on 05/26/2023. Heather Hodges did not appear overtly distressed by the testing session per behavioral observation or responses across self-report questionnaires. Rest breaks were offered.    The battery of tests administered was selected by Dr. Annice Pih, Psy.D. with consideration to Heather Hodges's current level of functioning, the nature of her symptoms, emotional and behavioral responses during interview, level of literacy, observed level of motivation/effort, and the nature of the referral question. This battery was communicated to the psychometrist. Communication between Dr. Annice Pih, Psy.D. and the psychometrist was ongoing throughout the evaluation and Dr. Annice Pih, Psy.D. was immediately accessible at all times. Dr. Annice Pih, Psy.D. provided supervision to the psychometrist on the date of this service to the extent necessary to assure the quality of all services provided.    Heather Hodges will return within approximately 1-2 weeks for an interactive feedback session with Dr. Robbie Lis at which time her test performances, clinical impressions, and treatment recommendations will be reviewed in detail. Heather Hodges understands she can contact our office should she require our assistance before this time.  A total of 125 minutes of billable time were spent face-to-face with Heather Hodges by the psychometrist. This includes both test administration and scoring time. Billing for these services is reflected in the clinical report generated by Dr. Annice Pih, Psy.D.  This note reflects time spent with the psychometrician and does not include test scores or any clinical interpretations made by Dr. Robbie Lis. The full report will follow in a separate note.

## 2023-05-26 NOTE — Progress Notes (Unsigned)
 NEUROPSYCHOLOGICAL EVALUATION [place]  [place] Department of Neurology  Date of Evaluation: 05/26/2023  REASON FOR REFERRAL   Heather Hodges is a 78 year old, right-handed, White female with 14 years of formal education. She was referred for neuropsychological evaluation by her primary care provider, Jarold Motto, PA, to assess current neurocognitive functioning, document potential cognitive deficits, and assist with treatment planning. This is her first neuropsychological evaluation.  SUMMARY OF RESULTS   Patient is estimated to be at least of low average cognitive ability based on word reading as well as educational and occupational attainments. Considering this estimate, she performed well within expectations on most tasks, particularly in the domains of attention, processing speed, executive functioning, language, and visual-spatial abilities. Comparatively, memory performance was variable. She demonstrated stronger encoding and recall of a short story, which tends to be a more structured verbal memory task, but recognition was below expectations. With regard to an unstructured word list, she demonstrated adequate immediate recall of the words, with benefit from repetition, but her spontaneous recall and recognition after a delay were below expectations. While verbal memory was variable, she demonstrated intact encoding, recall, and recognition of a complex figure. The only other exception to broadly intact performance was a low score on a task of rapid visual scanning, but this is not overly concerning given normal performances on other tasks with similar demands. On self-report questionnaires, she did not report clinically significant levels of depression or anxiety.  DIAGNOSTIC IMPRESSION   Results of the current evaluation indicate a very mild weakness in aspects of verbal memory, with otherwise broadly normal test performance, in the setting of grossly preserved  functional independence. While there is not enough impairment at this time to warrant a diagnosis of mild cognitive impairment, mild memory weaknesses should be monitored over time to track possible progression. Considering pertinent background information, the etiology of identified weaknesses is likely multifactorial and related to chronic pain and poor sleep. As test performance was largely intact, I have less concern at this time about the possibility of persistent cognitive sequelae of prior head injury. To the extent that modifiable factors (e.g., pain, sleep) improve, she may find that her cognition improves. Additional follow-up recommendations are listed below.  ICD-10 Codes: R41.9 Cognitive changes  RECOMMENDATIONS   A repeat neuropsychological evaluation in 12-18 months (or sooner if functional decline is noted) is recommended.  Discuss with your primary care provider the risks and benefits of starting a medication that can help slow memory decline.  To improve sleep quality, it is recommended that the patient reduce daytime naps and limit liquid intake in the evening to minimize nighttime awakenings for bathroom use. These adjustments may help promote more restful and continuous sleep. She may also benefit from the  implementation of sleep hygiene techniques, including:  -Go to bed and get up at the same time each day to help your body establish a regular rhythm. -Establish and maintain a bedtime routine. Certain activities such as stretching, meditating, listening to soft music, or reading ~15 minutes before bedtime can be a great way to regularly get your brain and body ready for sleep. -Get regular exercise, but not in the hours before bedtime. -Use comfortable bedding and maintain a cool temperature in your bedroom -Block out light and distracting noise. -Avoid watching television or using your phone/computer in bed. -Avoid staying in bed if you have difficulty falling asleep. If  you have not been able to get to sleep after about 20 minutes or  more, get up and do something calming or boring until you feel sleepy, then return to bed and try again.  Patient is encouraged to continue making healthy lifestyle choices in order to enhance cardiovascular health and minimize cognitive decline. Medical management of the patient's vascular risk factors (i.e., hyperlipidemia), maintaining a healthy diet (e.g., Mediterranean), and regular physician approved exercise is recommended.   Patient is encouraged to participate in enjoyable, stimulating activities outside the home, such as regular exercise and social activities. Remaining socially active and maintaining a structured schedule can improve sleep and cognition.  Patient should consider implementing compensatory strategies to maximize independence and maintain daily functioning. Examples include:   -Adhere to routine. Compensatory strategies work best when they are used consistently. Use a planner, calendar, or white board that has the schedule and important events for the day clearly listed to reference and cross off when tasks are complete.  -Ask for written information, especially if it is new or unfamiliar (e.g., information provided at a doctor's appointment).  -Create an organized environment. Keep items that can be easily misplaced in a sensible location and get into the habit of always returning the items to those places.  -Pay attention and reduce distractions. Make a point of focusing attention on information you want to remember. One-on-one interaction is more likely to facilitate attention and minimize distraction. Make eye contact and repeat the information out loud after you hear it. Reduce interruptions or distractions especially when attempting to learn new information.  -Create associations. When learning something new, think about and understand the information. Explain it in your own words or try to associate it with  something you already know. Take notes to help remember important details. -Evaluate goals and plan accordingly. When confronted by many different tasks, begin by making a list that prioritizes each task and estimates the time it will take to complete. Break down complicated tasks into smaller, more manageable steps.  -Focus on one task at a time and complete each task before starting another. Avoid multitasking.   DISPOSITION   The patient will follow up with the referring provider, Ms. Worley. Patient should return for repeat neuropsychological testing in 12-18 months to monitor her course and assist with diagnosis and treatment planning. The patient and her husband will be provided verbal feedback in approximately one week regarding the findings and impression during this visit.  The remainder of the report includes the details of the patient's background and a table of results from the current evaluation, which support the summary and recommendations described above.  BACKGROUND   History of Presenting Illness: The following information was obtained following a review of medical records and an interview with the patient and her husband, Jeannett Senior. Patient established care with neurology in November 2022 for intermittent dizziness. She has experienced persistent neck and back pain since April 2019, at which time she fell off a ladder and sustained a right hemorrhagic contusion with thoracic vertebral body fractures and pneumothorax. During two separate physical therapy appointments, she experienced episodes of dizziness, prompting the referral to neurology. Today, the patient expressed interest in determining whether her history of multiple head injuries and family history of dementia may have increased her risk for cognitive decline.  Cognitive Functioning: During today's appointment, the patient reported isolated concerns about her short-term memory (e.g., forgetting details). She feels she is  better able to remember information when it is important and she is focused. She denied concerns regarding attention, processing speed, word finding, navigation, and executive  functioning (e.g., planning, organizing, problem-solving). Comparatively, her husband believes her short-term memory difficulties are variable and may occur despite the patient paying attention. He reported that the patient may forget details of conversations and repeatedly request answers to questions she has already asked. He further noted slower processing speed, difficulty with decision making, and occasional difficulties with navigation when she is in a less familiar area. Both the patient and her husband estimate the onset of cognitive difficulties to be around 2019, when the patient experienced the significant fall. They also noted grieving the loss of their grandson that year in May 2019.  Physical Functioning: Patient endorsed difficulties with sleep maintenance, especially after waking to use the restroom. She averages 4-5 hours of sleep per night, though she says this estimate may vary by the night, and notes a tendency to sleep more comfortably during her daytime nap around 2 p.m. Appetite is stable, though the patient reported that food tastes differently; she can no longer tolerate certain foods as she once did and often prepares different meals for herself since she cannot eat the same foods as her husband. No changes to sense of smell were reported. Vision and hearing are stable. She stated her pain level is generally a 1/10 on average.  Emotional Functioning: Patient reported her current mood as "average/good," and her husband agreed. She enjoys reading, writing notes, solving puzzles, watching television, and taking walks when the weather is nice. She also enjoys traveling and camping with her husband in their RV but has been unable to do so in the past two and a half years due to physical pain/limitations.  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  Other Medical History: Remarkable for fibromyalgia, chronic pain syndrome, hyperlipidemia, hypothyroidism, osteoporosis, arthritis, and diverticulosis. As mentioned above, patient has sustained multiple falls, including the significant event in April 2019. No history of stroke, CNS infection, or seizure was reported.  Current Medications: Per patient, levothyroxine, alendronate, acetaminophen, Dramamine, lidocaine, famotidine, calcium carbonate-vitamin D, multivitamin, fish oil, ketoconazole, Hempvana, Biotene, and Systane. She reported that she is no longer taking atorvastatin or meloxicam.  Functional Status: Patient independently performs ADLs and IADLs without reported difficulty. She stopped driving 6-96 months ago as a proactive safety measure, though she denied issues with driving prior to making this decision; she noted that she is able to drive if needed (e.g., picking up her husband from a medical procedure). She manages her medications without errors. She is able to prepare meals, although she mentioned typically eating TV dinners. Her husband manages the finances, which has been the case since he automated the finances.  Family Neurological History: Remarkable for Alzheimer's disease (mother) and unspecified dementia (father).  Psychiatric History: History of depression, anxiety, prior mental health treatment, suicidal ideation, hallucinations, and psychiatric hospitalizations were not reported.  Substance Use History: Patient denied current use of alcohol, nicotine, marijuana, and illicit substances.  Social and Developmental History: Patient was born in [place], [place]. She reported "unknown" complications during her birth due to a "blood difference" which  could have been fatal to either her or her mother. While they were ultimately able to "save both," the patient noted that she still has forceps scars on her head and feels she was slower to learn in school as a result. History of developmental delays was denied. Patient resides with her husband and has one  child who lives in [place].  Educational and Occupational History: No history of childhood learning disability, special education services, or grade retention was reported despite the patient feeling she was slow to her learn. She completed high school on time and obtained an associate degree in early childhood education. 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  BEHAVIORAL OBSERVATIONS   Patient arrived on time and was accompanied by her husband, Jeannett Senior. She ambulated independently and without gait disturbance. She was alert and oriented with the exception of the date (stated it was the 27th instead of the 25th). She was appropriately groomed and dressed for the setting. No significant sensory or motor abnormalities were observed. Vision (corrected with lenses) and hearing were adequate for testing purposes. Speech was of normal rate, prosody, and volume. No conversational word-finding difficulties, paraphasic errors, or dysarthria were observed. Comprehension was conversationally intact. Thought processes were linear, logical, and coherent. Thought content was organized and devoid of delusions. Insight appeared intact. Affect was congruent with euthymic mood. She was cooperative and gave adequate effort during testing. Results are thought to accurately reflect her cognitive functioning at this time.  NEUROPSYCHOLOGICAL TESTING RESULTS   Tests Administered: Clock Drawing; Controlled Oral Word Association Test (COWAT): FAS; Dementia Rating Scale-2 (DRS-2) - Standard Form; Geriatric Anxiety Scale (GAS); Geriatric Depression Scale Short Form  (GDS-SF); Neuropsychological Assessment Battery (NAB) Form 1- Subtest(s): Naming, Judgement; Repeatable Battery for the Assessment of Neuropsychological Status Update (RBANS Update) - Form A; Test of Premorbid Functioning (TOPF); and Trail Making Test (TMT).  Test results are provided in the table below. Whenever possible, the patient's scores were compared against age-, sex-, and education-corrected normative samples. Interpretive descriptions are based on the AACN consensus conference statement on uniform labeling (Guilmette et al., 2020).  COGNITIVE SCREENING RAW  RANGE  DRS-2 Total Score 139 ss=12 High Average  DRS-2 Attention 37 ss=14 High Average  DRS-2 Initiation/Perseveration 37 ss=12 High Average  DRS-2 Construction 6 ss=10 Average  DRS-2 Conceptualization 39 ss=14 High Average  DRS-2 Memory 20 ss=5 Below Average  RBANS Total Score -- Std=87 Low Average  RBANS Immediate Memory -- Std=85 Low Average  RBANS Visuospatial/Constructional -- Std=102 Average  RBANS Language -- Std=105 Average  RBANS Attention -- Std=106 Average  RBANS Delayed Memory -- Std=60 Exceptionally Low  PREMORBID FUNCTIONING RAW  RANGE  TOPF 18 Std=80 Low Average  ATTENTION & PROCESSING SPEED RAW  RANGE  RBANS Attention -- Std=106 Average  Digit Span -- ss=11 Average  Coding -- ss=11 Average  Trails A 53''0e T=36 Below Average  EXECUTIVE FUNCTION RAW  RANGE  Trails B 94''2e T=49 Average  COWAT Letter Fluency 9+8+12 T=42 Low Average  NAB Judgement -- T=63 High Average  LANGUAGE RAW  RANGE  RBANS Language -- Std=105 Average  Picture Naming 9/10 26-50%ile WNL  Semantic Fluency 21 ss=11 Average  COWAT Letter Fluency 9+8+12 T=42 Low Average  NAB Naming Test 31/31 T=60 WNL  VISUOSPATIAL RAW  RANGE  RBANS Visuospatial/Constructional -- Std=102 Average  Figure Copy 19/20 ss=12 High Average  Line Orientation 16/20 26-50%ile Average  Clock Drawing 8/10 -- WNL  VERBAL LEARNING & MEMORY RAW  RANGE  RBANS Word  List -- -- --  List Learning (2+5+5+7)/40 ss=6 Low Average  List Recall 1/10 3-9%ile Below Average  List Recognition 15/20 <2%ile Exceptionally Low  RBANS Story -- -- --  Story Memory (7+8)/24 ss=9 Average  Story Recall 5/12 ss=6 Low Average  Story Recognition  7/12 5-7%ile Below Average  VISUOSPATIAL LEARNING & MEMORY RAW  RANGE  RBANS Figure -- -- --  Figure Copy 19/20 ss=12 High Average  Figure Recall 7/20 ss=6 Low Average  Figure Recognition 6/8 30-52%ile Average  QUESTIONNAIRES RAW  RANGE  GDS-SF 1 -- Minimal  GAS-10 6 -- Minimal  *Note: ss = scaled score; Std = standard score; T = t-score; C/S = corrected raw score; WNL = within normal limits; BNL= below normal limits; D/C = discontinued. Scores from skewed distributions are typically interpreted as WNL (>=16th %ile) or BNL (<16th %ile).  INFORMED CONSENT   Patient was provided with a verbal description of the nature and purpose of the neuropsychological evaluation. Also reviewed were the foreseeable risks and/or discomforts and benefits of the procedure, limits of confidentiality, and mandatory reporting requirements of this provider. Patient was given the opportunity to have their questions answered. Oral consent to participate was provided by the patient.   This report was prepared as part of a clinical evaluation and is not intended for forensic use.  SERVICE   This evaluation was conducted by Annice Pih, Psy.D. In addition to time spent directly with the patient, total professional time includes record review, integration of relevant medical history, test selection, interpretation of findings, and report preparation. A technician, Wallace Keller, B.S., provided testing and scoring assistance for 125 minutes.  Psychiatric Diagnostic Evaluation Services (Professional): 16109 x 1 Neuropsychological Testing Evaluation Services (Professional): 60454 x 1 Neuropsychological Testing Evaluation Services (Professional): 09811 x  1 Neuropsychological Test Administration and Scoring Radiographer, therapeutic): [id number] x 1 Neuropsychological Test Administration and Scoring (Technician): [phone] x 3  This report was generated using voice recognition software. While this document has been carefully reviewed, transcription errors may be present. I apologize in advance for any inconvenience. Please contact me if further clarification is needed.            Annice Pih, Psy.D.             Neuropsychologist

## 2023-06-02 ENCOUNTER — Ambulatory Visit: Payer: Medicare Other | Admitting: Dermatology

## 2023-06-02 ENCOUNTER — Ambulatory Visit (INDEPENDENT_AMBULATORY_CARE_PROVIDER_SITE_OTHER): Payer: Medicare Other | Admitting: Psychology

## 2023-06-02 DIAGNOSIS — R419 Unspecified symptoms and signs involving cognitive functions and awareness: Secondary | ICD-10-CM | POA: Diagnosis not present

## 2023-06-02 NOTE — Progress Notes (Signed)
   NEUROPSYCHOLOGY FEEDBACK SESSION [place]. [facility]  [place] Department of Neurology  Date of Feedback Session: 06/02/2023  REASON FOR REFERRAL   Breleigh Carpino is a 78 year old, right-handed, White female with 14 years of formal education. She was referred for neuropsychological evaluation by her primary care provider, Jarold Motto, PA, to assess current neurocognitive functioning, document potential cognitive deficits, and assist with treatment planning.  FEEDBACK   Patient completed a comprehensive neuropsychological evaluation on 05/26/2023. Please refer to that encounter for the full report and recommendations. Briefly, results suggested a very mild weakness in aspects of verbal memory, with otherwise broadly normal test performance, in the setting of grossly preserved functional independence. Patient does not show evidence of a cognitive disorder at this time. Considering pertinent background information, the etiology of identified weaknesses is likely multifactorial and related to chronic pain and poor sleep.  Today, the patient was accompanied by her husband. They were provided verbal feedback regarding the findings and impression during this visit, and their questions were answered. A copy of the report was provided at the conclusion of the visit.   DISPOSITION   Patient will follow up with the referring provider, Ms. Worley. She should return in 12-18 months to re-evaluate memory and make sure there has not been decline.   SERVICE   This feedback session was conducted by Annice Pih, Psy.D. One unit of [id number] was billed for Dr. Robbie Lis' time spent in preparing, conducting, and documenting the current feedback session.  This report was generated using voice recognition software. While this document has been carefully reviewed, transcription errors may be present. I apologize in advance for any inconvenience. Please contact me if further clarification is needed.

## 2023-06-11 ENCOUNTER — Encounter: Payer: Self-pay | Admitting: Dermatology

## 2023-06-11 ENCOUNTER — Ambulatory Visit: Admitting: Dermatology

## 2023-06-11 VITALS — BP 120/68

## 2023-06-11 DIAGNOSIS — Z1283 Encounter for screening for malignant neoplasm of skin: Secondary | ICD-10-CM

## 2023-06-11 DIAGNOSIS — D229 Melanocytic nevi, unspecified: Secondary | ICD-10-CM

## 2023-06-11 DIAGNOSIS — L820 Inflamed seborrheic keratosis: Secondary | ICD-10-CM

## 2023-06-11 DIAGNOSIS — L578 Other skin changes due to chronic exposure to nonionizing radiation: Secondary | ICD-10-CM

## 2023-06-11 DIAGNOSIS — D485 Neoplasm of uncertain behavior of skin: Secondary | ICD-10-CM

## 2023-06-11 DIAGNOSIS — D3617 Benign neoplasm of peripheral nerves and autonomic nervous system of trunk, unspecified: Secondary | ICD-10-CM | POA: Diagnosis not present

## 2023-06-11 DIAGNOSIS — L814 Other melanin hyperpigmentation: Secondary | ICD-10-CM

## 2023-06-11 DIAGNOSIS — W908XXA Exposure to other nonionizing radiation, initial encounter: Secondary | ICD-10-CM | POA: Diagnosis not present

## 2023-06-11 DIAGNOSIS — L821 Other seborrheic keratosis: Secondary | ICD-10-CM

## 2023-06-11 DIAGNOSIS — D1801 Hemangioma of skin and subcutaneous tissue: Secondary | ICD-10-CM

## 2023-06-11 DIAGNOSIS — D492 Neoplasm of unspecified behavior of bone, soft tissue, and skin: Secondary | ICD-10-CM

## 2023-06-11 NOTE — Patient Instructions (Addendum)

## 2023-06-11 NOTE — Progress Notes (Signed)
 New Patient Visit   Subjective  Heather Hodges is a 78 y.o. female who presents for the following: Skin Cancer Screening and Upper Body Skin Exam - No history of skin cancer. No family history of Melanoma. She has spots on her right cheek and left temple that have been there for a while.  The patient presents for Upper Body Skin Exam (UBSE) for skin cancer screening and mole check. The patient has spots, moles and lesions to be evaluated, some may be new or changing.  The following portions of the chart were reviewed this encounter and updated as appropriate: medications, allergies, medical history  Review of Systems:  No other skin or systemic complaints except as noted in HPI or Assessment and Plan.  Objective  Well appearing patient in no apparent distress; mood and affect are within normal limits.  All skin waist up examined. Relevant physical exam findings are noted in the Assessment and Plan.  Left Temple Erythematous stuck-on, waxy papule or plaque Left Lower Back 4 mm pink papule   Assessment & Plan   INFLAMED SEBORRHEIC KERATOSIS Left Temple Symptomatic, irritating, patient would like treated.  Benign-appearing.  Call clinic for new or changing lesions.   Destruction of lesion - Left Temple Complexity: simple   Destruction method: cryotherapy   Informed consent: discussed and consent obtained   Timeout:  patient name, date of birth, surgical site, and procedure verified Lesion destroyed using liquid nitrogen: Yes   Region frozen until ice ball extended beyond lesion: Yes   Outcome: patient tolerated procedure well with no complications   Post-procedure details: wound care instructions given   NEOPLASM OF UNCERTAIN BEHAVIOR OF SKIN Left Lower Back Epidermal / dermal shaving  Lesion diameter (cm):  0.4 Informed consent: discussed and consent obtained   Timeout: patient name, date of birth, surgical site, and procedure verified   Procedure prep:  Patient was  prepped and draped in usual sterile fashion Prep type:  Isopropyl alcohol Anesthesia: the lesion was anesthetized in a standard fashion   Anesthetic:  1% lidocaine w/ epinephrine 1-100,000 buffered w/ 8.4% NaHCO3 Instrument used: flexible razor blade   Hemostasis achieved with: pressure, aluminum chloride and electrodesiccation   Outcome: patient tolerated procedure well   Post-procedure details: sterile dressing applied and wound care instructions given   Dressing type: bandage and petrolatum   Specimen 1 - Surgical pathology Differential Diagnosis: Neurofibroma vs Irritated nevus  Check Margins: No Skin cancer screening performed today.  Actinic Damage - Chronic condition, secondary to cumulative UV/sun exposure - diffuse scaly erythematous macules with underlying dyspigmentation - Recommend daily broad spectrum sunscreen SPF 30+ to sun-exposed areas, reapply every 2 hours as needed.  - Staying in the shade or wearing long sleeves, sun glasses (UVA+UVB protection) and wide brim hats (4-inch brim around the entire circumference of the hat) are also recommended for sun protection.  - Call for new or changing lesions.  Lentigines, Seborrheic Keratoses, Hemangiomas - Benign normal skin lesions - Benign-appearing - Call for any changes  Melanocytic Nevi - Tan-brown and/or pink-flesh-colored symmetric macules and papules - Benign appearing on exam today - Observation - Call clinic for new or changing moles - Recommend daily use of broad spectrum spf 30+ sunscreen to sun-exposed areas.    Return if symptoms worsen or fail to improve.  I, Joanie Coddington, CMA, am acting as scribe for Gwenith Daily, MD .   Documentation: I have reviewed the above documentation for accuracy and completeness, and I agree  with the above.  Gwenith Daily, MD

## 2023-06-12 LAB — SURGICAL PATHOLOGY

## 2023-06-30 ENCOUNTER — Ambulatory Visit: Admitting: Family Medicine

## 2023-06-30 ENCOUNTER — Other Ambulatory Visit: Payer: Self-pay

## 2023-06-30 VITALS — BP 132/72 | HR 71 | Ht 60.0 in | Wt 126.0 lb

## 2023-06-30 DIAGNOSIS — M25561 Pain in right knee: Secondary | ICD-10-CM

## 2023-06-30 DIAGNOSIS — J302 Other seasonal allergic rhinitis: Secondary | ICD-10-CM | POA: Diagnosis not present

## 2023-06-30 DIAGNOSIS — G8929 Other chronic pain: Secondary | ICD-10-CM

## 2023-06-30 DIAGNOSIS — M542 Cervicalgia: Secondary | ICD-10-CM

## 2023-06-30 DIAGNOSIS — M810 Age-related osteoporosis without current pathological fracture: Secondary | ICD-10-CM

## 2023-06-30 MED ORDER — ALENDRONATE SODIUM 70 MG PO TABS: 70.0000 mg | ORAL_TABLET | ORAL | 11 refills | Status: AC

## 2023-06-30 NOTE — Patient Instructions (Addendum)
 Thank you for coming in today.   You received an injection today. Seek immediate medical attention if the joint becomes red, extremely painful, or is oozing fluid.   Check back as needed  If the neck hurts bad enough, follow-up with Dr. Valentina Shaggy, Allegra daily. Consider Flonase

## 2023-06-30 NOTE — Progress Notes (Signed)
 [name], PhD, LAT, ATC acting as a scribe for [name], MD.  Heather Hodges is a 78 y.o. female who presents to [facility] today for exacerbation of her R knee pain. Last visit for her knee was on 12/31/22 and she was given a R knee steroid injection.  Today, pt reports pain and post-concussion symptoms have worsened over the last 2 days. Pt locates pain to the R-side of her neck w/ dizziness and HA. R knee pain also exacerbated over the last 2 days. She notes swelling in bilat ankles and pain above her R eye.   She notes continued neck pain especially on the right side.  She had trials of facet ablations C-spine with Dr. Lorrine Kin.  Left-sided facet ablations went well.  Right side did not help much..  Patient is not sure her alendronate has been stopped for osteoporosis.  She notes that she has taken it and did tolerated okay.  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  Dx testing: 12/24/22 R knee XR & labs             03/11/22 R knee XR             11/14/20 R knee XR  Pertinent review of systems: No fevers or chills  Relevant historical information: Posttraumatic pneumothorax.  Cerebral contusion.  Chronic pain.  Osteoporosis.   Exam:  BP 132/72   Pulse 71   Ht 5' (1.524 m)   Wt 126 lb (57.2 kg)   SpO2 92%   BMI 24.61 kg/m  General: Well Developed, well nourished, and in no acute distress.   MSK: C-spine nontender palpation midline.  Tender palpation cervical paraspinal musculature. Decree cervical motion.  Right knee mild effusion normal motion with crepitation.  Mildly tender to palpation.  Intact strength.  HEENT: Mild nasal congestion present.    Lab and Radiology Results  Procedure: Real-time Ultrasound Guided Injection of right knee joint superior lateral patella space Device: Philips Affiniti 50G/GE Logiq Images permanently stored and available for review in PACS Verbal informed  consent obtained.  Discussed risks and benefits of procedure. Warned about infection, bleeding, hyperglycemia damage to structures among others. Patient expresses understanding and agreement Time-out conducted.   Noted no overlying erythema, induration, or other signs of local infection.   Skin prepped in a sterile fashion.   Local anesthesia: Topical Ethyl chloride.   With sterile technique and under real time ultrasound guidance: 40 mg of Kenalog and 2 mL of Marcaine injected into knee joint. Fluid seen entering the joint capsule.   Completed without difficulty   Pain immediately resolved suggesting accurate placement of the medication.   Advised to call if fevers/chills, erythema, induration, drainage, or persistent bleeding.   Images permanently stored and available for review in the ultrasound unit.  Impression: Technically successful ultrasound guided injection.        Assessment and Plan: 78 y.o. 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  Plan for steroid injection.  Chronic neck pain.  This is a long-term chronic problem that has been difficult to control.  She has had medial branch block and ablations with that worked in the left but not on the right.  Not much left else to do.  It may be worthwhile talking to Dr. Lorrine Kin again to see if there is other procedures that she could have.  Osteoporosis okay to continue alendronate.  Nasal congestion  suspect seasonal allergies.  Recommend over-the-counter nonsedating antihistamine like Zyrtec or Allegra and potentially Flonase nasal spray.   PDMP not reviewed this encounter. Orders Placed This Encounter  Procedures   [place] LIMITED JOINT SPACE STRUCTURES LOW RIGHT(NO LINKED CHARGES)    Reason for Exam (SYMPTOM  OR DIAGNOSIS REQUIRED):   right knee pain    Preferred imaging location?:   [facility] ordered this encounter  Medications   alendronate (FOSAMAX) 70 MG tablet    Sig: Take 1  tablet (70 mg total) by mouth every 7 (seven) days.    Dispense:  12 tablet    Refill:  11     Discussed warning signs or symptoms. Please see discharge instructions. Patient expresses understanding.   The above documentation has been reviewed and is accurate and complete [name], M.D.

## 2023-07-16 ENCOUNTER — Other Ambulatory Visit: Payer: Self-pay | Admitting: Physician Assistant

## 2023-07-16 NOTE — Telephone Encounter (Signed)
 Copied from CRM [id number]. Topic: Clinical - Medication Refill >> Jul 16, 2023  1:27 PM Freya Jesus wrote: Most Recent Primary Care Visit:  Provider: WORLEY, SAMANTHA  Department: [facility]  Visit Type: OFFICE VISIT  Date: 05/06/2023  Medication: levothyroxine (SYNTHROID) 88 MCG tablet [[id number]]  Has the patient contacted their pharmacy? No (Agent: If no, request that the patient contact the pharmacy for the refill. If patient does not wish to contact the pharmacy document the reason why and proceed with request.) (Agent: If yes, when and what did the pharmacy advise?)  Is this the correct pharmacy for this prescription? Yes If no, delete pharmacy and type the correct one.  This is the patient's preferred pharmacy:  [facility] - [place], [place] - [address] [place] [address] Phone: [phone] Fax: [phone]    Has the prescription been filled recently? No  Is the patient out of the medication? Yes  Has the patient been seen for an appointment in the last year OR does the patient have an upcoming appointment? Yes  Can we respond through MyChart? No  Agent: Please be advised that Rx refills may take up to 3 business days. We ask that you follow-up with your pharmacy.

## 2023-07-20 ENCOUNTER — Telehealth: Payer: Self-pay | Admitting: Physician Assistant

## 2023-07-20 ENCOUNTER — Telehealth: Payer: Self-pay | Admitting: *Deleted

## 2023-07-20 MED ORDER — LEVOTHYROXINE SODIUM 88 MCG PO TABS: 88.0000 ug | ORAL_TABLET | ORAL | 0 refills | Status: DC

## 2023-07-20 MED ORDER — LEVOTHYROXINE SODIUM 88 MCG PO TABS: 88.0000 ug | ORAL_TABLET | ORAL | 1 refills | Status: DC

## 2023-07-20 NOTE — Telephone Encounter (Signed)
 [facility] to [facility] for [facility] month supply, 90 day supply sent to [facility] earlier today.

## 2023-07-20 NOTE — Telephone Encounter (Signed)
 Prescription Request  07/20/2023  LOV: 05/06/2023  What is the name of the medication or equipment? levothyroxine  (SYNTHROID ) 88 MCG tablet   Have you contacted your pharmacy to request a refill? No   Which pharmacy would you like this sent to? [id number] [place], [place] - [address] [place] [address] Phone: [phone] Fax: [phone]   Patient notified that their request is being sent to the clinical staff for review and that they should receive a response within 2 business days.   Please advise at Mobile [phone] (mobile)

## 2023-07-20 NOTE — Telephone Encounter (Signed)
 Copied from CRM [id number]. Topic: Clinical - Medication Refill >> Jul 16, 2023  1:27 PM Freya Jesus wrote: Most Recent Primary Care Visit:  Provider: Alexander Iba  Department: [facility]  Visit Type: OFFICE VISIT  Date: 05/06/2023  Medication: levothyroxine  (SYNTHROID ) 88 MCG tablet [[id number]]  Has the patient contacted their pharmacy? No (Agent: If no, request that the patient contact the pharmacy for the refill. If patient does not wish to contact the pharmacy document the reason why and proceed with request.) (Agent: If yes, when and what did the pharmacy advise?)  Is this the correct pharmacy for this prescription? Yes If no, delete pharmacy and type the correct one.  This is the patient's preferred pharmacy:  [facility] - [place], [place] - [address] [place] [address] Phone: [phone] Fax: [phone]    Has the prescription been filled recently? No  Is the patient out of the medication? Yes  Has the patient been seen for an appointment in the last year OR does the patient have an upcoming appointment? Yes  Can we respond through MyChart? No  Agent: Please be advised that Rx refills may take up to 3 business days. We ask that you follow-up with your pharmacy. >> Jul 20, 2023  8:18 AM Emylou G wrote: Patient called.. only has a few left.. checking status of synthroid .. number on file good

## 2023-07-20 NOTE — Telephone Encounter (Signed)
 Spoke to pt told her I sent Rx for Levothyroxine  88 mcg to [facility]. Told her need to call and schedule an appt for physical and lab work you are overdue. Pt verbalized understanding and will call back to schedule.

## 2023-08-26 ENCOUNTER — Encounter: Payer: Self-pay | Admitting: Physician Assistant

## 2023-08-26 ENCOUNTER — Ambulatory Visit (INDEPENDENT_AMBULATORY_CARE_PROVIDER_SITE_OTHER): Admitting: Physician Assistant

## 2023-08-26 VITALS — BP 134/70 | HR 75 | Temp 97.3°F | Ht 60.0 in | Wt 131.2 lb

## 2023-08-26 DIAGNOSIS — I878 Other specified disorders of veins: Secondary | ICD-10-CM | POA: Diagnosis not present

## 2023-08-26 DIAGNOSIS — E039 Hypothyroidism, unspecified: Secondary | ICD-10-CM | POA: Diagnosis not present

## 2023-08-26 DIAGNOSIS — M1990 Unspecified osteoarthritis, unspecified site: Secondary | ICD-10-CM

## 2023-08-26 DIAGNOSIS — E785 Hyperlipidemia, unspecified: Secondary | ICD-10-CM

## 2023-08-26 DIAGNOSIS — M810 Age-related osteoporosis without current pathological fracture: Secondary | ICD-10-CM

## 2023-08-26 LAB — CBC WITH DIFFERENTIAL/PLATELET
Basophils Absolute: 0 10*3/uL (ref 0.0–0.1)
Basophils Relative: 0.7 % (ref 0.0–3.0)
Eosinophils Absolute: 0 10*3/uL (ref 0.0–0.7)
Eosinophils Relative: 0.7 % (ref 0.0–5.0)
HCT: 39.3 % (ref 36.0–46.0)
Hemoglobin: 13 g/dL (ref 12.0–15.0)
Lymphocytes Relative: 37.9 % (ref 12.0–46.0)
Lymphs Abs: 2.7 10*3/uL (ref 0.7–4.0)
MCHC: 33.2 g/dL (ref 30.0–36.0)
MCV: 92.8 fl (ref 78.0–100.0)
Monocytes Absolute: 0.6 10*3/uL (ref 0.1–1.0)
Monocytes Relative: 8.4 % (ref 3.0–12.0)
Neutro Abs: 3.7 10*3/uL (ref 1.4–7.7)
Neutrophils Relative %: 52.3 % (ref 43.0–77.0)
Platelets: 291 10*3/uL (ref 150.0–400.0)
RBC: 4.23 Mil/uL (ref 3.87–5.11)
RDW: 13.6 % (ref 11.5–15.5)
WBC: 7.1 10*3/uL (ref 4.0–10.5)

## 2023-08-26 LAB — LIPID PANEL
Cholesterol: 356 mg/dL — ABNORMAL HIGH (ref 0–200)
HDL: 43.9 mg/dL (ref >39.00)
NonHDL: 312.25
Total CHOL/HDL Ratio: 8
Triglycerides: 463 mg/dL — ABNORMAL HIGH (ref 0.0–149.0)
VLDL: 92.6 mg/dL — ABNORMAL HIGH (ref 0.0–40.0)

## 2023-08-26 LAB — COMPREHENSIVE METABOLIC PANEL WITH GFR
ALT: 17 U/L (ref 0–35)
AST: 21 U/L (ref 0–37)
Albumin: 4.2 g/dL (ref 3.5–5.2)
Alkaline Phosphatase: 59 U/L (ref 39–117)
BUN: 8 mg/dL (ref 6–23)
CO2: 30 meq/L (ref 19–32)
Calcium: 9.1 mg/dL (ref 8.4–10.5)
Chloride: 101 meq/L (ref 96–112)
Creatinine, Ser: 0.74 mg/dL (ref 0.40–1.20)
GFR: 77.08 mL/min (ref >60.00)
Glucose, Bld: 79 mg/dL (ref 70–99)
Potassium: 4.1 meq/L (ref 3.5–5.1)
Sodium: 138 meq/L (ref 135–145)
Total Bilirubin: 0.5 mg/dL (ref 0.2–1.2)
Total Protein: 7.3 g/dL (ref 6.0–8.3)

## 2023-08-26 LAB — TSH: TSH: 4.71 u[IU]/mL (ref 0.35–5.50)

## 2023-08-26 LAB — LDL CHOLESTEROL, DIRECT: Direct LDL: 214 mg/dL

## 2023-08-26 NOTE — Progress Notes (Signed)
 Subjective:    Heather Hodges is a 78 y.o. female and is here for a comprehensive physical exam.  HPI  Health Maintenance Due  Topic Date Due   Medicare Annual Wellness (AWV)  11/30/2021   Pt repor hesitation to return to doctor's office due to feeling worse overall. She thinks about staying home instead due to her chronic issues: arthritis, dizziness and pain on top of her head, the "backpack feeling," and sharp pains in her abd LUQ that radiate to her back.  Acute Concerns: None.  Chronic Issues: Prominent veins Pt complains of swelling in both her legs, with discomfort particularly in her ankles. She reports veins on her legs popping out and bilateral knee pain when bending, especially in the right knee. Endorses wearing compression socks and exercising/stretching with difficulty due to pain. Denies heavy/crampy pain when walking.  Osteoporosis Pt is on Alendronate  70 mg once weekly. Good compliance and tolerance since she last saw Dr. Alease Hunter. She recently started this with him in April  Hyperlipidemia She was previously on Lipitor 80 mg but we stopped this to see if it was contributing to her pain Her pain persists despite stopping this medication  Hypothyroidism She currently takes regimen of levothyroxine  75 mcg alternating with 88 mcg every other day She is compliant with this regimen  Health Maintenance: Immunizations -- UTD Colonoscopy -- Last done 10/2015 at [facility] -- discontinued due to age Mammogram -- Last done 04/30/2023, result show no mammographic evidence of malignancy in either breast. 1 year repeat recommended, next due 03/2024. PAP -- N/a Bone Density -- Last done 02/05/2023, results show osteoporosis. Recommended 2-3 year repeat. Diet -- Overall healthy diet. Exercise -- Good exercise habits reported.  Sleep habits -- Good sleeping habits reported. Mood -- Stable.  UTD with dentist? - UTD UTD with eye doctor? - UTD  Weight  history: Wt Readings from Last 10 Encounters:  08/26/23 131 lb 4 oz (59.5 kg)  06/30/23 126 lb (57.2 kg)  05/06/23 130 lb 4 oz (59.1 kg)  04/07/23 131 lb (59.4 kg)  03/16/23 131 lb 6 oz (59.6 kg)  02/05/23 131 lb (59.4 kg)  01/26/23 129 lb 9.6 oz (58.8 kg)  12/31/22 120 lb (54.4 kg)  12/30/22 132 lb 6.1 oz (60 kg)  12/24/22 133 lb (60.3 kg)   Body mass index is 25.63 kg/m. No LMP recorded. Patient is postmenopausal.  Alcohol use:  reports that she does not currently use alcohol.  Tobacco use:  Tobacco Use: Low Risk  (08/26/2023)   Patient History    Smoking Tobacco Use: Never    Smokeless Tobacco Use: Never    Passive Exposure: Past   Eligible for lung cancer screening? no     08/26/2023    8:46 AM  Depression screen PHQ 2/9  Decreased Interest 0  Down, Depressed, Hopeless 0  PHQ - 2 Score 0     Other providers/specialists: Patient Care Team: Alexander Iba, [place] as PCP - General (Physician Assistant) Durenda Gilford., MD as Referring Physician (Neurology) Devon Fogo, MD (Inactive) as Consulting Physician (Dermatology) Annis Kinder, MD as Consulting Physician (Pain Medicine)    PMHx, SurgHx, SocialHx, Medications, and Allergies were reviewed in the Visit Navigator and updated as appropriate.   Past Medical History:  Diagnosis Date   Arthritis    Cataract    Colon polyps    Diverticulosis    Fibromyalgia    History of chicken pox    Hyperlipidemia  Hypothyroidism    Osteoporosis    Sjogren's disease (HCC)    Vitamin D  deficiency      Past Surgical History:  Procedure Laterality Date   BREAST BIOPSY Left    Benign   CARPAL TUNNEL RELEASE Bilateral    TONSILLECTOMY AND ADENOIDECTOMY     TRIGGER FINGER RELEASE Right    Thumb     Family History  Problem Relation Age of Onset   Arthritis Mother    Breast cancer Mother        24s-70s   High Cholesterol Mother    Dementia Mother    Lung cancer Mother    Heart attack Father    High  Cholesterol Father    Dementia Father    Arthritis Brother    Hearing loss Brother    Hypercholesterolemia Brother    Heart Problems Son    Colon cancer Neg Hx    Colon polyps Neg Hx    Esophageal cancer Neg Hx    Rectal cancer Neg Hx    Stomach cancer Neg Hx     Social History   Tobacco Use   Smoking status: Never    Passive exposure: Past   Smokeless tobacco: Never  Vaping Use   Vaping status: Never Used  Substance Use Topics   Alcohol use: Not Currently   Drug use: Never    Review of Systems:   Review of Systems  Constitutional:  Negative for chills, fever, malaise/fatigue and weight loss.  HENT:  Negative for hearing loss, sinus pain and sore throat.   Respiratory:  Negative for cough and hemoptysis.   Cardiovascular:  Negative for chest pain, palpitations, leg swelling and PND.  Gastrointestinal:  Negative for abdominal pain, constipation, diarrhea, heartburn, nausea and vomiting.  Genitourinary:  Negative for dysuria, frequency and urgency.  Musculoskeletal:  Positive for back pain, joint pain, myalgias and neck pain.  Skin:  Negative for itching and rash.  Neurological:  Negative for dizziness, tingling, seizures and headaches.  Endo/Heme/Allergies:  Negative for polydipsia.  Psychiatric/Behavioral:  Negative for depression. The patient is not nervous/anxious.       Objective:   BP 134/70 (BP Location: Left Arm, Patient Position: Sitting, Cuff Size: Normal)   Pulse 75   Temp (!) 97.3 F (36.3 C) (Temporal)   Ht 5' (1.524 m)   Wt 131 lb 4 oz (59.5 kg)   SpO2 95%   BMI 25.63 kg/m  Body mass index is 25.63 kg/m.   General Appearance:    Alert, cooperative, no distress, appears stated age  Head:    Normocephalic, without obvious abnormality, atraumatic  Eyes:    PERRL, conjunctiva/corneas clear, EOM's intact, fundi    benign, both eyes  Ears:    Normal TM's and external ear canals, both ears  Nose:   Nares normal, septum midline, mucosa normal, no  drainage    or sinus tenderness  Throat:   Lips, mucosa, and tongue normal; teeth and gums normal  Neck:   Supple, symmetrical, trachea midline, no adenopathy;    thyroid :  no enlargement/tenderness/nodules; no carotid   bruit or JVD  Back:     Symmetric, no curvature, ROM normal, no CVA tenderness  Lungs:     Clear to auscultation bilaterally, respirations unlabored  Chest Wall:    No tenderness or deformity   Heart:    Regular rate and rhythm, S1 and S2 normal, no murmur, rub or gallop  Breast Exam:    Deferred  Abdomen:  Soft, non-tender, bowel sounds active all four quadrants,    no masses, no organomegaly  Genitalia:    Deferred  Extremities:   Extremities normal, atraumatic, no cyanosis or edema  Pulses:   2+ and symmetric all extremities  Skin:   Skin color, texture, turgor normal, no rashes or lesions  Lymph nodes:   Cervical, supraclavicular, and axillary nodes normal  Neurologic:   CNII-XII intact, normal strength, sensation and reflexes    throughout    Assessment/Plan:   Arthritis Ongoing, chronic issue She has seen multiple providers for this issue She states that she is okay with her current symptoms and knows that if they worsen that she can always reach out to them She is taking Tylenol as needed for her pain She will also occasionally take a 7.5 mg meloxicam  if needed  Hyperlipidemia, unspecified hyperlipidemia type We have previously held her statin to see if this was contributing to her pain She is agreeable to restarting based on her blood work Update today  Acquired hypothyroidism TSH and adjust current regimen of levothyroxine  75 mcg and 88 mcg alternating daily  Prominent vein Denies any symptoms other than cosmetic Offered ultrasound evaluation which she declined at this time I recommend that she continue to use compression stockings and elevate legs when able If symptoms worsen, I asked her to reach out so we can obtain imaging and advise on neck  steps  Age-related osteoporosis without current pathological fracture She has resumed her Fosamax  We will update her DEXA when time Continue Fosamax   I advised her to follow-up in 6 months on all issues, sooner if concerns   I, Timoteo Force, acting as a Neurosurgeon for [employer], [place]., have documented all relevant documentation on the behalf of Alexander Iba, [place], as directed by  Alexander Iba, PA while in the presence of Alexander Iba, [place].  I, Darl Edu, have reviewed all documentation for this visit. The documentation on 08/26/23 for the exam, diagnosis, procedures, and orders are all accurate and complete.  Alexander Iba, PA-C [facility]

## 2023-08-26 NOTE — Patient Instructions (Addendum)
 It was great to see you!  Please continue to follow up with your specialists as you already doing so  If you develop pain in your legs or worsening swelling of legs -- please let me know so we can order ultrasound of your legs to assess your blood flow  Let's follow-up in 6 months, sooner if you have concerns.  Take care,  Alexander Iba PA-C

## 2023-08-27 ENCOUNTER — Ambulatory Visit: Payer: Self-pay | Admitting: Physician Assistant

## 2023-08-27 MED ORDER — ATORVASTATIN CALCIUM 80 MG PO TABS: 80.0000 mg | ORAL_TABLET | Freq: Every day | ORAL | 3 refills | Status: AC

## 2023-09-07 ENCOUNTER — Institutional Professional Consult (permissible substitution): Payer: Medicare Other | Admitting: Psychology

## 2023-09-07 ENCOUNTER — Ambulatory Visit: Payer: Self-pay

## 2023-09-14 ENCOUNTER — Encounter: Payer: Medicare Other | Admitting: Psychology

## 2023-09-30 ENCOUNTER — Other Ambulatory Visit: Payer: Self-pay | Admitting: Nurse Practitioner

## 2023-11-08 ENCOUNTER — Other Ambulatory Visit: Payer: Self-pay | Admitting: Physician Assistant

## 2024-01-04 ENCOUNTER — Ambulatory Visit (INDEPENDENT_AMBULATORY_CARE_PROVIDER_SITE_OTHER)

## 2024-01-04 VITALS — Ht 60.0 in | Wt 131.0 lb

## 2024-01-04 DIAGNOSIS — Z0000 Encounter for general adult medical examination without abnormal findings: Secondary | ICD-10-CM

## 2024-01-04 DIAGNOSIS — M5412 Radiculopathy, cervical region: Secondary | ICD-10-CM | POA: Insufficient documentation

## 2024-01-04 NOTE — Progress Notes (Signed)
 Subjective:   Heather Hodges is a 78 y.o. who presents for a Medicare Wellness preventive visit.  As a reminder, Annual Wellness Visits don't include a physical exam, and some assessments may be limited, especially if this visit is performed virtually. We may recommend an in-person follow-up visit with your provider if needed.  Visit Complete: Virtual I connected with  Heather Hodges on 01/04/24 by a audio enabled telemedicine application and verified that I am speaking with the correct person using two identifiers.  Patient Location: Home  Provider Location: Office/Clinic  I discussed the limitations of evaluation and management by telemedicine. The patient expressed understanding and agreed to proceed.  Vital Signs: Because this visit was a virtual/telehealth visit, some criteria may be missing or patient reported. Any vitals not documented were not able to be obtained and vitals that have been documented are patient reported.  VideoDeclined- This patient declined Librarian, academic. Therefore the visit was completed with audio only.  Persons Participating in Visit: Patient.  AWV Questionnaire: No: Patient Medicare AWV questionnaire was not completed prior to this visit.  Cardiac Risk Factors include: advanced age (>26men, >92 women);dyslipidemia     Objective:    Today's Vitals   01/04/24 0843 01/04/24 0845  Weight: 131 lb (59.4 kg)   Height: 5' (1.524 m)   PainSc:  8    Body mass index is 25.58 kg/m.     01/04/2024    8:59 AM 08/19/2022    9:56 AM 08/02/2022   10:07 AM 04/15/2022    3:15 PM 11/30/2020    8:13 AM 11/26/2020   11:21 AM  Advanced Directives  Does Patient Have a Medical Advance Directive? Yes Yes Yes Yes Yes No  Type of Estate agent of [place];Living will Healthcare Power of [place];Living will Living will;Out of facility DNR (pink MOST or yellow form);Healthcare Power of [employer] of  [place];Living will Living will   Does patient want to make changes to medical advance directive?    No - Patient declined    Copy of Healthcare Power of Attorney in Chart? No - copy requested   No - copy requested    Would patient like information on creating a medical advance directive?      No - Patient declined    Current Medications (verified) Outpatient Encounter Medications as of 01/04/2024  Medication Sig   acetaminophen (TYLENOL 8 HOUR ARTHRITIS PAIN) 650 MG CR tablet Take 650 mg by mouth every 8 (eight) hours as needed for pain.   alendronate  (FOSAMAX ) 70 MG tablet Take 1 tablet (70 mg total) by mouth every 7 (seven) days.   Artificial Saliva (BIOTENE DRY MOUTH) LOZG Use as directed in the mouth or throat as needed.   atorvastatin  (LIPITOR) 80 MG tablet Take 1 tablet (80 mg total) by mouth daily.   Calcium  Carbonate-Vitamin D  (CALCIUM  500+D PO) Take 2 each by mouth every other day.   famotidine  (PEPCID ) 20 MG tablet TAKE 1 TABLET BY MOUTH EVERY DAY   levothyroxine  (SYNTHROID ) 75 MCG tablet Take 1 tablet (75 mcg total) by mouth every other day.   levothyroxine  (SYNTHROID ) 88 MCG tablet TAKE 1 TABLET (88 MCG TOTAL) BY MOUTH EVERY OTHER DAY.   lidocaine (XYLOCAINE) 4 % external solution Apply topically.   Lidocaine 4 % PTCH Place onto the skin.   Multiple Vitamin (MULTIVITAMIN) capsule Take 1 capsule by mouth every other day.   Omega-3 Fatty Acids (OMEGA 3 500 PO) Take 1  capsule by mouth daily in the afternoon.   SYSTANE ULTRA 0.4-0.3 % SOLN SMARTSIG:1 Drop(s) In Eye(s) PRN   dimenhyDRINATE (DRAMAMINE PO) Take by mouth as needed.   ketoconazole  (NIZORAL ) 2 % cream Apply to affected area 1-2 times daily (Patient taking differently: as needed. Apply to affected area 1-2 times daily)   meloxicam  (MOBIC ) 7.5 MG tablet Take 1 tablet (7.5 mg total) by mouth daily.   OVER THE COUNTER MEDICATION Apply topically as needed. Hempvana   [DISCONTINUED] amoxicillin (AMOXIL) 500 MG capsule Take 500  mg by mouth 3 (three) times daily.   No facility-administered encounter medications on file as of 01/04/2024.    Allergies (verified) Risedronate sodium   History: Past Medical History:  Diagnosis Date   Arthritis    Cataract    Colon polyps    Diverticulosis    Fibromyalgia    History of chicken pox    Hyperlipidemia    Hypothyroidism    Osteoporosis    Sjogren's disease    Vitamin D  deficiency    Past Surgical History:  Procedure Laterality Date   BREAST BIOPSY Left    Benign   CARPAL TUNNEL RELEASE Bilateral    TONSILLECTOMY AND ADENOIDECTOMY     TRIGGER FINGER RELEASE Right    Thumb   Family History  Problem Relation Age of Onset   Arthritis Mother    Breast cancer Mother        13s-70s   High Cholesterol Mother    Dementia Mother    Lung cancer Mother    Heart attack Father    High Cholesterol Father    Dementia Father    Arthritis Brother    Hearing loss Brother    Hypercholesterolemia Brother    Heart Problems Son    Colon cancer Neg Hx    Colon polyps Neg Hx    Esophageal cancer Neg Hx    Rectal cancer Neg Hx    Stomach cancer Neg Hx    Social History   Socioeconomic History   Marital status: Married    Spouse name: Heather Hodges   Number of children: 1   Years of education: 14   Highest education level: Associate degree: academic program  Occupational History   Occupation: retired  Tobacco Use   Smoking status: Never    Passive exposure: Past   Smokeless tobacco: Never  Vaping Use   Vaping status: Never Used  Substance and Sexual Activity   Alcohol use: Not Currently   Drug use: Never   Sexual activity: Not on file  Other Topics Concern   Not on file  Social History Narrative   Married   Retired -- Statistician and nanny   1 son -- lives in [place]   Social Drivers of Health   Financial Resource Strain: Low Risk  (01/04/2024)   Overall Financial Resource Strain (CARDIA)    Difficulty of Paying Living Expenses: Not hard at all   Food Insecurity: No Food Insecurity (01/04/2024)   Hunger Vital Sign    Worried About Running Out of Food in the Last Year: Never true    Ran Out of Food in the Last Year: Never true  Transportation Needs: No Transportation Needs (01/04/2024)   PRAPARE - Administrator, Civil Service (Medical): No    Lack of Transportation (Non-Medical): No  Physical Activity: Inactive (01/04/2024)   Exercise Vital Sign    Days of Exercise per Week: 0 days    Minutes of Exercise per  Session: 0 min  Stress: No Stress Concern Present (01/04/2024)   [employer] of Occupational Health - Occupational Stress Questionnaire    Feeling of Stress: Only a little  Social Connections: Moderately Integrated (01/04/2024)   Social Connection and Isolation Panel    Frequency of Communication with Friends and Family: More than three times a week    Frequency of Social Gatherings with Friends and Family: Three times a week    Attends Religious Services: More than 4 times per year    Active Member of Clubs or Organizations: No    Attends Banker Meetings: Never    Marital Status: Married    Tobacco Counseling Counseling given: Not Answered    Clinical Intake:  Pre-visit preparation completed: Yes  Pain : 0-10 Pain Score: 8  Pain Type: Chronic pain Pain Location: Generalized (joints) Pain Descriptors / Indicators: Aching, Nagging (nerves) Pain Onset: More than a month ago Pain Frequency: Intermittent     BMI - recorded: 25.58 Nutritional Status: BMI 25 -29 Overweight Diabetes: No  No results found for: HGBA1C   How often do you need to have someone help you when you read instructions, pamphlets, or other written materials from your doctor or pharmacy?: 1 - Never  Interpreter Needed?: No  Information entered by :: Ellouise Haws, LPN   Activities of Daily Living     01/04/2024    8:51 AM  In your present state of health, do you have any difficulty performing the  following activities:  Hearing? 0  Vision? 0  Difficulty concentrating or making decisions? 0  Walking or climbing stairs? 1  Comment take my time  Dressing or bathing? 0  Doing errands, shopping? 0  Preparing Food and eating ? N  Using the Toilet? N  In the past six months, have you accidently leaked urine? N  Do you have problems with loss of bowel control? N  Managing your Medications? N  Managing your Finances? Y  Comment husband  Housekeeping or managing your Housekeeping? N    Patient Care Team: Job Lukes, [place] as PCP - General (Physician Assistant) Leopoldo Camellia DASEN., MD as Referring Physician (Neurology) Livingston Rigg, MD as Consulting Physician (Dermatology) Darlis Deatrice RAMAN, MD as Consulting Physician (Pain Medicine)  I have updated your Care Teams any recent Medical Services you may have received from other providers in the past year.     Assessment:   This is a routine wellness examination for [place].  Hearing/Vision screen Hearing Screening - Comments:: Pt denies any hearing issues  Vision Screening - Comments:: Wears rx glasses - up to date with routine eye exams with [facility]    Goals Addressed             This Visit's Progress    Patient Stated       Maintain health as best as I can        Depression Screen     01/04/2024    8:53 AM 08/26/2023    8:46 AM 05/06/2023    9:47 AM 12/30/2022    8:45 AM 11/27/2022    9:58 AM 03/11/2022   11:32 AM 11/30/2020    8:10 AM  PHQ 2/9 Scores  PHQ - 2 Score 0 0 0 0 0 0 0  PHQ- 9 Score   4        Fall Risk     01/04/2024    8:59 AM 08/26/2023    8:45 AM 11/27/2022    9:57  AM 08/19/2022    9:56 AM 03/11/2022   11:32 AM  Fall Risk   Falls in the past year? 0 0 1 1 1   Number falls in past yr: 0 0 0 0 0  Injury with Fall? 0 0 1 0 1  Comment   brain concussion    Risk for fall due to : Impaired balance/gait;Impaired mobility  No Fall Risks  Impaired balance/gait  Follow up Falls prevention discussed Falls  evaluation completed  Falls evaluation completed Falls evaluation completed      Data saved with a previous flowsheet row definition    MEDICARE RISK AT HOME:  Medicare Risk at Home Any stairs in or around the home?: No If so, are there any without handrails?: No Home free of loose throw rugs in walkways, pet beds, electrical cords, etc?: Yes Adequate lighting in your home to reduce risk of falls?: Yes Life alert?: No Use of a cane, walker or w/c?: No Grab bars in the bathroom?: Yes Shower chair or bench in shower?: No Elevated toilet seat or a handicapped toilet?: No  TIMED UP AND GO:  Was the test performed?  No  Cognitive Function: 6CIT completed        01/04/2024    9:01 AM 11/30/2020    8:16 AM  6CIT Screen  What Year? 0 points 0 points  What month? 0 points 0 points  What time? 0 points 0 points  Count back from 20 0 points 0 points  Months in reverse 0 points 0 points  Repeat phrase 0 points 0 points  Total Score 0 points 0 points    Immunizations Immunization History  Administered Date(s) Administered   Fluad Quad(high Dose 65+) 12/23/2019, 04/23/2021   Fluad Trivalent(High Dose 65+) 12/30/2022   INFLUENZA, HIGH DOSE SEASONAL PF 01/01/2015, 03/18/2016, 06/17/2017, 12/18/2017, 12/16/2018   PFIZER(Purple Top)SARS-COV-2 Vaccination 04/17/2018, 05/02/2019   Pneumococcal Conjugate-13 08/07/2015   Pneumococcal Polysaccharide-23 07/02/2009   Zoster Recombinant(Shingrix) 02/16/2019, 04/30/2019    Screening Tests Health Maintenance  Topic Date Due   DTaP/Tdap/Td (1 - Tdap) Never done   Influenza Vaccine  10/30/2023   Mammogram  04/29/2024   Medicare Annual Wellness (AWV)  01/03/2025   DEXA SCAN  02/04/2025   Pneumococcal Vaccine: 50+ Years  Completed   Hepatitis C Screening  Completed   Zoster Vaccines- Shingrix  Completed   Meningococcal B Vaccine  Aged Out   Colonoscopy  Discontinued   COVID-19 Vaccine  Discontinued    Health Maintenance Items  Addressed: See Nurse Notes at the end of this note  Additional Screening:  Vision Screening: Recommended annual ophthalmology exams for early detection of glaucoma and other disorders of the eye. Is the patient up to date with their annual eye exam?  Yes  Who is the provider or what is the name of the office in which the patient attends annual eye exams? [facility]   Dental Screening: Recommended annual dental exams for proper oral hygiene  Community Resource Referral / Chronic Care Management: CRR required this visit?  No   CCM required this visit?  No   Plan:    I have personally reviewed and noted the following in the patient's chart:   Medical and social history Use of alcohol, tobacco or illicit drugs  Current medications and supplements including opioid prescriptions. Patient is not currently taking opioid prescriptions. Functional ability and status Nutritional status Physical activity Advanced directives List of other physicians Hospitalizations, surgeries, and ER visits in previous 12 months  Vitals Screenings to include cognitive, depression, and falls Referrals and appointments  In addition, I have reviewed and discussed with patient certain preventive protocols, quality metrics, and best practice recommendations. A written personalized care plan for preventive services as well as general preventive health recommendations were provided to patient.   Ellouise VEAR Haws, LPN   [id number]/05/[id number]   After Visit Summary: (MyChart) Due to this being a telephonic visit, the after visit summary with patients personalized plan was offered to patient via MyChart   Notes: Nothing significant to report at this time.

## 2024-01-04 NOTE — Patient Instructions (Signed)
 Ms. Vanasten,  Thank you for taking the time for your Medicare Wellness Visit. I appreciate your continued commitment to your health goals. Please review the care plan we discussed, and feel free to reach out if I can assist you further.  Medicare recommends these wellness visits once per year to help you and your care team stay ahead of potential health issues. These visits are designed to focus on prevention, allowing your provider to concentrate on managing your acute and chronic conditions during your regular appointments.  Please note that Annual Wellness Visits do not include a physical exam. Some assessments may be limited, especially if the visit was conducted virtually. If needed, we may recommend a separate in-person follow-up with your provider.  Ongoing Care Seeing your primary care provider every 3 to 6 months helps us  monitor your health and provide consistent, personalized care.   Referrals If a referral was made during today's visit and you haven't received any updates within two weeks, please contact the referred provider directly to check on the status.  Recommended Screenings:  Health Maintenance  Topic Date Due   DTaP/Tdap/Td vaccine (1 - Tdap) Never done   Medicare Annual Wellness Visit  11/30/2021   Flu Shot  10/30/2023   Breast Cancer Screening  04/29/2024   DEXA scan (bone density measurement)  02/04/2025   Pneumococcal Vaccine for age over 65  Completed   Hepatitis C Screening  Completed   Zoster (Shingles) Vaccine  Completed   Meningitis B Vaccine  Aged Out   Colon Cancer Screening  Discontinued   COVID-19 Vaccine  Discontinued       08/19/2022    9:56 AM  Advanced Directives  Does Patient Have a Medical Advance Directive? Yes  Type of Estate agent of [place];Living will   Advance Care Planning is important because it: Ensures you receive medical care that aligns with your values, goals, and preferences. Provides guidance to your  family and loved ones, reducing the emotional burden of decision-making during critical moments.  Vision: Annual vision screenings are recommended for early detection of glaucoma, cataracts, and diabetic retinopathy. These exams can also reveal signs of chronic conditions such as diabetes and high blood pressure.  Dental: Annual dental screenings help detect early signs of oral cancer, gum disease, and other conditions linked to overall health, including heart disease and diabetes.  Please see the attached documents for additional preventive care recommendations.

## 2024-01-04 NOTE — Progress Notes (Unsigned)
   LILLETTE Ileana Collet, PhD, LAT, ATC acting as a scribe for Artist Lloyd, MD.  Heather Hodges is a 78 y.o. female who presents to [facility] today for exacerbation of her R knee pain. Pt was last seen by Dr. Lloyd on 06/30/23 and was given a R knee steroid injection  Today, pt reports ***  Dx testing: 12/24/22 R knee XR & labs             03/11/22 R knee XR             11/14/20 R knee XR  Pertinent review of systems: ***  Relevant historical information: ***   Exam:  There were no vitals taken for this visit. General: Well Developed, well nourished, and in no acute distress.   MSK: ***    Lab and Radiology Results No results found for this or any previous visit (from the past 72 hours). No results found.     Assessment and Plan: 78 y.o. female with ***   PDMP not reviewed this encounter. No orders of the defined types were placed in this encounter.  No orders of the defined types were placed in this encounter.    Discussed warning signs or symptoms. Please see discharge instructions. Patient expresses understanding.   ***

## 2024-01-05 ENCOUNTER — Telehealth: Payer: Self-pay

## 2024-01-05 ENCOUNTER — Other Ambulatory Visit: Payer: Self-pay

## 2024-01-05 ENCOUNTER — Ambulatory Visit (INDEPENDENT_AMBULATORY_CARE_PROVIDER_SITE_OTHER)

## 2024-01-05 ENCOUNTER — Encounter: Payer: Self-pay | Admitting: Family Medicine

## 2024-01-05 ENCOUNTER — Ambulatory Visit (INDEPENDENT_AMBULATORY_CARE_PROVIDER_SITE_OTHER): Admitting: Family Medicine

## 2024-01-05 VITALS — BP 112/82 | HR 70 | Ht 60.0 in | Wt 133.0 lb

## 2024-01-05 DIAGNOSIS — M25561 Pain in right knee: Secondary | ICD-10-CM

## 2024-01-05 DIAGNOSIS — G8929 Other chronic pain: Secondary | ICD-10-CM | POA: Diagnosis not present

## 2024-01-05 DIAGNOSIS — M797 Fibromyalgia: Secondary | ICD-10-CM

## 2024-01-05 DIAGNOSIS — R600 Localized edema: Secondary | ICD-10-CM

## 2024-01-05 NOTE — Telephone Encounter (Signed)
No precert required 

## 2024-01-05 NOTE — Telephone Encounter (Signed)
 Please check insurance prior auth requirements for venous reflux R LE.

## 2024-01-05 NOTE — Patient Instructions (Addendum)
 Thank you for coming in today.   You received an injection today. Seek immediate medical attention if the joint becomes red, extremely painful, or is oozing fluid.   Please get an Xray today before you leave   We've placed an order for a venous reflux study, the imaging facility will reach out to assist with scheduling after confirming your insurance benefits.   See you back to review results in person.   Have a great trip!

## 2024-01-08 ENCOUNTER — Ambulatory Visit: Payer: Self-pay | Admitting: Family Medicine

## 2024-01-08 NOTE — Progress Notes (Signed)
 Right knee x-ray shows mild arthritis.

## 2024-01-12 NOTE — Progress Notes (Signed)
 Office Visit Note  Patient: Heather Hodges             Date of Birth: 05/03/1945           MRN: [id number]             PCP: Job Lukes, PA Referring: Job Lukes, PA Visit Date: 01/26/2024 Occupation: Data Unavailable  Subjective:  Pain in joints , generalized pain  History of Present Illness: Heather Hodges is a 78 y.o. female with Sjogren's, osteoarthritis and degenerative disc disease.  She returns today after her last visit in October 2024.  She states she has been experiencing some generalized pain.  She states she had right knee joint cortisone injection by orthopedic surgeon about 1-1/2-week ago.  She has had no relief so far.  She states all of her joints are painful and she is experiencing some generalized pain.  She continues to have dry mouth and dry eyes.  She gives history of morning stiffness and doing routine activities.  She complains of lower back pain persists.  Is generalized achiness from fibromyalgia.    Activities of Daily Living:  Patient reports morning stiffness for various times.   Patient Reports nocturnal pain.  Difficulty dressing/grooming: Denies Difficulty climbing stairs: Reports Difficulty getting out of chair: Reports Difficulty using hands for taps, buttons, cutlery, and/or writing: Reports  Review of Systems  Constitutional:  Positive for fatigue.  HENT:  Positive for mouth sores and mouth dryness.   Eyes:  Negative for dryness.  Respiratory:  Positive for shortness of breath.   Cardiovascular:  Negative for chest pain and palpitations.  Gastrointestinal:  Positive for constipation and diarrhea. Negative for blood in stool.  Endocrine: Positive for increased urination.  Genitourinary:  Negative for involuntary urination.  Musculoskeletal:  Positive for joint pain, gait problem, joint pain, joint swelling, myalgias, muscle weakness, morning stiffness, muscle tenderness and myalgias.  Skin:  Positive for color change, hair loss and  sensitivity to sunlight. Negative for rash.  Allergic/Immunologic: Negative for susceptible to infections.  Neurological:  Positive for dizziness and headaches.  Hematological:  Positive for swollen glands.  Psychiatric/Behavioral:  Positive for sleep disturbance. Negative for depressed mood. The patient is not nervous/anxious.     PMFS History:  Patient Active Problem List   Diagnosis Date Noted   Cervical radiculopathy 01/04/2024   Right knee pain 12/31/2022   Fibromyalgia 02/11/2021   Chronic neck pain 02/11/2021   Osteoporosis    Hypothyroidism    Hyperlipidemia    Diverticulosis    Arthritis    Chronic pain syndrome 02/03/2018   Contusion of left lung 07/20/2017   Cerebral contusion (HCC) 07/17/2017   Traumatic pneumothorax 07/17/2017   Rib fractures 07/16/2017   Vitamin D  deficiency 08/06/2015    Past Medical History:  Diagnosis Date   Arthritis    Cataract    Colon polyps    Diverticulosis    Fibromyalgia    History of chicken pox    Hyperlipidemia    Hypothyroidism    Osteoporosis    Sjogren's disease    Vitamin D  deficiency     Family History  Problem Relation Age of Onset   Arthritis Mother    Breast cancer Mother        16s-70s   High Cholesterol Mother    Dementia Mother    Lung cancer Mother    Heart attack Father    High Cholesterol Father    Dementia Father    Arthritis Brother  Hearing loss Brother    Hypercholesterolemia Brother    Heart Problems Son    Colon cancer Neg Hx    Colon polyps Neg Hx    Esophageal cancer Neg Hx    Rectal cancer Neg Hx    Stomach cancer Neg Hx    Past Surgical History:  Procedure Laterality Date   BREAST BIOPSY Left    Benign   CARPAL TUNNEL RELEASE Bilateral    TONSILLECTOMY AND ADENOIDECTOMY     TRIGGER FINGER RELEASE Right    Thumb   Social History   Tobacco Use   Smoking status: Never    Passive exposure: Past   Smokeless tobacco: Never  Vaping Use   Vaping status: Never Used  Substance  Use Topics   Alcohol use: Not Currently   Drug use: Never   Social History   Social History Narrative   Married   Retired -- statistician and nanny   1 son -- lives in [place]     Immunization History  Administered Date(s) Administered   Fluad Quad(high Dose 65+) 12/23/2019, 04/23/2021   Fluad Trivalent(High Dose 65+) 12/30/2022   INFLUENZA, HIGH DOSE SEASONAL PF 01/01/2015, 03/18/2016, 06/17/2017, 12/18/2017, 12/16/2018   PFIZER(Purple Top)SARS-COV-2 Vaccination 04/17/2018, 05/02/2019   Pneumococcal Conjugate-13 08/07/2015   Pneumococcal Polysaccharide-23 07/02/2009   Zoster Recombinant(Shingrix) 02/16/2019, 04/30/2019     Objective: Vital Signs: BP 130/79   Pulse 72   Temp 97.6 F (36.4 C)   Resp 14   Ht 5' (1.524 m)   Wt 132 lb 9.6 oz (60.1 kg)   BMI 25.90 kg/m    Physical Exam Vitals and nursing note reviewed.  Constitutional:      Appearance: She is well-developed.  HENT:     Head: Normocephalic and atraumatic.  Eyes:     Conjunctiva/sclera: Conjunctivae normal.  Cardiovascular:     Rate and Rhythm: Normal rate and regular rhythm.     Heart sounds: Normal heart sounds.  Pulmonary:     Effort: Pulmonary effort is normal.     Breath sounds: Normal breath sounds.  Abdominal:     General: Bowel sounds are normal.     Palpations: Abdomen is soft.  Musculoskeletal:     Cervical back: Normal range of motion.  Lymphadenopathy:     Cervical: No cervical adenopathy.  Skin:    General: Skin is warm and dry.     Capillary Refill: Capillary refill takes less than 2 seconds.  Neurological:     Mental Status: She is alert and oriented to person, place, and time.  Psychiatric:        Behavior: Behavior normal.      Musculoskeletal Exam: She had very limited lateral rotation of the cervical spine.  She had limited flexion and extension.  Thoracic kyphosis was noted without any tenderness.  She discomfort and limited range of motion of the lumbar spine.   Bilateral shoulder joint abduction was limited to about 100 degrees with discomfort.  She had limited internal rotation.  Elbows and wrist joints in good range of motion.  She had bilateral PIP and DIP thickening.  No synovitis was noted.  Hip joints and knee joints were in good range of motion.  She had discomfort range of motion of the right knee joint without any warmth or swelling.  Baker's cyst was present.  There was no tenderness over her ankles or MTPs.  She had generalized hyperalgesia and positive tender points.  CDAI Exam: CDAI Score: -- Patient Global: --;  Provider Global: -- Swollen: --; Tender: -- Joint Exam 01/26/2024   No joint exam has been documented for this visit   There is currently no information documented on the homunculus. Go to the Rheumatology activity and complete the homunculus joint exam.  Investigation: No additional findings.  Imaging: VAS US  LOWER EXTREMITY VENOUS REFLUX Result Date: 01/20/2024  Lower Venous Reflux Study Patient Name:  Heather Hodges  Date of Exam:   01/20/2024 Medical Rec #: [id number]        Accession #:    [id number] Date of Birth: 08-09-1945        Patient Gender: F Patient Age:   40 years Exam Location:  [address] Procedure:      VAS US  LOWER EXTREMITY VENOUS REFLUX Referring Phys: ARTIST COREY --------------------------------------------------------------------------------  Indications: Swelling.  Performing Technologist: [place] RVT  Examination Guidelines: A complete evaluation includes B-mode imaging, spectral Doppler, color Doppler, and power Doppler as needed of all accessible portions of each vessel. Bilateral testing is considered an integral part of a complete examination. Limited examinations for reoccurring indications may be performed as noted. The reflux portion of the exam is performed with the patient in reverse Trendelenburg. Significant venous reflux is defined as >500 ms in the superficial venous system, and >1  second in the deep venous system.  Venous Reflux Times +--------------+---------+------+-----------+------------+---------------+ RIGHT         Reflux NoRefluxReflux TimeDiameter cmsComments                                Yes                                         +--------------+---------+------+-----------+------------+---------------+ CFV                     yes   >1 second                             +--------------+---------+------+-----------+------------+---------------+ FV mid        no                                                    +--------------+---------+------+-----------+------------+---------------+ Popliteal     no                                                    +--------------+---------+------+-----------+------------+---------------+ GSV at SFJ              yes    >500 ms      0.44                    +--------------+---------+------+-----------+------------+---------------+ GSV prox thighno                            0.23                    +--------------+---------+------+-----------+------------+---------------+ GSV mid thigh no  0.19                    +--------------+---------+------+-----------+------------+---------------+ GSV dist thighno                            0.2                     +--------------+---------+------+-----------+------------+---------------+ GSV at knee   no                            0.17                    +--------------+---------+------+-----------+------------+---------------+ GSV prox calf no                            0.11                    +--------------+---------+------+-----------+------------+---------------+ SSV at [facility]    no                            0.22    thigh extension +--------------+---------+------+-----------+------------+---------------+ SSV prox calf no                            0.17                     +--------------+---------+------+-----------+------------+---------------+ SSV mid calf  no                            0.21                    +--------------+---------+------+-----------+------------+---------------+   Summary: Right: - No evidence of deep vein thrombosis seen in the right lower extremity, from the common femoral through the popliteal veins. - No evidence of superficial venous thrombosis in the right lower extremity. - Deep vein reflux in the CFV. - Superficial vein reflux in the SFJ.  *See table(s) above for measurements and observations. Electronically signed by Penne Colorado MD on 01/20/2024 at 4:41:45 PM.    Final    DG Knee AP/LAT W/Sunrise Right Result Date: 01/07/2024 CLINICAL DATA:  Right knee pain.  Increasing pain with recent falls. EXAM: RIGHT KNEE 3 VIEWS COMPARISON:  Radiograph 12/24/2022 FINDINGS: The alignment and joint spaces are preserved. Tricompartmental peripheral spurring greatest in the patellofemoral compartment. Stable sclerotic density within the posterior distal femur. No fracture. No erosions. No significant joint effusion. No focal soft tissue abnormality. IMPRESSION: 1. No acute fracture or subluxation of the right knee. 2. Mild tricompartmental osteoarthritis. Electronically Signed   By: Andrea Gasman M.D.   On: 01/07/2024 21:00    Recent Labs: Lab Results  Component Value Date   WBC 7.1 08/26/2023   HGB 13.0 08/26/2023   PLT 291.0 08/26/2023   NA 138 08/26/2023   K 4.1 08/26/2023   CL 101 08/26/2023   CO2 30 08/26/2023   GLUCOSE 79 08/26/2023   BUN 8 08/26/2023   CREATININE 0.74 08/26/2023   BILITOT 0.5 08/26/2023   ALKPHOS 59 08/26/2023   AST 21 08/26/2023   ALT 17 08/26/2023   PROT 7.3 08/26/2023   ALBUMIN 4.2 08/26/2023   CALCIUM  9.1 08/26/2023     Speciality Comments:  No specialty comments available.  Procedures:  No procedures performed Allergies: Risedronate sodium   Assessment / Plan:     Visit Diagnoses: Sjogren's  syndrome with keratoconjunctivitis sicca - +ANA, +Ro, RF negative, dry mouth and dry eyes. -She continues to have dry mouth and dry eye symptoms.  Over-the-counter products were discussed.  She denies shortness of breath.  Lungs were clear to auscultation.  Association of ILD and lymphoma with Sjogren's was discussed.  I will obtain following labs today.  Plan: Urinalysis, Routine w reflex microscopic, CBC with Differential/Platelet, Comprehensive metabolic panel with GFR, Anti-DNA antibody, double-stranded, C3 and C4, Sedimentation rate, Sjogrens syndrome-A extractable nuclear antibody, Serum protein electrophoresis with reflex.  Will contact her once the lab results are available.  Polyarthralgia-she complains of discomfort in all of her joints and muscles.  Chondromalacia of both patellae -she continues to have discomfort in her knee joints.  No warmth swelling or effusion was noted.  History of pain in multiple joints for the last 1 year.  X-rays obtained in the past showed chondromalacia patella.  Patient had right knee joint injection by an orthopedic surgeon about 1-1/2 weeks ago.  She had palpable Baker's cyst.  Observation was advised.  Primary osteoarthritis of both hands -she bilateral PIP and DIP thickening.  Joint protection muscle strengthening was discussed.  X-rays obtained in the past showed osteoarthritic changes.  Pain in both feet -she continues to have some discomfort in her feet.  Proper fitting shoes were advised.  X-rays obtained in the past showed osteoarthritic changes.  DDD (degenerative disc disease), cervical-she has limited range of motion of the cervical spine.  Chronic midline low back pain without sciatica - she has chronic lower back pain.  Followed by neurosurgery.  Age-related osteoporosis without current pathological fracture - December 12, 2019 T-score -2.3 left femoral neck. February 05, 2023 T-score -2.6 left femoral neck -6.8%.  DEXA scan results were reviewed  with the patient.  Use of calcium  rich diet and vitamin D  was advised.  Fosamax  70mg  by mouth once weekly.  Vitamin D  deficiency  Chronic pain syndrome-she continues to have generalized pain and discomfort.  Fibromyalgia-she has positive tender points and hyperalgesia.  Need for regular exercise was discussed.  Closed fracture of multiple ribs of left side, sequela  Traumatic pneumothorax, sequela  Diverticulosis  Mixed hyperlipidemia  Acquired hypothyroidism  Orders: Orders Placed This Encounter  Procedures   Urinalysis, Routine w reflex microscopic   CBC with Differential/Platelet   Comprehensive metabolic panel with GFR   Anti-DNA antibody, double-stranded   C3 and C4   Sedimentation rate   Sjogrens syndrome-A extractable nuclear antibody   Serum protein electrophoresis with reflex   No orders of the defined types were placed in this encounter.    Follow-Up Instructions: Return in about 1 year (around 01/25/2025) for Sjogren's, Osteoarthritis, Osteoporosis.   Maya Nash, MD  Note - This record has been created using Animal nutritionist.  Chart creation errors have been sought, but may not always  have been located. Such creation errors do not reflect on  the standard of medical care.

## 2024-01-19 NOTE — Telephone Encounter (Signed)
 Felicia at [facility] needs to know if the test that was ordered for this patient is suppose to be the  DVT test or the reflux test.    Can you please call her back to confirm the order?  [phone]

## 2024-01-19 NOTE — Telephone Encounter (Signed)
 Dr. Joane confirmed order should be for venous reflux.   Called Heather Hodges and advised.   Need to add venous insufficiency.

## 2024-01-19 NOTE — Telephone Encounter (Signed)
 Per visit note 01/05/24:  Assessment and Plan: 78 y.o. female with right knee pain with exacerbation of DJD.  Plan for steroid injection and x-ray today.   Additionally she does have some leg swelling.  Will evaluate this with a venous reflux ultrasound.  DVT risk is quite low.   She does have overall diffuse pain related to fibromyalgia.  This has been treated with multiple attempts in the past.  She is not tolerant of sedating medications like Lyrica or gabapentin .  Watchful waiting for now consider low-dose naltrexone in the future although I am less excited about that.

## 2024-01-20 ENCOUNTER — Ambulatory Visit (HOSPITAL_COMMUNITY)
Admission: RE | Admit: 2024-01-20 | Discharge: 2024-01-20 | Disposition: A | Discharge status: Home / Self Care | Source: Ambulatory Visit | Attending: Family Medicine | Admitting: Family Medicine

## 2024-01-20 DIAGNOSIS — M25561 Pain in right knee: Secondary | ICD-10-CM | POA: Insufficient documentation

## 2024-01-20 DIAGNOSIS — G8929 Other chronic pain: Secondary | ICD-10-CM | POA: Insufficient documentation

## 2024-01-20 DIAGNOSIS — R600 Localized edema: Secondary | ICD-10-CM | POA: Diagnosis present

## 2024-01-21 NOTE — Progress Notes (Signed)
 Lower extremity venous reflux study does show some venous reflux in the leg.  This is where the blood has a hard time getting back up the leg and can make your leg swollen.  Compression stockings can help with this.

## 2024-01-26 ENCOUNTER — Ambulatory Visit: Payer: Medicare Other | Attending: Rheumatology | Admitting: Rheumatology

## 2024-01-26 ENCOUNTER — Encounter: Payer: Self-pay | Admitting: Rheumatology

## 2024-01-26 VITALS — BP 130/79 | HR 72 | Temp 97.6°F | Resp 14 | Ht 60.0 in | Wt 132.6 lb

## 2024-01-26 DIAGNOSIS — M19041 Primary osteoarthritis, right hand: Secondary | ICD-10-CM | POA: Diagnosis not present

## 2024-01-26 DIAGNOSIS — M19042 Primary osteoarthritis, left hand: Secondary | ICD-10-CM | POA: Diagnosis present

## 2024-01-26 DIAGNOSIS — E559 Vitamin D deficiency, unspecified: Secondary | ICD-10-CM | POA: Diagnosis present

## 2024-01-26 DIAGNOSIS — K5790 Diverticulosis of intestine, part unspecified, without perforation or abscess without bleeding: Secondary | ICD-10-CM | POA: Insufficient documentation

## 2024-01-26 DIAGNOSIS — S2242XS Multiple fractures of ribs, left side, sequela: Secondary | ICD-10-CM | POA: Diagnosis present

## 2024-01-26 DIAGNOSIS — M797 Fibromyalgia: Secondary | ICD-10-CM | POA: Insufficient documentation

## 2024-01-26 DIAGNOSIS — S270XXS Traumatic pneumothorax, sequela: Secondary | ICD-10-CM | POA: Diagnosis present

## 2024-01-26 DIAGNOSIS — M5030 Other cervical disc degeneration, unspecified cervical region: Secondary | ICD-10-CM | POA: Insufficient documentation

## 2024-01-26 DIAGNOSIS — M2550 Pain in unspecified joint: Secondary | ICD-10-CM | POA: Diagnosis present

## 2024-01-26 DIAGNOSIS — G894 Chronic pain syndrome: Secondary | ICD-10-CM | POA: Diagnosis present

## 2024-01-26 DIAGNOSIS — M2242 Chondromalacia patellae, left knee: Secondary | ICD-10-CM | POA: Diagnosis present

## 2024-01-26 DIAGNOSIS — M79671 Pain in right foot: Secondary | ICD-10-CM | POA: Diagnosis present

## 2024-01-26 DIAGNOSIS — M545 Low back pain, unspecified: Secondary | ICD-10-CM | POA: Insufficient documentation

## 2024-01-26 DIAGNOSIS — M79672 Pain in left foot: Secondary | ICD-10-CM | POA: Insufficient documentation

## 2024-01-26 DIAGNOSIS — E039 Hypothyroidism, unspecified: Secondary | ICD-10-CM | POA: Diagnosis present

## 2024-01-26 DIAGNOSIS — E782 Mixed hyperlipidemia: Secondary | ICD-10-CM | POA: Insufficient documentation

## 2024-01-26 DIAGNOSIS — M810 Age-related osteoporosis without current pathological fracture: Secondary | ICD-10-CM | POA: Insufficient documentation

## 2024-01-26 DIAGNOSIS — M3501 Sicca syndrome with keratoconjunctivitis: Secondary | ICD-10-CM | POA: Diagnosis not present

## 2024-01-26 DIAGNOSIS — M2241 Chondromalacia patellae, right knee: Secondary | ICD-10-CM | POA: Diagnosis present

## 2024-01-26 DIAGNOSIS — G8929 Other chronic pain: Secondary | ICD-10-CM | POA: Diagnosis present

## 2024-01-28 ENCOUNTER — Ambulatory Visit: Payer: Self-pay | Admitting: Rheumatology

## 2024-01-28 LAB — CBC WITH DIFFERENTIAL/PLATELET
Absolute Lymphocytes: 2631 {cells}/uL (ref 850–3900)
Absolute Monocytes: 556 {cells}/uL (ref 200–950)
Basophils Absolute: 58 {cells}/uL (ref 0–200)
Basophils Relative: 0.7 %
Eosinophils Absolute: 108 {cells}/uL (ref 15–500)
Eosinophils Relative: 1.3 %
HCT: 38.3 % (ref 35.0–45.0)
Hemoglobin: 12.7 g/dL (ref 11.7–15.5)
MCH: 31.1 pg (ref 27.0–33.0)
MCHC: 33.2 g/dL (ref 32.0–36.0)
MCV: 93.6 fL (ref 80.0–100.0)
MPV: 10.8 fL (ref 7.5–12.5)
Monocytes Relative: 6.7 %
Neutro Abs: 4947 {cells}/uL (ref 1500–7800)
Neutrophils Relative %: 59.6 %
Platelets: 264 Thousand/uL (ref 140–400)
RBC: 4.09 Million/uL (ref 3.80–5.10)
RDW: 13.4 % (ref 11.0–15.0)
Total Lymphocyte: 31.7 %
WBC: 8.3 Thousand/uL (ref 3.8–10.8)

## 2024-01-28 LAB — URINALYSIS, ROUTINE W REFLEX MICROSCOPIC
Bacteria, UA: NONE SEEN /HPF
Bilirubin Urine: NEGATIVE
Glucose, UA: NEGATIVE
Hyaline Cast: NONE SEEN /LPF
Ketones, ur: NEGATIVE
Leukocytes,Ua: NEGATIVE
Nitrite: NEGATIVE
Protein, ur: NEGATIVE
RBC / HPF: NONE SEEN /HPF (ref 0–2)
Specific Gravity, Urine: 1.007 (ref 1.001–1.035)
Squamous Epithelial / HPF: NONE SEEN /HPF (ref <=5)
WBC, UA: NONE SEEN /HPF (ref 0–5)
pH: 7 (ref 5.0–8.0)

## 2024-01-28 LAB — COMPREHENSIVE METABOLIC PANEL WITH GFR
AG Ratio: 1.9 (calc) (ref 1.0–2.5)
ALT: 17 U/L (ref 6–29)
AST: 18 U/L (ref 10–35)
Albumin: 4.5 g/dL (ref 3.6–5.1)
Alkaline phosphatase (APISO): 71 U/L (ref 37–153)
BUN: 10 mg/dL (ref 7–25)
CO2: 27 mmol/L (ref 20–32)
Calcium: 9.2 mg/dL (ref 8.6–10.4)
Chloride: 102 mmol/L (ref 98–110)
Creat: 0.74 mg/dL (ref 0.60–1.00)
Globulin: 2.4 g/dL (ref 1.9–3.7)
Glucose, Bld: 83 mg/dL (ref 65–99)
Potassium: 4.6 mmol/L (ref 3.5–5.3)
Sodium: 137 mmol/L (ref 135–146)
Total Bilirubin: 0.5 mg/dL (ref 0.2–1.2)
Total Protein: 6.9 g/dL (ref 6.1–8.1)
eGFR: 82 mL/min/1.73m2 (ref >=60)

## 2024-01-28 LAB — ANTI-DNA ANTIBODY, DOUBLE-STRANDED: ds DNA Ab: <1 [IU]/mL

## 2024-01-28 LAB — C3 AND C4
C3 Complement: 144 mg/dL (ref 83–193)
C4 Complement: 27 mg/dL (ref 15–57)

## 2024-01-28 LAB — PROTEIN ELECTROPHORESIS, SERUM, WITH REFLEX
Albumin ELP: 4.2 g/dL (ref 3.8–4.8)
Alpha 1: 0.3 g/dL (ref 0.2–0.3)
Alpha 2: 0.8 g/dL (ref 0.5–0.9)
Beta 2: 0.4 g/dL (ref 0.2–0.5)
Beta Globulin: 0.4 g/dL (ref 0.4–0.6)
Gamma Globulin: 0.9 g/dL (ref 0.8–1.7)
Total Protein: 7 g/dL (ref 6.1–8.1)

## 2024-01-28 LAB — SJOGRENS SYNDROME-A EXTRACTABLE NUCLEAR ANTIBODY: SSA (Ro) (ENA) Antibody, IgG: <1 AI

## 2024-01-28 LAB — SEDIMENTATION RATE: Sed Rate: 9 mm/h (ref 0–30)

## 2024-01-28 LAB — MICROSCOPIC MESSAGE

## 2024-01-28 NOTE — Progress Notes (Signed)
 All the labs are within normal limits (UA, CBC, CMP, double-stranded ENA, complements, sed rate, SSA).  SPEP is pending.

## 2024-01-29 NOTE — Progress Notes (Signed)
 SPEP  normal.

## 2024-02-29 ENCOUNTER — Ambulatory Visit: Admitting: Physician Assistant

## 2024-03-09 ENCOUNTER — Ambulatory Visit: Admitting: Physician Assistant

## 2024-03-09 ENCOUNTER — Ambulatory Visit: Payer: Self-pay | Admitting: Physician Assistant

## 2024-03-09 ENCOUNTER — Encounter: Payer: Self-pay | Admitting: Physician Assistant

## 2024-03-09 VITALS — BP 110/70 | HR 76 | Temp 97.5°F | Ht 60.0 in | Wt 131.5 lb

## 2024-03-09 DIAGNOSIS — I872 Venous insufficiency (chronic) (peripheral): Secondary | ICD-10-CM | POA: Diagnosis not present

## 2024-03-09 DIAGNOSIS — E039 Hypothyroidism, unspecified: Secondary | ICD-10-CM

## 2024-03-09 DIAGNOSIS — E785 Hyperlipidemia, unspecified: Secondary | ICD-10-CM

## 2024-03-09 DIAGNOSIS — K219 Gastro-esophageal reflux disease without esophagitis: Secondary | ICD-10-CM

## 2024-03-09 DIAGNOSIS — Z23 Encounter for immunization: Secondary | ICD-10-CM

## 2024-03-09 DIAGNOSIS — M2550 Pain in unspecified joint: Secondary | ICD-10-CM

## 2024-03-09 LAB — LIPID PANEL
Cholesterol: 191 mg/dL (ref 0–200)
HDL: 58.7 mg/dL (ref >39.00)
LDL Cholesterol: 87 mg/dL (ref 0–99)
NonHDL: 132.56
Total CHOL/HDL Ratio: 3
Triglycerides: 227 mg/dL — ABNORMAL HIGH (ref 0.0–149.0)
VLDL: 45.4 mg/dL — ABNORMAL HIGH (ref 0.0–40.0)

## 2024-03-09 LAB — TSH: TSH: 10.07 u[IU]/mL — ABNORMAL HIGH (ref 0.35–5.50)

## 2024-03-09 NOTE — Progress Notes (Signed)
 History of Present Illness:   Chief Complaint  Patient presents with   Medical Management of Chronic Issues    Pt here for 6 month f/u Hypothyroidism, Arthritis.    Discussed the use of AI scribe software for clinical note transcription with the patient, who gave verbal consent to proceed.  History of Present Illness   Heather Hodges is a 78 year old female with arthritis and Sjogren's syndrome who presents with generalized pain and swelling.  She has persistent generalized pain and swelling, worse on the right side and leg. Compression stockings are uncomfortable and give little relief. Multiple prior treatments and consultations have not improved symptoms.  She has arthritis and Sjogren's syndrome. A prior pain injection did not help the right-sided pain. She follows with rheumatology and recent evaluations were normal.  She restarted atorvastatin  for cholesterol. She takes an eight-hour arthritis medication twice daily, fish oil, calcium , and vitamin D . She uses famotidine  20 mg daily for reflux and Biotene and sugar-free candies for dry mouth.  She has cold intolerance, feeling cold throughout her body even indoors. Her lips are dry and she has burning in her mouth with certain foods and seasonings.  She still travels but finds it more difficult. She is worried about her reaction time while driving and increasingly relies on her husband for transportation.        Past Medical History:  Diagnosis Date   Arthritis    Cataract    Colon polyps    Diverticulosis    Fibromyalgia    History of chicken pox    Hyperlipidemia    Hypothyroidism    Osteoporosis    Sjogren's disease    Vitamin D  deficiency      Social History   Tobacco Use   Smoking status: Never    Passive exposure: Past   Smokeless tobacco: Never  Vaping Use   Vaping status: Never Used  Substance Use Topics   Alcohol use: Not Currently   Drug use: Never    Past Surgical History:  Procedure  Laterality Date   BREAST BIOPSY Left    Benign   CARPAL TUNNEL RELEASE Bilateral    TONSILLECTOMY AND ADENOIDECTOMY     TRIGGER FINGER RELEASE Right    Thumb    Family History  Problem Relation Age of Onset   Arthritis Mother    Breast cancer Mother        66s-70s   High Cholesterol Mother    Dementia Mother    Lung cancer Mother    Heart attack Father    High Cholesterol Father    Dementia Father    Arthritis Brother    Hearing loss Brother    Hypercholesterolemia Brother    Heart Problems Son    Colon cancer Neg Hx    Colon polyps Neg Hx    Esophageal cancer Neg Hx    Rectal cancer Neg Hx    Stomach cancer Neg Hx     Allergies  Allergen Reactions   Risedronate Sodium Other (See Comments)    TMJ dysfunction    Current Medications:   Current Outpatient Medications:    acetaminophen (TYLENOL 8 HOUR ARTHRITIS PAIN) 650 MG CR tablet, Take 650 mg by mouth every 8 (eight) hours as needed for pain. (Patient taking differently: Take 650 mg by mouth 2 (two) times daily.), Disp: , Rfl:    alendronate  (FOSAMAX ) 70 MG tablet, Take 1 tablet (70 mg total) by mouth every 7 (seven) days., Disp: 12  tablet, Rfl: 11   Artificial Saliva (BIOTENE DRY MOUTH) LOZG, Use as directed in the mouth or throat as needed., Disp: , Rfl:    atorvastatin  (LIPITOR) 80 MG tablet, Take 1 tablet (80 mg total) by mouth daily., Disp: 90 tablet, Rfl: 3   Calcium  Carbonate-Vitamin D  (CALCIUM  500+D PO), Take 2 each by mouth every other day., Disp: , Rfl:    dimenhyDRINATE (DRAMAMINE PO), Take by mouth as needed., Disp: , Rfl:    famotidine  (PEPCID ) 20 MG tablet, TAKE 1 TABLET BY MOUTH EVERY DAY, Disp: 90 tablet, Rfl: 1   ketoconazole  (NIZORAL ) 2 % cream, Apply to affected area 1-2 times daily (Patient taking differently: as needed. Apply to affected area 1-2 times daily), Disp: 15 g, Rfl: 0   levothyroxine  (SYNTHROID ) 75 MCG tablet, Take 1 tablet (75 mcg total) by mouth every other day., Disp: 90 tablet, Rfl:  1   levothyroxine  (SYNTHROID ) 88 MCG tablet, TAKE 1 TABLET (88 MCG TOTAL) BY MOUTH EVERY OTHER DAY., Disp: 15 tablet, Rfl: 0   lidocaine (XYLOCAINE) 4 % external solution, Apply topically., Disp: , Rfl:    Lidocaine 4 % PTCH, Place onto the skin., Disp: , Rfl:    Multiple Vitamin (MULTIVITAMIN) capsule, Take 1 capsule by mouth every other day., Disp: , Rfl:    Omega-3 Fatty Acids (OMEGA 3 500 PO), Take 1 capsule by mouth daily in the afternoon., Disp: , Rfl:    OVER THE COUNTER MEDICATION, Apply topically as needed. Hempvana, Disp: , Rfl:    SYSTANE ULTRA 0.4-0.3 % SOLN, SMARTSIG:1 Drop(s) In Eye(s) PRN, Disp: , Rfl:    Review of Systems:   Negative unless otherwise specified per HPI.  Vitals:   Vitals:   03/09/24 0826  BP: 110/70  Pulse: 76  Temp: (!) 97.5 F (36.4 C)  TempSrc: Temporal  SpO2: 96%  Weight: 131 lb 8 oz (59.6 kg)  Height: 5' (1.524 m)     Body mass index is 25.68 kg/m.  Physical Exam:   Physical Exam Vitals and nursing note reviewed.  Constitutional:      General: She is not in acute distress.    Appearance: She is well-developed. She is not ill-appearing or toxic-appearing.  Cardiovascular:     Rate and Rhythm: Normal rate and regular rhythm.     Pulses: Normal pulses.     Heart sounds: Normal heart sounds, S1 normal and S2 normal.  Pulmonary:     Effort: Pulmonary effort is normal.     Breath sounds: Normal breath sounds.  Skin:    General: Skin is warm and dry.  Neurological:     Mental Status: She is alert.     GCS: GCS eye subscore is 4. GCS verbal subscore is 5. GCS motor subscore is 6.  Psychiatric:        Speech: Speech normal.        Behavior: Behavior normal. Behavior is cooperative.     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 Compression stockings provide limited relief. - Continue use of compression stockings as  tolerated.  Polyarthralgia Chronic polyarthralgia with normal rheumatology evaluation. Pain managed with Tylenol Arthritis and fish oil. - Continue Tylenol Arthritis twice daily. - Continue fish oil supplements. - Continue efforts at moving body  Hyperlipidemia Atorvastatin  restarted after no pain change noted. Monitoring cholesterol levels. - Continue atorvastatin  (Lipitor) 80 mg daily as prescribed. -  Ordered blood work to check cholesterol levels.  Gastroesophageal reflux disease GERD managed with famotidine  20 mg daily. - Continue famotidine  (Pepcid ) 20 mg daily.  Acquired hypothyroidism Cold intolerance reported. Thyroid  function requires monitoring. - Ordered blood work to check thyroid  function.  - Will adjust 75 mcg every other day and 88 mcg on other days  Follow up in 6 months        Lucie Buttner, PA-C

## 2024-03-09 NOTE — Patient Instructions (Signed)
°  VISIT SUMMARY: Today, we discussed your ongoing issues with generalized pain and swelling, particularly in your right leg, as well as your concerns about cold intolerance and dry mouth. We reviewed your current medications and made plans for further monitoring and management.  YOUR PLAN: CHRONIC VENOUS INSUFFICIENCY OF RIGHT LEG: You have chronic venous insufficiency with valve issues causing swelling and discomfort in your right leg. -Continue using compression stockings as much as you can tolerate.  POLYARTHRALGIA: You have chronic joint pain that has been evaluated by rheumatology and is currently managed with Tylenol Arthritis and fish oil. -Continue taking Tylenol Arthritis twice daily. -Continue taking fish oil supplements.  HYPERLIPIDEMIA: Your cholesterol levels are being managed with atorvastatin , which you have recently restarted. -Continue taking atorvastatin  (Lipitor) as prescribed. -We have ordered blood work to check your cholesterol levels.  GASTROESOPHAGEAL REFLUX DISEASE: Your acid reflux is being managed with famotidine . -Continue taking famotidine  (Pepcid ) 20 mg daily.  ACQUIRED HYPOTHYROIDISM: You have reported feeling cold, which may be related to your thyroid  function. -We have ordered blood work to check your thyroid  function.                      Contains text generated by [employer].                                 Contains text generated by [employer].

## 2024-03-10 ENCOUNTER — Telehealth: Payer: Self-pay | Admitting: *Deleted

## 2024-03-10 NOTE — Telephone Encounter (Signed)
See Result notes 

## 2024-03-10 NOTE — Telephone Encounter (Signed)
 Copied from CRM #[id number]. Topic: Clinical - Medication Question >> Mar 10, 2024  8:04 AM Thersia BROCKS wrote: Reason for CRM: Patient called in regarding missed call about her thyroid  medication, stated she is taking it every day  she is doing 19 and 75 every other day rotating them

## 2024-03-11 ENCOUNTER — Other Ambulatory Visit: Payer: Self-pay | Admitting: *Deleted

## 2024-03-11 DIAGNOSIS — E039 Hypothyroidism, unspecified: Secondary | ICD-10-CM

## 2024-03-11 MED ORDER — LEVOTHYROXINE SODIUM 88 MCG PO TABS: 88.0000 ug | ORAL_TABLET | Freq: Every day | ORAL | 1 refills | Status: AC

## 2024-03-26 ENCOUNTER — Other Ambulatory Visit: Payer: Self-pay | Admitting: Nurse Practitioner

## 2024-04-06 ENCOUNTER — Other Ambulatory Visit: Payer: Self-pay | Admitting: Physician Assistant

## 2024-06-01 ENCOUNTER — Ambulatory Visit: Payer: Self-pay

## 2024-06-01 ENCOUNTER — Institutional Professional Consult (permissible substitution): Admitting: Psychology

## 2024-06-08 ENCOUNTER — Encounter: Admitting: Psychology

## 2024-09-07 ENCOUNTER — Ambulatory Visit: Admitting: Physician Assistant

## 2025-01-09 ENCOUNTER — Ambulatory Visit

## 2025-01-25 ENCOUNTER — Ambulatory Visit: Admitting: Rheumatology
